# Patient Record
Sex: Female | Born: 1950 | Race: White | Hispanic: No | Marital: Married | Smoking: Former smoker
Health system: Southern US, Community
[De-identification: ages and names within clinical notes are randomized; demographics above are authoritative.]

## PROBLEM LIST (undated history)

## (undated) DIAGNOSIS — D649 Anemia, unspecified: Secondary | ICD-10-CM

## (undated) DIAGNOSIS — R011 Cardiac murmur, unspecified: Secondary | ICD-10-CM

## (undated) DIAGNOSIS — M1990 Unspecified osteoarthritis, unspecified site: Secondary | ICD-10-CM

## (undated) DIAGNOSIS — N189 Chronic kidney disease, unspecified: Secondary | ICD-10-CM

## (undated) DIAGNOSIS — M8580 Other specified disorders of bone density and structure, unspecified site: Secondary | ICD-10-CM

## (undated) DIAGNOSIS — M810 Age-related osteoporosis without current pathological fracture: Secondary | ICD-10-CM

## (undated) DIAGNOSIS — N390 Urinary tract infection, site not specified: Secondary | ICD-10-CM

## (undated) DIAGNOSIS — F32A Depression, unspecified: Secondary | ICD-10-CM

## (undated) DIAGNOSIS — R413 Other amnesia: Secondary | ICD-10-CM

## (undated) DIAGNOSIS — K219 Gastro-esophageal reflux disease without esophagitis: Secondary | ICD-10-CM

## (undated) DIAGNOSIS — F329 Major depressive disorder, single episode, unspecified: Secondary | ICD-10-CM

## (undated) DIAGNOSIS — I10 Essential (primary) hypertension: Secondary | ICD-10-CM

## (undated) DIAGNOSIS — E785 Hyperlipidemia, unspecified: Secondary | ICD-10-CM

## (undated) DIAGNOSIS — E119 Type 2 diabetes mellitus without complications: Secondary | ICD-10-CM

## (undated) DIAGNOSIS — B379 Candidiasis, unspecified: Secondary | ICD-10-CM

## (undated) HISTORY — PX: SPINE SURGERY: SHX786

## (undated) HISTORY — DX: Major depressive disorder, single episode, unspecified: F32.9

## (undated) HISTORY — DX: Unspecified osteoarthritis, unspecified site: M19.90

## (undated) HISTORY — DX: Anemia, unspecified: D64.9

## (undated) HISTORY — DX: Hypercalcemia: E83.52

## (undated) HISTORY — DX: Cardiac murmur, unspecified: R01.1

## (undated) HISTORY — PX: TONSILLECTOMY: SUR1361

## (undated) HISTORY — DX: Age-related osteoporosis without current pathological fracture: M81.0

## (undated) HISTORY — DX: Other specified disorders of bone density and structure, unspecified site: M85.80

## (undated) HISTORY — DX: Type 2 diabetes mellitus without complications: E11.9

## (undated) HISTORY — DX: Depression, unspecified: F32.A

## (undated) HISTORY — PX: BLADDER SUSPENSION: SHX72

## (undated) HISTORY — DX: Essential (primary) hypertension: I10

## (undated) HISTORY — PX: BACK SURGERY: SHX140

## (undated) HISTORY — DX: Gastro-esophageal reflux disease without esophagitis: K21.9

## (undated) HISTORY — DX: Other amnesia: R41.3

## (undated) HISTORY — DX: Hyperlipidemia, unspecified: E78.5

## (undated) HISTORY — PX: CARPAL TUNNEL RELEASE: SHX101

## (undated) HISTORY — PX: OTHER SURGICAL HISTORY: SHX169

---

## 1998-12-12 ENCOUNTER — Other Ambulatory Visit: Admission: RE | Admit: 1998-12-12 | Discharge: 1998-12-12 | Payer: Self-pay | Admitting: Obstetrics and Gynecology

## 1999-03-31 ENCOUNTER — Encounter (INDEPENDENT_AMBULATORY_CARE_PROVIDER_SITE_OTHER): Payer: Self-pay | Admitting: Specialist

## 1999-03-31 ENCOUNTER — Ambulatory Visit (HOSPITAL_COMMUNITY): Admission: RE | Admit: 1999-03-31 | Discharge: 1999-03-31 | Payer: Self-pay | Admitting: Gastroenterology

## 1999-09-23 ENCOUNTER — Emergency Department (HOSPITAL_COMMUNITY): Admission: EM | Admit: 1999-09-23 | Discharge: 1999-09-23 | Payer: Self-pay | Admitting: Emergency Medicine

## 1999-09-23 ENCOUNTER — Encounter: Payer: Self-pay | Admitting: Emergency Medicine

## 1999-10-13 ENCOUNTER — Ambulatory Visit (HOSPITAL_COMMUNITY): Admission: RE | Admit: 1999-10-13 | Discharge: 1999-10-13 | Payer: Self-pay | Admitting: Orthopedic Surgery

## 1999-10-13 ENCOUNTER — Encounter: Payer: Self-pay | Admitting: Orthopedic Surgery

## 1999-10-19 ENCOUNTER — Ambulatory Visit (HOSPITAL_BASED_OUTPATIENT_CLINIC_OR_DEPARTMENT_OTHER): Admission: RE | Admit: 1999-10-19 | Discharge: 1999-10-19 | Payer: Self-pay | Admitting: Orthopedic Surgery

## 2000-07-09 ENCOUNTER — Emergency Department (HOSPITAL_COMMUNITY): Admission: EM | Admit: 2000-07-09 | Discharge: 2000-07-09 | Payer: Self-pay | Admitting: Emergency Medicine

## 2000-08-16 ENCOUNTER — Encounter: Admission: RE | Admit: 2000-08-16 | Discharge: 2000-08-16 | Payer: Self-pay | Admitting: Obstetrics and Gynecology

## 2000-08-16 ENCOUNTER — Encounter: Payer: Self-pay | Admitting: Obstetrics and Gynecology

## 2000-11-26 ENCOUNTER — Other Ambulatory Visit: Admission: RE | Admit: 2000-11-26 | Discharge: 2000-11-26 | Payer: Self-pay | Admitting: Obstetrics and Gynecology

## 2001-01-29 ENCOUNTER — Encounter: Payer: Self-pay | Admitting: Endocrinology

## 2001-01-29 ENCOUNTER — Ambulatory Visit (HOSPITAL_COMMUNITY): Admission: RE | Admit: 2001-01-29 | Discharge: 2001-01-29 | Payer: Self-pay | Admitting: Endocrinology

## 2001-03-10 ENCOUNTER — Encounter: Admission: RE | Admit: 2001-03-10 | Discharge: 2001-06-08 | Payer: Self-pay | Admitting: Endocrinology

## 2001-11-21 ENCOUNTER — Encounter: Payer: Self-pay | Admitting: Obstetrics and Gynecology

## 2001-11-21 ENCOUNTER — Encounter: Admission: RE | Admit: 2001-11-21 | Discharge: 2001-11-21 | Payer: Self-pay | Admitting: Obstetrics and Gynecology

## 2001-12-22 ENCOUNTER — Other Ambulatory Visit: Admission: RE | Admit: 2001-12-22 | Discharge: 2001-12-22 | Payer: Self-pay | Admitting: Obstetrics and Gynecology

## 2003-02-26 ENCOUNTER — Encounter: Admission: RE | Admit: 2003-02-26 | Discharge: 2003-02-26 | Payer: Self-pay | Admitting: Obstetrics and Gynecology

## 2003-02-26 ENCOUNTER — Encounter: Payer: Self-pay | Admitting: Obstetrics and Gynecology

## 2003-03-03 ENCOUNTER — Other Ambulatory Visit: Admission: RE | Admit: 2003-03-03 | Discharge: 2003-03-03 | Payer: Self-pay | Admitting: Obstetrics and Gynecology

## 2004-05-26 ENCOUNTER — Encounter: Admission: RE | Admit: 2004-05-26 | Discharge: 2004-05-26 | Payer: Self-pay | Admitting: Endocrinology

## 2005-07-26 ENCOUNTER — Encounter: Admission: RE | Admit: 2005-07-26 | Discharge: 2005-07-26 | Payer: Self-pay | Admitting: Endocrinology

## 2006-01-17 ENCOUNTER — Emergency Department (HOSPITAL_COMMUNITY): Admission: EM | Admit: 2006-01-17 | Discharge: 2006-01-17 | Payer: Self-pay | Admitting: Emergency Medicine

## 2007-03-20 ENCOUNTER — Encounter: Admission: RE | Admit: 2007-03-20 | Discharge: 2007-03-20 | Payer: Self-pay | Admitting: Endocrinology

## 2007-10-30 ENCOUNTER — Emergency Department (HOSPITAL_COMMUNITY): Admission: EM | Admit: 2007-10-30 | Discharge: 2007-10-30 | Payer: Self-pay | Admitting: Family Medicine

## 2008-04-28 ENCOUNTER — Encounter: Admission: RE | Admit: 2008-04-28 | Discharge: 2008-04-28 | Payer: Self-pay | Admitting: Obstetrics & Gynecology

## 2009-05-12 ENCOUNTER — Encounter: Admission: RE | Admit: 2009-05-12 | Discharge: 2009-05-12 | Payer: Self-pay | Admitting: Endocrinology

## 2009-09-01 ENCOUNTER — Emergency Department (HOSPITAL_COMMUNITY): Admission: EM | Admit: 2009-09-01 | Discharge: 2009-09-01 | Payer: Self-pay | Admitting: Emergency Medicine

## 2010-05-22 ENCOUNTER — Observation Stay (HOSPITAL_COMMUNITY): Admission: EM | Admit: 2010-05-22 | Discharge: 2010-05-24 | Payer: Self-pay | Admitting: Emergency Medicine

## 2010-05-22 ENCOUNTER — Emergency Department (HOSPITAL_COMMUNITY): Admission: EM | Admit: 2010-05-22 | Discharge: 2010-05-22 | Payer: Self-pay | Admitting: Emergency Medicine

## 2010-06-21 ENCOUNTER — Inpatient Hospital Stay (HOSPITAL_COMMUNITY): Admission: RE | Admit: 2010-06-21 | Discharge: 2010-06-22 | Payer: Self-pay | Admitting: *Deleted

## 2010-07-03 ENCOUNTER — Inpatient Hospital Stay (HOSPITAL_COMMUNITY)
Admission: EM | Admit: 2010-07-03 | Discharge: 2010-07-07 | Payer: Self-pay | Source: Home / Self Care | Attending: Endocrinology | Admitting: Endocrinology

## 2010-08-29 ENCOUNTER — Other Ambulatory Visit: Payer: Self-pay | Admitting: Obstetrics & Gynecology

## 2010-08-29 DIAGNOSIS — Z1231 Encounter for screening mammogram for malignant neoplasm of breast: Secondary | ICD-10-CM

## 2010-09-04 ENCOUNTER — Ambulatory Visit
Admission: RE | Admit: 2010-09-04 | Discharge: 2010-09-04 | Disposition: A | Discharge status: Home / Self Care | Payer: BC Managed Care – PPO | Source: Ambulatory Visit | Attending: Obstetrics & Gynecology | Admitting: Obstetrics & Gynecology

## 2010-09-04 DIAGNOSIS — Z1231 Encounter for screening mammogram for malignant neoplasm of breast: Secondary | ICD-10-CM

## 2010-10-02 LAB — GLUCOSE, CAPILLARY
Glucose-Capillary: 161 mg/dL — ABNORMAL HIGH (ref 70–99)
Glucose-Capillary: 174 mg/dL — ABNORMAL HIGH (ref 70–99)
Glucose-Capillary: 194 mg/dL — ABNORMAL HIGH (ref 70–99)

## 2010-10-02 LAB — CBC
MCH: 28.5 pg (ref 26.0–34.0)
MCV: 86.1 fL (ref 78.0–100.0)
Platelets: 440 10*3/uL — ABNORMAL HIGH (ref 150–400)
Platelets: 501 10*3/uL — ABNORMAL HIGH (ref 150–400)
RBC: 3.61 MIL/uL — ABNORMAL LOW (ref 3.87–5.11)
RDW: 12.7 % (ref 11.5–15.5)
WBC: 6.7 10*3/uL (ref 4.0–10.5)

## 2010-10-02 LAB — COMPREHENSIVE METABOLIC PANEL
ALT: 20 U/L (ref 0–35)
AST: 24 U/L (ref 0–37)
Albumin: 3.5 g/dL (ref 3.5–5.2)
Alkaline Phosphatase: 73 U/L (ref 39–117)
Alkaline Phosphatase: 75 U/L (ref 39–117)
BUN: 11 mg/dL (ref 6–23)
CO2: 23 mEq/L (ref 19–32)
Chloride: 112 mEq/L (ref 96–112)
GFR calc Af Amer: >60 mL/min (ref >60)
GFR calc non Af Amer: >60 mL/min (ref >60)
Glucose, Bld: 144 mg/dL — ABNORMAL HIGH (ref 70–99)
Potassium: 3.5 mEq/L (ref 3.5–5.1)
Potassium: 3.5 mEq/L (ref 3.5–5.1)
Sodium: 141 mEq/L (ref 135–145)
Total Bilirubin: 0.5 mg/dL (ref 0.3–1.2)
Total Protein: 5.8 g/dL — ABNORMAL LOW (ref 6.0–8.3)
Total Protein: 6.2 g/dL (ref 6.0–8.3)

## 2010-10-03 LAB — CBC
HCT: 33.1 % — ABNORMAL LOW (ref 36.0–46.0)
Hemoglobin: 11.2 g/dL — ABNORMAL LOW (ref 12.0–15.0)
MCH: 29 pg (ref 26.0–34.0)
MCHC: 33.6 g/dL (ref 30.0–36.0)
Platelets: 475 10*3/uL — ABNORMAL HIGH (ref 150–400)
RBC: 3.85 MIL/uL — ABNORMAL LOW (ref 3.87–5.11)
RDW: 13 % (ref 11.5–15.5)
WBC: 6.1 10*3/uL (ref 4.0–10.5)

## 2010-10-03 LAB — COMPREHENSIVE METABOLIC PANEL
ALT: 21 U/L (ref 0–35)
AST: 16 U/L (ref 0–37)
AST: 21 U/L (ref 0–37)
Albumin: 3.4 g/dL — ABNORMAL LOW (ref 3.5–5.2)
Alkaline Phosphatase: 66 U/L (ref 39–117)
CO2: 26 mEq/L (ref 19–32)
CO2: 26 mEq/L (ref 19–32)
Calcium: 10.7 mg/dL — ABNORMAL HIGH (ref 8.4–10.5)
Creatinine, Ser: 1.02 mg/dL (ref 0.4–1.2)
GFR calc Af Amer: >60 mL/min (ref >60)
GFR calc Af Amer: >60 mL/min (ref >60)
GFR calc non Af Amer: 55 mL/min — ABNORMAL LOW (ref >60)
GFR calc non Af Amer: 57 mL/min — ABNORMAL LOW (ref >60)
Glucose, Bld: 168 mg/dL — ABNORMAL HIGH (ref 70–99)
Potassium: 3.4 mEq/L — ABNORMAL LOW (ref 3.5–5.1)
Sodium: 139 mEq/L (ref 135–145)
Sodium: 139 mEq/L (ref 135–145)
Total Protein: 5.9 g/dL — ABNORMAL LOW (ref 6.0–8.3)

## 2010-10-03 LAB — URINALYSIS, ROUTINE W REFLEX MICROSCOPIC
Bilirubin Urine: NEGATIVE
Glucose, UA: NEGATIVE mg/dL
Hgb urine dipstick: NEGATIVE
Ketones, ur: 15 mg/dL — AB
Ketones, ur: NEGATIVE mg/dL
Protein, ur: NEGATIVE mg/dL
Specific Gravity, Urine: 1.018 (ref 1.005–1.030)
Urobilinogen, UA: 1 mg/dL (ref 0.0–1.0)
pH: 6.5 (ref 5.0–8.0)

## 2010-10-03 LAB — PTH, INTACT AND CALCIUM: PTH: 64.2 pg/mL (ref 14.0–72.0)

## 2010-10-03 LAB — UIFE/LIGHT CHAINS/TP QN, 24-HR UR
Beta, Urine: DETECTED — AB
Free Lambda Excretion/Day: 0.57 mg/d
Free Lambda Lt Chains,Ur: 0.06 mg/dL — ABNORMAL LOW (ref 0.08–1.01)
Free Lt Chn Excr Rate: 11.12 mg/d
Gamma Globulin, Urine: DETECTED — AB
Time: 24 hours
Total Protein, Urine-Ur/day: 53 mg/d (ref 10–140)
Total Protein, Urine: 5.6 mg/dL

## 2010-10-03 LAB — CK TOTAL AND CKMB (NOT AT ARMC)
Relative Index: INVALID (ref 0.0–2.5)
Total CK: 23 U/L (ref 7–177)

## 2010-10-03 LAB — URINE MICROSCOPIC-ADD ON

## 2010-10-03 LAB — VITAMIN D 25 HYDROXY (VIT D DEFICIENCY, FRACTURES): Vit D, 25-Hydroxy: 44 ng/mL (ref 30–89)

## 2010-10-03 LAB — GLUCOSE, CAPILLARY
Glucose-Capillary: 130 mg/dL — ABNORMAL HIGH (ref 70–99)
Glucose-Capillary: 160 mg/dL — ABNORMAL HIGH (ref 70–99)
Glucose-Capillary: 167 mg/dL — ABNORMAL HIGH (ref 70–99)
Glucose-Capillary: 168 mg/dL — ABNORMAL HIGH (ref 70–99)
Glucose-Capillary: 188 mg/dL — ABNORMAL HIGH (ref 70–99)
Glucose-Capillary: 205 mg/dL — ABNORMAL HIGH (ref 70–99)
Glucose-Capillary: 235 mg/dL — ABNORMAL HIGH (ref 70–99)

## 2010-10-03 LAB — DIFFERENTIAL
Basophils Relative: 0 % (ref 0–1)
Eosinophils Absolute: 0.1 10*3/uL (ref 0.0–0.7)
Eosinophils Relative: 1 % (ref 0–5)
Lymphs Abs: 2 10*3/uL (ref 0.7–4.0)
Monocytes Absolute: 0.5 10*3/uL (ref 0.1–1.0)
Neutrophils Relative %: 58 % (ref 43–77)

## 2010-10-03 LAB — PROTEIN ELECTROPH W RFLX QUANT IMMUNOGLOBULINS
Alpha-1-Globulin: 6.1 % — ABNORMAL HIGH (ref 2.9–4.9)
Alpha-2-Globulin: 12.7 % — ABNORMAL HIGH (ref 7.1–11.8)
Beta 2: 5.4 % (ref 3.2–6.5)
Beta Globulin: 6.2 % (ref 4.7–7.2)
Gamma Globulin: 9 % — ABNORMAL LOW (ref 11.1–18.8)
M-Spike, %: NOT DETECTED g/dL

## 2010-10-03 LAB — VITAMIN D 1,25 DIHYDROXY
Vitamin D2 1, 25 (OH)2: 19 pg/mL
Vitamin D3 1, 25 (OH)2: 42 pg/mL

## 2010-10-03 LAB — RAPID URINE DRUG SCREEN, HOSP PERFORMED
Benzodiazepines: POSITIVE — AB
Cocaine: NOT DETECTED
Tetrahydrocannabinol: NOT DETECTED

## 2010-10-03 LAB — URINE CULTURE
Colony Count: 30000
Culture  Setup Time: 201112130315

## 2010-10-03 LAB — CARDIAC PANEL(CRET KIN+CKTOT+MB+TROPI)
CK, MB: 0.9 ng/mL (ref 0.3–4.0)
CK, MB: 1 ng/mL (ref 0.3–4.0)
Relative Index: INVALID (ref 0.0–2.5)
Total CK: 20 U/L (ref 7–177)
Total CK: 21 U/L (ref 7–177)

## 2010-10-04 LAB — COMPREHENSIVE METABOLIC PANEL
Albumin: 3.7 g/dL (ref 3.5–5.2)
Albumin: 4.3 g/dL (ref 3.5–5.2)
Alkaline Phosphatase: 47 U/L (ref 39–117)
Alkaline Phosphatase: 61 U/L (ref 39–117)
BUN: 12 mg/dL (ref 6–23)
BUN: 12 mg/dL (ref 6–23)
BUN: 14 mg/dL (ref 6–23)
CO2: 23 mEq/L (ref 19–32)
Creatinine, Ser: 1.02 mg/dL (ref 0.4–1.2)
Creatinine, Ser: 1.05 mg/dL (ref 0.4–1.2)
GFR calc Af Amer: >60 mL/min (ref >60)
GFR calc non Af Amer: 53 mL/min — ABNORMAL LOW (ref >60)
Glucose, Bld: 142 mg/dL — ABNORMAL HIGH (ref 70–99)
Potassium: 3.8 mEq/L (ref 3.5–5.1)
Potassium: 4.1 mEq/L (ref 3.5–5.1)
Total Bilirubin: 0.6 mg/dL (ref 0.3–1.2)
Total Bilirubin: 0.7 mg/dL (ref 0.3–1.2)
Total Protein: 6 g/dL (ref 6.0–8.3)
Total Protein: 6.9 g/dL (ref 6.0–8.3)
Total Protein: 7 g/dL (ref 6.0–8.3)

## 2010-10-04 LAB — CBC
HCT: 40.7 % (ref 36.0–46.0)
MCH: 29.4 pg (ref 26.0–34.0)
MCV: 85.1 fL (ref 78.0–100.0)
MCV: 85.5 fL (ref 78.0–100.0)
MCV: 86.8 fL (ref 78.0–100.0)
Platelets: 343 10*3/uL (ref 150–400)
Platelets: 364 10*3/uL (ref 150–400)
RDW: 12.4 % (ref 11.5–15.5)
RDW: 12.7 % (ref 11.5–15.5)
RDW: 12.7 % (ref 11.5–15.5)
WBC: 7.5 10*3/uL (ref 4.0–10.5)
WBC: 8.4 10*3/uL (ref 4.0–10.5)

## 2010-10-04 LAB — GLUCOSE, CAPILLARY
Glucose-Capillary: 129 mg/dL — ABNORMAL HIGH (ref 70–99)
Glucose-Capillary: 153 mg/dL — ABNORMAL HIGH (ref 70–99)
Glucose-Capillary: 212 mg/dL — ABNORMAL HIGH (ref 70–99)
Glucose-Capillary: 223 mg/dL — ABNORMAL HIGH (ref 70–99)
Glucose-Capillary: 85 mg/dL (ref 70–99)

## 2010-10-04 LAB — POCT I-STAT 4, (NA,K, GLUC, HGB,HCT)
Potassium: 3.9 mEq/L (ref 3.5–5.1)
Sodium: 142 mEq/L (ref 135–145)

## 2010-10-04 LAB — DIFFERENTIAL
Basophils Absolute: 0 10*3/uL (ref 0.0–0.1)
Basophils Absolute: 0 10*3/uL (ref 0.0–0.1)
Basophils Relative: 0 % (ref 0–1)
Lymphocytes Relative: 34 % (ref 12–46)
Monocytes Absolute: 0.3 10*3/uL (ref 0.1–1.0)
Monocytes Relative: 5 % (ref 3–12)
Monocytes Relative: 8 % (ref 3–12)
Neutro Abs: 3.5 10*3/uL (ref 1.7–7.7)
Neutro Abs: 5.2 10*3/uL (ref 1.7–7.7)
Neutrophils Relative %: 69 % (ref 43–77)

## 2010-10-04 LAB — URINALYSIS, ROUTINE W REFLEX MICROSCOPIC
Hgb urine dipstick: NEGATIVE
Ketones, ur: NEGATIVE mg/dL
Nitrite: NEGATIVE
Protein, ur: NEGATIVE mg/dL
Specific Gravity, Urine: 1.007 (ref 1.005–1.030)
Urobilinogen, UA: 0.2 mg/dL (ref 0.0–1.0)
Urobilinogen, UA: 0.2 mg/dL (ref 0.0–1.0)
pH: 6 (ref 5.0–8.0)

## 2010-10-04 LAB — CULTURE, BLOOD (ROUTINE X 2)
Culture  Setup Time: 201110312300
Culture  Setup Time: 201110312300

## 2010-10-04 LAB — URINE MICROSCOPIC-ADD ON

## 2010-10-04 LAB — APTT: aPTT: 28 seconds (ref 24–37)

## 2010-10-04 LAB — TYPE AND SCREEN: ABO/RH(D): O NEG

## 2010-10-04 LAB — RAPID URINE DRUG SCREEN, HOSP PERFORMED
Amphetamines: NOT DETECTED
Barbiturates: NOT DETECTED
Benzodiazepines: POSITIVE — AB
Opiates: NOT DETECTED

## 2010-10-04 LAB — URINE CULTURE: Colony Count: NO GROWTH

## 2010-10-04 LAB — SURGICAL PCR SCREEN: Staphylococcus aureus: NEGATIVE

## 2010-10-04 LAB — AMMONIA: Ammonia: 42 umol/L — ABNORMAL HIGH (ref 11–35)

## 2010-10-04 LAB — POCT CARDIAC MARKERS: Troponin i, poc: <0.05 ng/mL (ref 0.00–0.09)

## 2010-12-08 NOTE — Op Note (Signed)
[place]  Patient:    Casey Jennings, Casey Jennings                     MRN: [id number] Proc. Date: 10/19/99 Attending:  Josephine Cables., M.D.                           Operative Report  INDICATIONS:  This patient is a 59 year old who had a slightly displaced fibula distal tibia fracture that on followup examination some two-and-a-half to three weeks after initial contact with the patient showed enough displacement to merit open reduction, possibly requiring one night in a hospital setting.  PREOPERATIVE DIAGNOSIS:  Displaced distal tibial fracture and lateral malleus fracture.  POSTOPERATIVE DIAGNOSIS:  Displaced distal tibial fracture and lateral malleus fracture.  PROCEDURE: 1. Open reduction internal fixation lateral malleus. 2. Open reduction internal fixation of distal tibia.  SURGEON:  Josephine Cables., M.D.  ANESTHESIA:  General.  TOURNIQUET TIME:  Approximately one hour ten minutes.  DESCRIPTION OF PROCEDURE:  The patient had sterile prep and drape, ______ . Placed the tourniquet at 375 mmHg.  A lateral incision was made slightly posterior to the normal fibular incision to allow approach or at least inspection of the posterior aspect of the tibia.  The fracture had some early callous formation on the fibular side.  This was gently debrided followed by provisional reduction.  On provisional reduction, the slight superior inferior displacement of the posterior tibial fracture corrected nicely; therefore, through a separate small stab wound along the anterior lateral aspect of the tibia, a cancellous 4.0 lag screw was placed from front to back to compress the separation between the distal tibia fragments.  It was obvious on the fluoroscopy that the compression had occurred, and the end of the screw was not prominent posteriorly, but it did engage the posterior cortex and compress the posterior aspect of the tibia nicely.   Thereafter, a six-hole plate was fashioned using the Ace system, and again, a 4.0 Ace titanium screw was used, three screws proximally, but well, only one screw distally could be placed not entering the fracture site.  A cancellous screw was placed which bit well distally in view of the fact that there was a long posterior fragment.  A lag screw was placed from the proximal to the distal fracture site of the fibula to create a lag screw affect through the fracture site.  This compressed this area nicely.  Mortise was well reduced on the AP lateral and mortise views. Basically, an anatomic reduction.  The wound was irrigated and closed, the stab wound with nylon, the lateral incision with #0, 2-0 Vicryl and skin clips.  Lightly compressive sterile dressing applied with a ______ neutral. Back to recovery with stable conditions. DD:  10/19/99 TD:  10/20/99 Job: [id number]

## 2011-05-31 ENCOUNTER — Ambulatory Visit (HOSPITAL_COMMUNITY)
Admission: RE | Admit: 2011-05-31 | Discharge: 2011-05-31 | Disposition: A | Discharge status: Home / Self Care | Payer: BC Managed Care – PPO | Source: Ambulatory Visit | Attending: Endocrinology | Admitting: Endocrinology

## 2011-05-31 ENCOUNTER — Other Ambulatory Visit: Payer: Self-pay | Admitting: Endocrinology

## 2011-05-31 ENCOUNTER — Other Ambulatory Visit (HOSPITAL_COMMUNITY): Payer: Self-pay | Admitting: Endocrinology

## 2011-05-31 DIAGNOSIS — R42 Dizziness and giddiness: Secondary | ICD-10-CM

## 2011-05-31 MED ORDER — GADOBENATE DIMEGLUMINE 529 MG/ML IV SOLN
16.0000 mL | Freq: Once | INTRAVENOUS | Status: AC | PRN
  Administered 2011-05-31: 16 mL via INTRAVENOUS

## 2011-06-19 ENCOUNTER — Ambulatory Visit: Payer: BC Managed Care – PPO | Attending: Neurology | Admitting: Physical Therapy

## 2011-06-19 DIAGNOSIS — IMO0001 Reserved for inherently not codable concepts without codable children: Secondary | ICD-10-CM | POA: Insufficient documentation

## 2011-06-19 DIAGNOSIS — H8110 Benign paroxysmal vertigo, unspecified ear: Secondary | ICD-10-CM | POA: Insufficient documentation

## 2011-06-25 ENCOUNTER — Encounter: Payer: BC Managed Care – PPO | Admitting: Physical Therapy

## 2011-06-27 ENCOUNTER — Ambulatory Visit: Payer: BC Managed Care – PPO | Attending: Neurology | Admitting: Physical Therapy

## 2011-06-27 DIAGNOSIS — IMO0001 Reserved for inherently not codable concepts without codable children: Secondary | ICD-10-CM | POA: Insufficient documentation

## 2011-06-27 DIAGNOSIS — H8110 Benign paroxysmal vertigo, unspecified ear: Secondary | ICD-10-CM | POA: Insufficient documentation

## 2011-06-29 ENCOUNTER — Ambulatory Visit: Payer: BC Managed Care – PPO | Admitting: Physical Therapy

## 2011-07-04 ENCOUNTER — Encounter: Payer: BC Managed Care – PPO | Admitting: Physical Therapy

## 2011-07-06 ENCOUNTER — Ambulatory Visit: Payer: BC Managed Care – PPO | Admitting: Physical Therapy

## 2011-07-09 ENCOUNTER — Ambulatory Visit: Payer: BC Managed Care – PPO | Admitting: Physical Therapy

## 2011-07-10 ENCOUNTER — Encounter: Payer: BC Managed Care – PPO | Admitting: Physical Therapy

## 2011-07-13 ENCOUNTER — Encounter: Payer: BC Managed Care – PPO | Admitting: Physical Therapy

## 2011-07-16 ENCOUNTER — Encounter: Payer: BC Managed Care – PPO | Admitting: Physical Therapy

## 2011-07-18 ENCOUNTER — Encounter: Payer: BC Managed Care – PPO | Admitting: Physical Therapy

## 2011-07-27 ENCOUNTER — Encounter: Payer: BC Managed Care – PPO | Admitting: Physical Therapy

## 2011-07-30 ENCOUNTER — Encounter: Payer: BC Managed Care – PPO | Admitting: Physical Therapy

## 2011-08-02 ENCOUNTER — Encounter: Payer: BC Managed Care – PPO | Admitting: Physical Therapy

## 2011-08-07 ENCOUNTER — Encounter: Payer: BC Managed Care – PPO | Admitting: Physical Therapy

## 2011-08-09 ENCOUNTER — Encounter: Payer: BC Managed Care – PPO | Admitting: Physical Therapy

## 2011-08-14 ENCOUNTER — Other Ambulatory Visit: Payer: Self-pay | Admitting: Endocrinology

## 2011-08-14 DIAGNOSIS — C73 Malignant neoplasm of thyroid gland: Secondary | ICD-10-CM

## 2011-08-23 ENCOUNTER — Other Ambulatory Visit: Payer: BC Managed Care – PPO

## 2011-10-25 ENCOUNTER — Other Ambulatory Visit: Payer: Self-pay | Admitting: Obstetrics & Gynecology

## 2011-10-25 DIAGNOSIS — Z1231 Encounter for screening mammogram for malignant neoplasm of breast: Secondary | ICD-10-CM

## 2011-10-31 ENCOUNTER — Ambulatory Visit: Payer: BC Managed Care – PPO

## 2011-10-31 ENCOUNTER — Other Ambulatory Visit: Payer: BC Managed Care – PPO

## 2011-11-09 ENCOUNTER — Ambulatory Visit
Admission: RE | Admit: 2011-11-09 | Discharge: 2011-11-09 | Disposition: A | Discharge status: Home / Self Care | Payer: BC Managed Care – PPO | Source: Ambulatory Visit | Attending: Obstetrics & Gynecology | Admitting: Obstetrics & Gynecology

## 2011-11-09 DIAGNOSIS — Z1231 Encounter for screening mammogram for malignant neoplasm of breast: Secondary | ICD-10-CM

## 2011-11-12 ENCOUNTER — Other Ambulatory Visit: Payer: Self-pay | Admitting: Obstetrics & Gynecology

## 2011-11-12 DIAGNOSIS — R928 Other abnormal and inconclusive findings on diagnostic imaging of breast: Secondary | ICD-10-CM

## 2011-11-14 ENCOUNTER — Ambulatory Visit
Admission: RE | Admit: 2011-11-14 | Discharge: 2011-11-14 | Disposition: A | Discharge status: Home / Self Care | Payer: BC Managed Care – PPO | Source: Ambulatory Visit | Attending: Obstetrics & Gynecology | Admitting: Obstetrics & Gynecology

## 2011-11-14 DIAGNOSIS — R928 Other abnormal and inconclusive findings on diagnostic imaging of breast: Secondary | ICD-10-CM

## 2012-04-04 ENCOUNTER — Other Ambulatory Visit (HOSPITAL_COMMUNITY): Payer: Self-pay | Admitting: Endocrinology

## 2012-04-04 DIAGNOSIS — E349 Endocrine disorder, unspecified: Secondary | ICD-10-CM

## 2012-04-14 ENCOUNTER — Encounter (HOSPITAL_COMMUNITY)
Admission: RE | Admit: 2012-04-14 | Discharge: 2012-04-14 | Disposition: A | Discharge status: Home / Self Care | Payer: BC Managed Care – PPO | Source: Ambulatory Visit | Attending: Endocrinology | Admitting: Endocrinology

## 2012-04-14 DIAGNOSIS — E213 Hyperparathyroidism, unspecified: Secondary | ICD-10-CM | POA: Insufficient documentation

## 2012-04-14 DIAGNOSIS — E349 Endocrine disorder, unspecified: Secondary | ICD-10-CM

## 2012-04-14 MED ORDER — TECHNETIUM TC 99M SESTAMIBI GENERIC - CARDIOLITE
25.3000 | Freq: Once | INTRAVENOUS | Status: AC | PRN
  Administered 2012-04-14: 25 via INTRAVENOUS

## 2012-05-07 ENCOUNTER — Encounter (INDEPENDENT_AMBULATORY_CARE_PROVIDER_SITE_OTHER): Payer: Self-pay | Admitting: Surgery

## 2012-05-12 ENCOUNTER — Encounter (INDEPENDENT_AMBULATORY_CARE_PROVIDER_SITE_OTHER): Payer: Self-pay | Admitting: Surgery

## 2012-05-12 ENCOUNTER — Ambulatory Visit (INDEPENDENT_AMBULATORY_CARE_PROVIDER_SITE_OTHER): Payer: BC Managed Care – PPO | Admitting: Surgery

## 2012-05-12 ENCOUNTER — Telehealth (INDEPENDENT_AMBULATORY_CARE_PROVIDER_SITE_OTHER): Payer: Self-pay | Admitting: General Surgery

## 2012-05-12 VITALS — BP 122/66 | HR 108 | Temp 97.6°F | Resp 16 | Ht 64.5 in | Wt 149.6 lb

## 2012-05-12 DIAGNOSIS — E210 Primary hyperparathyroidism: Secondary | ICD-10-CM

## 2012-05-12 NOTE — Progress Notes (Signed)
General Surgery [facility]  Chief Complaint  Patient presents with  . New Evaluation    eval parathyroid adenoma - referral from Dr. Carrolyn Meiers    HISTORY: Patient is a 61 year old white female referred by her endocrinologist with hypercalcemia and primary hyperparathyroidism. Patient has had problems dating back to 2011 following low back surgery. She developed memory loss and significant hypertension. She has had episodes of hypercalcemia with significant confusion requiring hospitalization. Workup has shown calcium levels to exceed 12.0. Recent levels were elevated at 10.7. Intact PTH level was elevated at 102.  Patient underwent nuclear medicine parathyroid scan in September 2013. This localizes a parathyroid adenoma to the left inferior position. Patient is referred at this time for consideration for parathyroidectomy.  Patient has had no prior head or neck surgery with the exception of tonsillectomy. She denies any other significant endocrinopathy except for diabetes. There is no family history of parathyroid disease or other endocrinopathy.  Past Medical History  Diagnosis Date  . Hyperlipidemia   . Hypertension   . GERD (gastroesophageal reflux disease)   . Anemia   . Hypercalcemia   . Diabetes mellitus without complication   . Depression   . Osteopenia   . Arthritis   . Heart murmur   . Osteoporosis      Current Outpatient Prescriptions  Medication Sig Dispense Refill  . aspirin 81 MG tablet Take 81 mg by mouth daily.      Marland Kitchen atorvastatin (LIPITOR) 80 MG tablet Take 80 mg by mouth daily.      . Choline Fenofibrate (TRILIPIX) 135 MG capsule Take 135 mg by mouth daily.      Marland Kitchen dexlansoprazole (DEXILANT) 60 MG capsule Take 60 mg by mouth daily.      . DULoxetine (CYMBALTA) 60 MG capsule Take 60 mg by mouth daily.      Marland Kitchen estradiol (VAGIFEM) 25 MCG vaginal tablet Place 25 mcg vaginally daily.      . hydrALAZINE (APRESOLINE) 50 MG tablet Take 50 mg by  mouth 3 (three) times daily.      Marland Kitchen linagliptin (TRADJENTA) 5 MG TABS tablet Take 5 mg by mouth daily.      . metoCLOPramide (REGLAN) 10 MG tablet Take 10 mg by mouth at bedtime.      . Multiple Vitamins-Minerals (MULTIVITAMIN WITH MINERALS) tablet Take 1 tablet by mouth daily.      . risedronate (ACTONEL) 150 MG tablet Take 150 mg by mouth every 30 (thirty) days. with water on empty stomach, nothing by mouth or lie down for next 30 minutes.         Allergies  Allergen Reactions  . Codeine     hallucinations     Family History  Problem Relation Age of Onset  . Diabetes Mother      History   Social History  . Marital Status: Married    Spouse Name: N/A    Number of Children: N/A  . Years of Education: N/A   Social History Main Topics  . Smoking status: Former Smoker    Quit date: 05/07/1978  . Smokeless tobacco: None  . Alcohol Use: Yes  . Drug Use: No  . Sexually Active: None   Other Topics Concern  . None   Social History Narrative  . None     REVIEW OF SYSTEMS - PERTINENT POSITIVES ONLY: Patient notes chronic fatigue. She has problems with memory. She frequently has significant confusion. She has speech difficulties. She has been documented  to have osteopenia by bone density scanning. She denies nephrolithiasis.  EXAM: Filed Vitals:   05/12/12 1403  BP: 122/66  Pulse: 108  Temp: 97.6 F (36.4 C)  Resp: 16    HEENT: normocephalic; pupils equal and reactive; sclerae clear; dentition good; mucous membranes moist NECK:  symmetric on extension; no palpable anterior or posterior cervical lymphadenopathy; no supraclavicular masses; no tenderness CHEST: clear to auscultation bilaterally without rales, rhonchi, or wheezes CARDIAC: regular rate and rhythm without significant murmur; peripheral pulses are full EXT:  non-tender without edema; no deformity NEURO: no gross focal deficits; no sign of tremor   LABORATORY RESULTS: See [facility] (CHL-Epic) for  most recent results   RADIOLOGY RESULTS: See [facility] (CHL-Epic) for most recent results   IMPRESSION: #1 primary hyperparathyroidism, 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 I have provided her with written literature on parathyroid disease to review. The nuclear medicine parathyroid scan localizes in adenoma to the left inferior position. I have recommended minimally invasive parathyroidectomy as an outpatient surgical procedure. We discussed the location of the surgical incision, the hospitalization, and the postoperative recovery to be anticipated. We discussed potential complications including the potential for a second gland adenoma and the possible need for additional surgery. The patient understands and wishes to proceed. We will make arrangements for surgery at a time convenient for her in the near future.  The risks and benefits of the procedure have been discussed at length with the patient.  The patient understands the proposed procedure, potential alternative treatments, and the course of recovery to be expected.  All of the patient's questions have been answered at this time.  The patient wishes to proceed with surgery.  Earnstine Regal, MD, [facility]   Visit Diagnoses: 1. Hyperparathyroidism, primary     Primary Care Physician: Sheela Stack, MD

## 2012-05-12 NOTE — Telephone Encounter (Signed)
Called and left message on patient's home and cell phone to try to advise of post op appointment set up to see Dr. Harlow Asa. Patient set up for 06/12/12 at 2:45. Sx scheduled for 05/27/12.

## 2012-05-12 NOTE — Patient Instructions (Addendum)
[facility], [place] [phone]  THYROID & PARATHYROID SURGERY -- POST OP INSTRUCTIONS  Always review your discharge instruction sheet from the facility where your surgery was performed.  1. A prescription for pain medication may be given to you upon discharge.  Take your pain medication as prescribed.  If narcotic pain medicine is not needed, then you may take acetaminophen (Tylenol) or ibuprofen (Advil) as needed. 2. Take your usually prescribed medications unless otherwise directed. 3. If you need a refill on your pain medication, please contact your pharmacy. They will contact our office to request authorization.  Prescriptions will not be processed after 5 pm or on weekends. 4. Start with a light diet upon arrival home, such as soup and crackers or toast.  Be sure to drink plenty of fluids daily.  Resume your normal diet the day after surgery. 5. Most patients will experience some swelling and bruising on the chest and neck area.  Ice packs will help.  Swelling and bruising can take several days to resolve.  6. It is common to experience some constipation if taking pain medication after surgery.  Increasing fluid intake and taking a stool softener will usually help or prevent this problem.  A mild laxative (Milk of Magnesia or Miralax) should be taken according to package directions if there are no bowel movements after 48 hours. 7. You may remove your bandages 24-48 hours after surgery, and you may shower at that time.  You have steri-strips (small skin tapes) in place directly over the incision.  These strips should be left on the skin for 7-10 days and then removed. 8. You may resume regular (light) daily activities beginning the next day-such as daily self-care, walking, climbing stairs-gradually increasing activities as tolerated.  You may have sexual intercourse when it is comfortable.  Refrain from any heavy lifting or straining until approved by your doctor.  You may drive when  you no longer are taking prescription pain medication, you can comfortably wear a seatbelt, and you can safely maneuver your car and apply brakes. 9. You should see your doctor in the office for a follow-up appointment approximately two to three weeks after your surgery.  Make sure that you call for this appointment within a day or two after you arrive home to insure a convenient appointment time.  WHEN TO CALL YOUR DOCTOR: 1. Fever greater than 101.5 2. Inability to urinate 3. Nausea and/or vomiting - persistent 4. Extreme swelling or bruising 5. Continued bleeding from incision 6. Increased pain, redness, or drainage from the incision 7. Difficulty swallowing or breathing 8. Muscle cramping or spasms 9. Numbness or tingling in hands or around lips  The clinic staff is available to answer your questions during regular business hours.  Please don't hesitate to call and ask to speak to one of the nurses if you have concerns. [facility], [place]  [email or web address]

## 2012-05-14 NOTE — Telephone Encounter (Signed)
Patient called back. Made her aware of appt. She will call with any questions.

## 2012-05-20 NOTE — Patient Instructions (Signed)
20 Casey Jennings  05/20/2012   Your procedure is scheduled on: 05/27/12 0930am-1100am  Report to [facility] at 0700 AM.  Call this number if you have problems the morning of surgery: [phone]   Remember:   Do not eat food:After Midnight.  May have clear liquids:until Midnight .    Take these medicines the morning of surgery with A SIP OF WATER:    Do not wear jewelry, make-up or nail polish.  Do not wear lotions, powders, or perfumes. .  Do not shave 48 hours prior to surgery.   Do not bring valuables to the hospital.  Contacts, dentures or bridgework may not be worn into surgery.  .   Patients discharged the day of surgery will not be allowed to drive home.  Name and phone number of your driver             SEE CHG INSTRUCTION SHEET    Please read over the following fact sheets that you were given: MRSA Information, coughing and deep breathing exercises, leg exercises

## 2012-05-21 ENCOUNTER — Encounter (HOSPITAL_COMMUNITY): Payer: Self-pay | Admitting: Pharmacy Technician

## 2012-05-23 ENCOUNTER — Encounter (HOSPITAL_COMMUNITY): Payer: Self-pay

## 2012-05-23 ENCOUNTER — Encounter (HOSPITAL_COMMUNITY)
Admission: RE | Admit: 2012-05-23 | Discharge: 2012-05-23 | Disposition: A | Discharge status: Home / Self Care | Payer: BC Managed Care – PPO | Source: Ambulatory Visit | Attending: Surgery | Admitting: Surgery

## 2012-05-23 ENCOUNTER — Ambulatory Visit (HOSPITAL_COMMUNITY)
Admission: RE | Admit: 2012-05-23 | Discharge: 2012-05-23 | Disposition: A | Discharge status: Home / Self Care | Payer: BC Managed Care – PPO | Source: Ambulatory Visit | Attending: Surgery | Admitting: Surgery

## 2012-05-23 DIAGNOSIS — Z01818 Encounter for other preprocedural examination: Secondary | ICD-10-CM | POA: Insufficient documentation

## 2012-05-23 HISTORY — DX: Chronic kidney disease, unspecified: N18.9

## 2012-05-23 HISTORY — DX: Candidiasis, unspecified: B37.9

## 2012-05-23 LAB — BASIC METABOLIC PANEL
Chloride: 107 mEq/L (ref 96–112)
GFR calc Af Amer: 68 mL/min — ABNORMAL LOW (ref >90)
GFR calc non Af Amer: 59 mL/min — ABNORMAL LOW (ref >90)
Potassium: 3.5 mEq/L (ref 3.5–5.1)
Sodium: 140 mEq/L (ref 135–145)

## 2012-05-23 LAB — CBC
HCT: 33.6 % — ABNORMAL LOW (ref 36.0–46.0)
Hemoglobin: 11.2 g/dL — ABNORMAL LOW (ref 12.0–15.0)
RBC: 3.87 MIL/uL (ref 3.87–5.11)

## 2012-05-23 LAB — SURGICAL PCR SCREEN: MRSA, PCR: NEGATIVE

## 2012-05-26 IMAGING — CT CT HEAD W/O CM
1 series · 16 of 30 positions shown, 20 images · non-contrast
Comparison: None

CLINICAL DATA: Altered mental status.  Headache.

CT HEAD WITHOUT CONTRAST
TECHNIQUE: Contiguous axial images were obtained from the base of
the skull through the vertex without contrast.

[Series 2: (id) head 4.8 h37s st · axial · 0.49mm/px · z∈[-33,+105]mm · 16 of 30 slices shown, 20 images]
[im 2/30  brain]
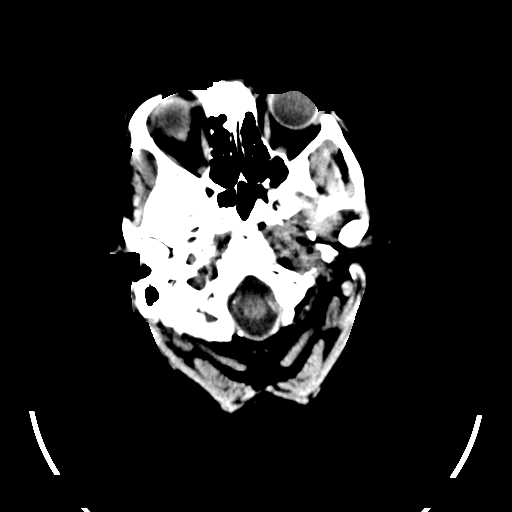
[im 2/30  bone]
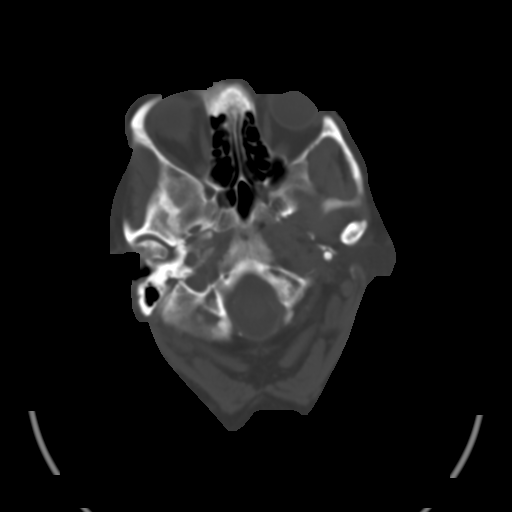
[im 4/30  brain]
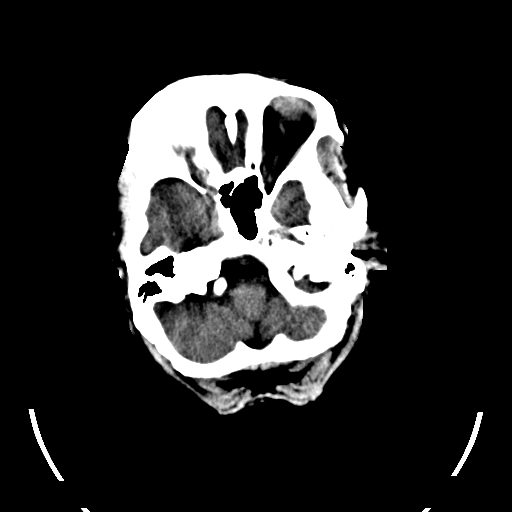
[im 6/30  brain]
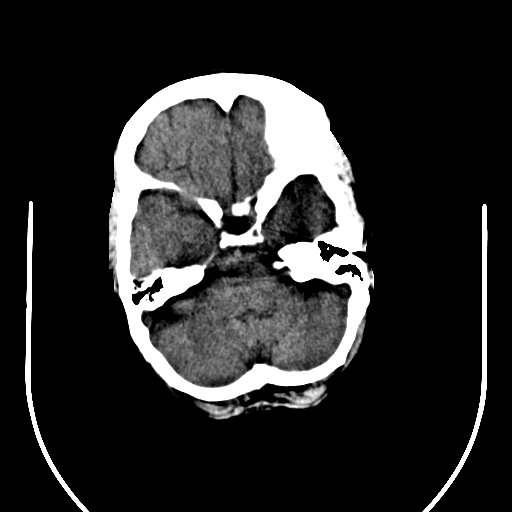
[im 8/30  brain]
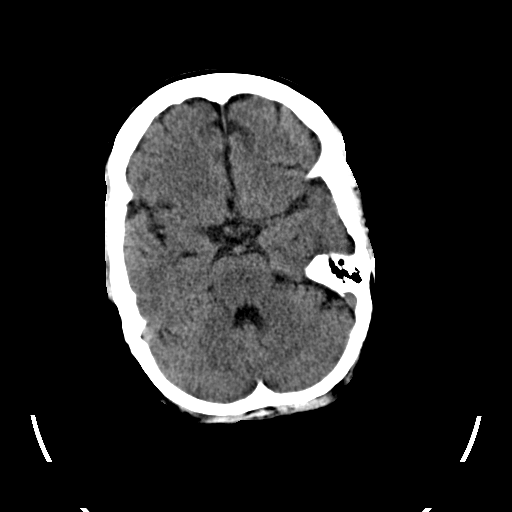
[im 9/30  brain]
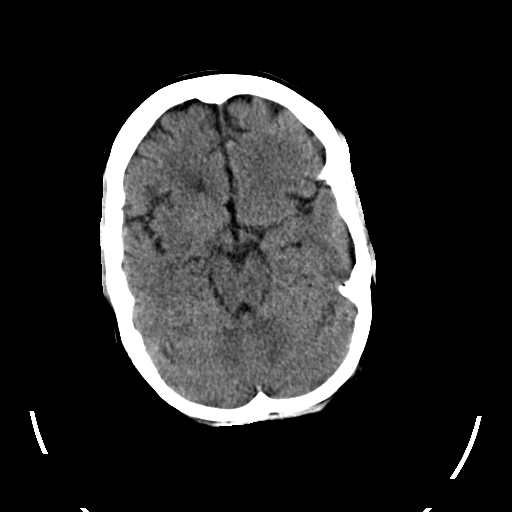
[im 9/30  bone]
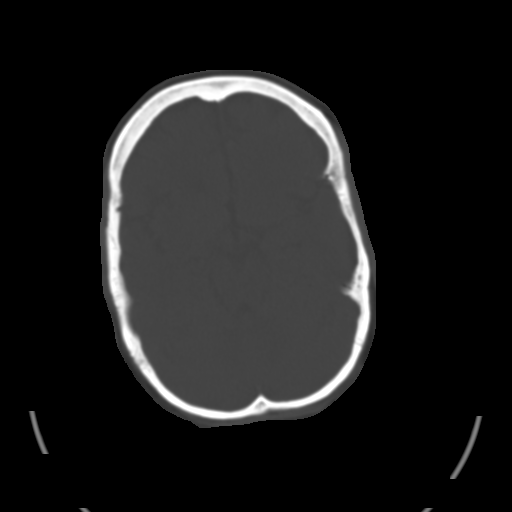
[im 11/30  brain]
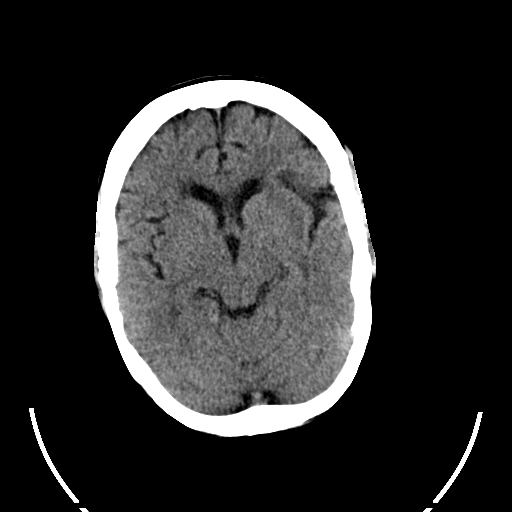
[im 13/30  brain]
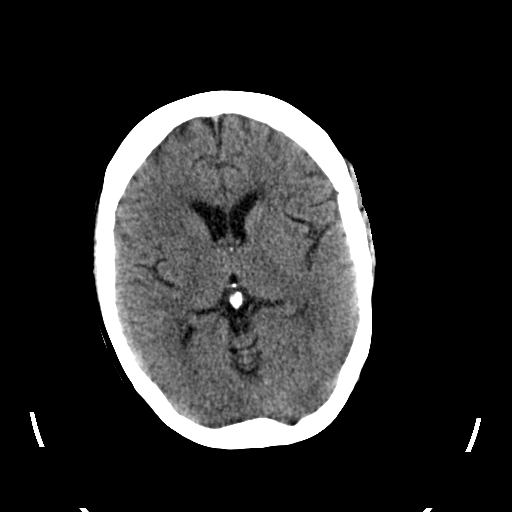
[im 15/30  brain]
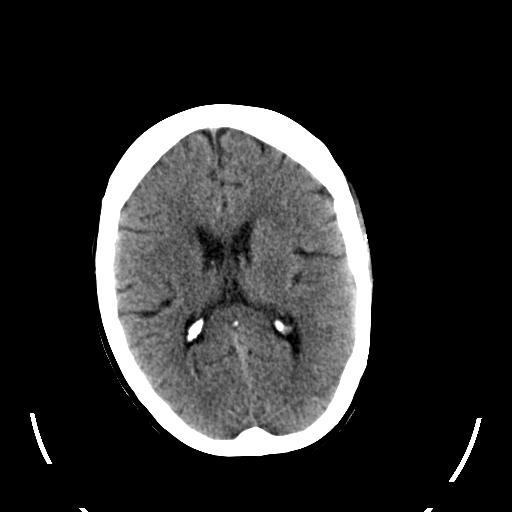
[im 16/30  brain]
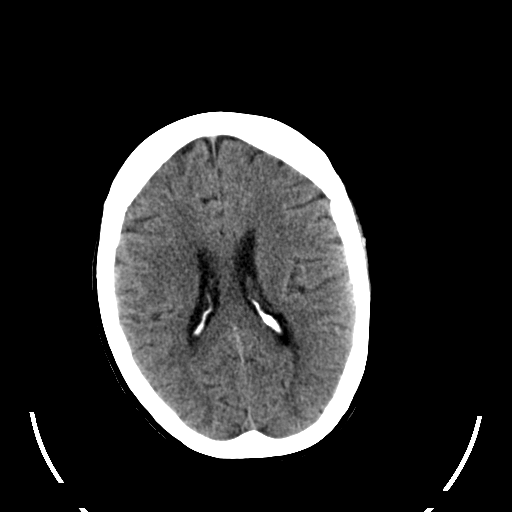
[im 16/30  bone]
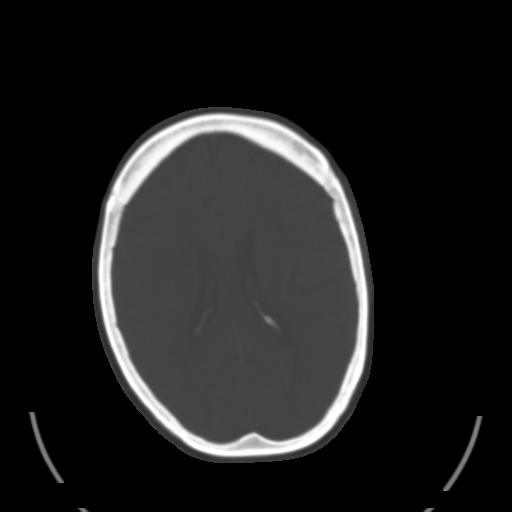
[im 18/30  brain]
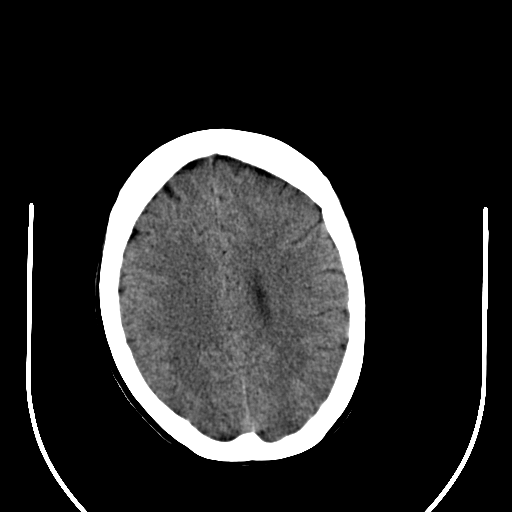
[im 20/30  brain]
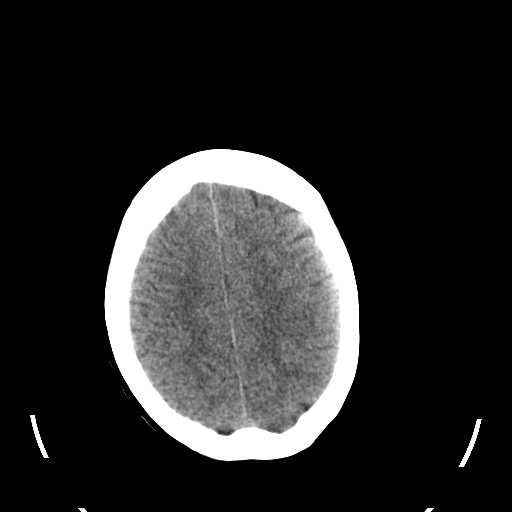
[im 22/30  brain]
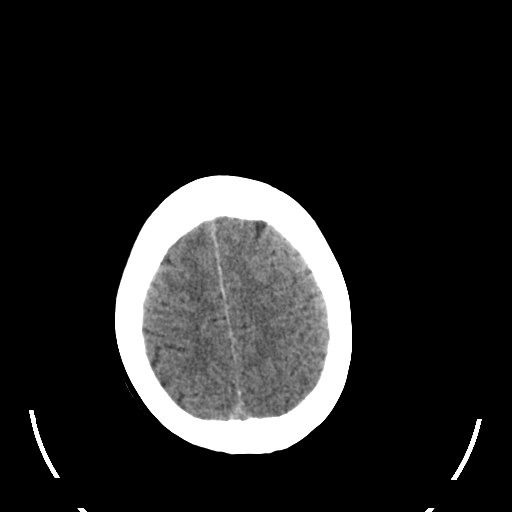
[im 23/30  brain]
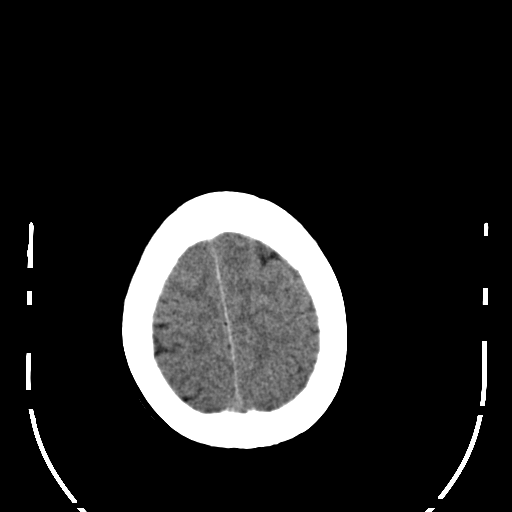
[im 23/30  bone]
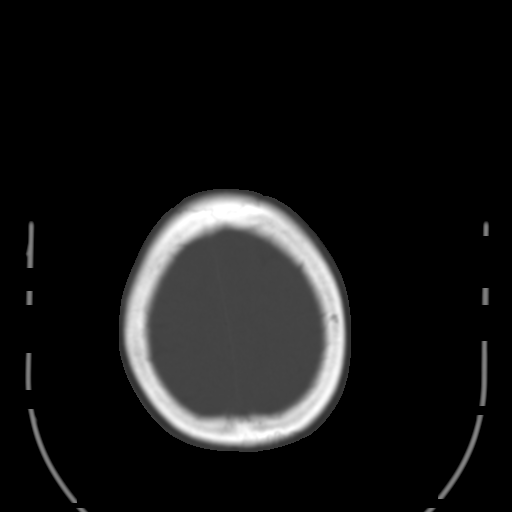
[im 25/30  brain]
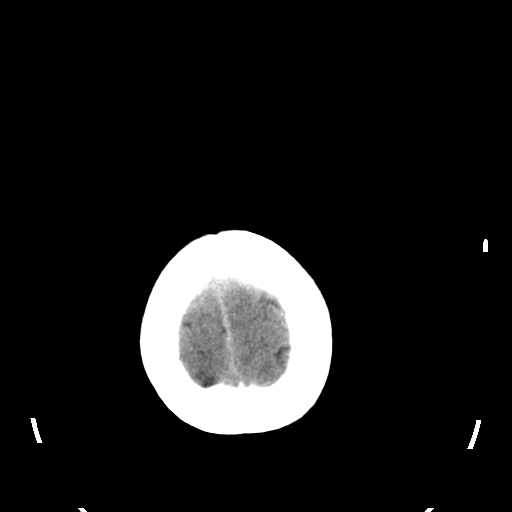
[im 27/30  brain]
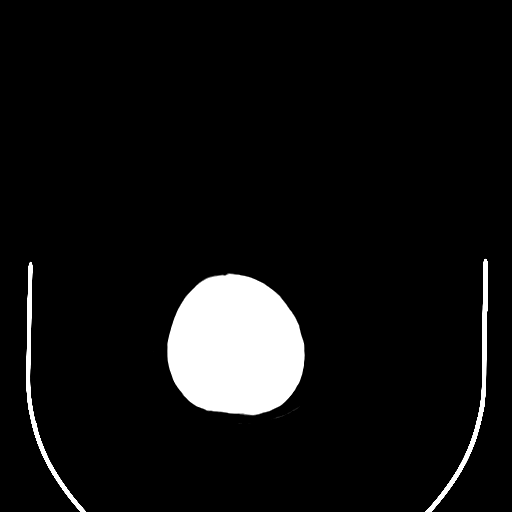
[im 29/30  brain]
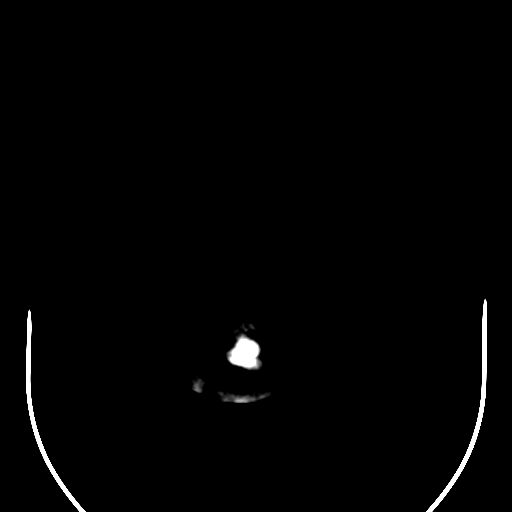

[16 of 30 positions shown; findings below may reference images not displayed]

FINDINGS: Ventricle size is normal.  Age appropriate atrophy.  Mild
chronic microvascular ischemia in the white matter.  Negative for
hemorrhage or mass.  No acute infarct.  Calvarium is intact.
IMPRESSION: Chronic microvascular ischemia in the white matter.  No acute
abnormality.

## 2012-05-26 NOTE — Progress Notes (Signed)
Quick Note:  These results are acceptable for scheduled surgery.  Yvonne Petite M. Shaqueta Casady, MD, FACS [facility] Office: [phone]   ______ 

## 2012-05-27 ENCOUNTER — Ambulatory Visit (HOSPITAL_COMMUNITY): Payer: BC Managed Care – PPO | Admitting: Certified Registered Nurse Anesthetist

## 2012-05-27 ENCOUNTER — Encounter (HOSPITAL_COMMUNITY)
Admission: RE | Disposition: A | Discharge status: Home / Self Care | Payer: Self-pay | Source: Ambulatory Visit | Attending: Surgery

## 2012-05-27 ENCOUNTER — Other Ambulatory Visit (INDEPENDENT_AMBULATORY_CARE_PROVIDER_SITE_OTHER): Payer: Self-pay

## 2012-05-27 ENCOUNTER — Encounter (HOSPITAL_COMMUNITY): Payer: Self-pay

## 2012-05-27 ENCOUNTER — Telehealth (INDEPENDENT_AMBULATORY_CARE_PROVIDER_SITE_OTHER): Payer: Self-pay

## 2012-05-27 ENCOUNTER — Encounter (HOSPITAL_COMMUNITY): Payer: Self-pay | Admitting: Certified Registered Nurse Anesthetist

## 2012-05-27 ENCOUNTER — Ambulatory Visit (HOSPITAL_COMMUNITY)
Admission: RE | Admit: 2012-05-27 | Discharge: 2012-05-27 | Disposition: A | Discharge status: Home / Self Care | Payer: BC Managed Care – PPO | Source: Ambulatory Visit | Attending: Surgery | Admitting: Surgery

## 2012-05-27 DIAGNOSIS — E785 Hyperlipidemia, unspecified: Secondary | ICD-10-CM | POA: Insufficient documentation

## 2012-05-27 DIAGNOSIS — E210 Primary hyperparathyroidism: Secondary | ICD-10-CM

## 2012-05-27 DIAGNOSIS — M810 Age-related osteoporosis without current pathological fracture: Secondary | ICD-10-CM | POA: Insufficient documentation

## 2012-05-27 DIAGNOSIS — Z7982 Long term (current) use of aspirin: Secondary | ICD-10-CM | POA: Insufficient documentation

## 2012-05-27 DIAGNOSIS — E119 Type 2 diabetes mellitus without complications: Secondary | ICD-10-CM | POA: Insufficient documentation

## 2012-05-27 DIAGNOSIS — K219 Gastro-esophageal reflux disease without esophagitis: Secondary | ICD-10-CM | POA: Insufficient documentation

## 2012-05-27 DIAGNOSIS — Z79899 Other long term (current) drug therapy: Secondary | ICD-10-CM | POA: Insufficient documentation

## 2012-05-27 DIAGNOSIS — D351 Benign neoplasm of parathyroid gland: Secondary | ICD-10-CM

## 2012-05-27 DIAGNOSIS — I10 Essential (primary) hypertension: Secondary | ICD-10-CM | POA: Insufficient documentation

## 2012-05-27 HISTORY — PX: PARATHYROIDECTOMY: SHX19

## 2012-05-27 SURGERY — PARATHYROIDECTOMY
Anesthesia: General | Site: Neck | Wound class: Clean

## 2012-05-27 MED ORDER — ONDANSETRON HCL 4 MG/2ML IJ SOLN
INTRAMUSCULAR | Status: DC | PRN
  Administered 2012-05-27: 4 mg via INTRAVENOUS

## 2012-05-27 MED ORDER — DEXAMETHASONE SODIUM PHOSPHATE 10 MG/ML IJ SOLN
INTRAMUSCULAR | Status: DC | PRN
  Administered 2012-05-27: 10 mg via INTRAVENOUS

## 2012-05-27 MED ORDER — HYDROMORPHONE HCL PF 1 MG/ML IJ SOLN
0.2500 mg | INTRAMUSCULAR | Status: DC | PRN
Start: 2012-05-27 — End: 2012-05-27

## 2012-05-27 MED ORDER — GLYCOPYRROLATE 0.2 MG/ML IJ SOLN
INTRAMUSCULAR | Status: DC | PRN
  Administered 2012-05-27: .8 mg via INTRAVENOUS

## 2012-05-27 MED ORDER — BUPIVACAINE HCL (PF) 0.25 % IJ SOLN
INTRAMUSCULAR | Status: AC
  Filled 2012-05-27: qty 30

## 2012-05-27 MED ORDER — HYDROCODONE-ACETAMINOPHEN 5-325 MG PO TABS: 1.0000 | ORAL_TABLET | ORAL | Status: DC | PRN

## 2012-05-27 MED ORDER — MIDAZOLAM HCL 5 MG/5ML IJ SOLN
INTRAMUSCULAR | Status: DC | PRN
  Administered 2012-05-27: 2 mg via INTRAVENOUS

## 2012-05-27 MED ORDER — CEFAZOLIN SODIUM-DEXTROSE 2-3 GM-% IV SOLR
INTRAVENOUS | Status: AC
  Filled 2012-05-27: qty 50

## 2012-05-27 MED ORDER — IBUPROFEN 400 MG PO TABS
400.0000 mg | ORAL_TABLET | Freq: Four times a day (QID) | ORAL | Status: DC | PRN
  Administered 2012-05-27: 400 mg via ORAL
  Filled 2012-05-27: qty 1

## 2012-05-27 MED ORDER — SUCCINYLCHOLINE CHLORIDE 20 MG/ML IJ SOLN
INTRAMUSCULAR | Status: DC | PRN
  Administered 2012-05-27: 100 mg via INTRAVENOUS

## 2012-05-27 MED ORDER — PROPOFOL 10 MG/ML IV BOLUS
INTRAVENOUS | Status: DC | PRN
  Administered 2012-05-27: 150 mg via INTRAVENOUS

## 2012-05-27 MED ORDER — LACTATED RINGERS IV SOLN
INTRAVENOUS | Status: DC
  Administered 2012-05-27 (×2): 1000 mL via INTRAVENOUS

## 2012-05-27 MED ORDER — LIDOCAINE HCL 4 % MT SOLN
OROMUCOSAL | Status: DC | PRN
  Administered 2012-05-27: 4 mL via TOPICAL

## 2012-05-27 MED ORDER — CEFAZOLIN SODIUM-DEXTROSE 2-3 GM-% IV SOLR
2.0000 g | INTRAVENOUS | Status: AC
  Administered 2012-05-27: 2 g via INTRAVENOUS

## 2012-05-27 MED ORDER — FENTANYL CITRATE 0.05 MG/ML IJ SOLN
INTRAMUSCULAR | Status: DC | PRN
  Administered 2012-05-27 (×2): 50 ug via INTRAVENOUS

## 2012-05-27 MED ORDER — PROMETHAZINE HCL 25 MG/ML IJ SOLN: 6.2500 mg | INTRAMUSCULAR | Status: DC | PRN

## 2012-05-27 MED ORDER — ROCURONIUM BROMIDE 100 MG/10ML IV SOLN
INTRAVENOUS | Status: DC | PRN
  Administered 2012-05-27: 30 mg via INTRAVENOUS

## 2012-05-27 MED ORDER — NEOSTIGMINE METHYLSULFATE 1 MG/ML IJ SOLN
INTRAMUSCULAR | Status: DC | PRN
  Administered 2012-05-27: 5 mg via INTRAVENOUS

## 2012-05-27 SURGICAL SUPPLY — 40 items
APL SKNCLS STERI-STRIP NONHPOA (GAUZE/BANDAGES/DRESSINGS) ×1
ATTRACTOMAT 16X20 MAGNETIC DRP (DRAPES) ×2 IMPLANT
BENZOIN TINCTURE PRP APPL 2/3 (GAUZE/BANDAGES/DRESSINGS) ×2 IMPLANT
BLADE HEX COATED 2.75 (ELECTRODE) ×2 IMPLANT
BLADE SURG 15 STRL LF DISP TIS (BLADE) ×1 IMPLANT
BLADE SURG 15 STRL SS (BLADE) ×2
CANISTER SUCTION 2500CC (MISCELLANEOUS) ×2 IMPLANT
CHLORAPREP W/TINT 10.5 ML (MISCELLANEOUS) ×2 IMPLANT
CLIP TI MEDIUM 6 (CLIP) ×4 IMPLANT
CLIP TI WIDE RED SMALL 6 (CLIP) ×4 IMPLANT
CLOTH BEACON ORANGE TIMEOUT ST (SAFETY) ×2 IMPLANT
DISSECTOR ROUND CHERRY 3/8 STR (MISCELLANEOUS) IMPLANT
DRAPE PED LAPAROTOMY (DRAPES) ×2 IMPLANT
DRESSING SURGICEL FIBRLLR 1X2 (HEMOSTASIS) ×1 IMPLANT
DRSG SURGICEL FIBRILLAR 1X2 (HEMOSTASIS) ×2
ELECT REM PT RETURN 9FT ADLT (ELECTROSURGICAL) ×2
ELECTRODE REM PT RTRN 9FT ADLT (ELECTROSURGICAL) ×1 IMPLANT
GAUZE SPONGE 4X4 16PLY XRAY LF (GAUZE/BANDAGES/DRESSINGS) ×2 IMPLANT
GLOVE SURG ORTHO 8.0 STRL STRW (GLOVE) ×10 IMPLANT
GOWN STRL NON-REIN LRG LVL3 (GOWN DISPOSABLE) ×2 IMPLANT
GOWN STRL REIN XL XLG (GOWN DISPOSABLE) ×4 IMPLANT
KIT BASIN OR (CUSTOM PROCEDURE TRAY) ×2 IMPLANT
NDL HYPO 25X1 1.5 SAFETY (NEEDLE) ×1 IMPLANT
NEEDLE HYPO 25X1 1.5 SAFETY (NEEDLE) ×2 IMPLANT
NS IRRIG 1000ML POUR BTL (IV SOLUTION) ×2 IMPLANT
PACK BASIC VI WITH GOWN DISP (CUSTOM PROCEDURE TRAY) ×2 IMPLANT
PENCIL BUTTON HOLSTER BLD 10FT (ELECTRODE) ×2 IMPLANT
SPONGE GAUZE 4X4 12PLY (GAUZE/BANDAGES/DRESSINGS) IMPLANT
STAPLER VISISTAT 35W (STAPLE) ×1 IMPLANT
STRIP CLOSURE SKIN 1/2X4 (GAUZE/BANDAGES/DRESSINGS) ×2 IMPLANT
SUT MNCRL AB 4-0 PS2 18 (SUTURE) ×2 IMPLANT
SUT SILK 2 0 (SUTURE)
SUT SILK 2-0 18XBRD TIE 12 (SUTURE) ×1 IMPLANT
SUT SILK 3 0 (SUTURE)
SUT SILK 3-0 18XBRD TIE 12 (SUTURE) IMPLANT
SUT VIC AB 3-0 SH 18 (SUTURE) ×2 IMPLANT
SYR BULB IRRIGATION 50ML (SYRINGE) ×2 IMPLANT
SYR CONTROL 10ML LL (SYRINGE) ×2 IMPLANT
TOWEL OR 17X26 10 PK STRL BLUE (TOWEL DISPOSABLE) ×2 IMPLANT
YANKAUER SUCT BULB TIP 10FT TU (MISCELLANEOUS) ×2 IMPLANT

## 2012-05-27 NOTE — H&P (View-Only) (Signed)
General Surgery [facility]  Chief Complaint  Patient presents with  . New Evaluation    eval parathyroid adenoma - referral from Dr. Carrolyn Meiers    HISTORY: Patient is a 61 year old white female referred by her endocrinologist with hypercalcemia and primary hyperparathyroidism. Patient has had problems dating back to 2011 following low back surgery. She developed memory loss and significant hypertension. She has had episodes of hypercalcemia with significant confusion requiring hospitalization. Workup has shown calcium levels to exceed 12.0. Recent levels were elevated at 10.7. Intact PTH level was elevated at 102.  Patient underwent nuclear medicine parathyroid scan in September 2013. This localizes a parathyroid adenoma to the left inferior position. Patient is referred at this time for consideration for parathyroidectomy.  Patient has had no prior head or neck surgery with the exception of tonsillectomy. She denies any other significant endocrinopathy except for diabetes. There is no family history of parathyroid disease or other endocrinopathy.  Past Medical History  Diagnosis Date  . Hyperlipidemia   . Hypertension   . GERD (gastroesophageal reflux disease)   . Anemia   . Hypercalcemia   . Diabetes mellitus without complication   . Depression   . Osteopenia   . Arthritis   . Heart murmur   . Osteoporosis      Current Outpatient Prescriptions  Medication Sig Dispense Refill  . aspirin 81 MG tablet Take 81 mg by mouth daily.      Marland Kitchen atorvastatin (LIPITOR) 80 MG tablet Take 80 mg by mouth daily.      . Choline Fenofibrate (TRILIPIX) 135 MG capsule Take 135 mg by mouth daily.      Marland Kitchen dexlansoprazole (DEXILANT) 60 MG capsule Take 60 mg by mouth daily.      . DULoxetine (CYMBALTA) 60 MG capsule Take 60 mg by mouth daily.      Marland Kitchen estradiol (VAGIFEM) 25 MCG vaginal tablet Place 25 mcg vaginally daily.      . hydrALAZINE (APRESOLINE) 50 MG tablet Take 50 mg by  mouth 3 (three) times daily.      Marland Kitchen linagliptin (TRADJENTA) 5 MG TABS tablet Take 5 mg by mouth daily.      . metoCLOPramide (REGLAN) 10 MG tablet Take 10 mg by mouth at bedtime.      . Multiple Vitamins-Minerals (MULTIVITAMIN WITH MINERALS) tablet Take 1 tablet by mouth daily.      . risedronate (ACTONEL) 150 MG tablet Take 150 mg by mouth every 30 (thirty) days. with water on empty stomach, nothing by mouth or lie down for next 30 minutes.         Allergies  Allergen Reactions  . Codeine     hallucinations     Family History  Problem Relation Age of Onset  . Diabetes Mother      History   Social History  . Marital Status: Married    Spouse Name: N/A    Number of Children: N/A  . Years of Education: N/A   Social History Main Topics  . Smoking status: Former Smoker    Quit date: 05/07/1978  . Smokeless tobacco: None  . Alcohol Use: Yes  . Drug Use: No  . Sexually Active: None   Other Topics Concern  . None   Social History Narrative  . None     REVIEW OF SYSTEMS - PERTINENT POSITIVES ONLY: Patient notes chronic fatigue. She has problems with memory. She frequently has significant confusion. She has speech difficulties. She has been documented  to have osteopenia by bone density scanning. She denies nephrolithiasis.  EXAM: Filed Vitals:   05/12/12 1403  BP: 122/66  Pulse: 108  Temp: 97.6 F (36.4 C)  Resp: 16    HEENT: normocephalic; pupils equal and reactive; sclerae clear; dentition good; mucous membranes moist NECK:  symmetric on extension; no palpable anterior or posterior cervical lymphadenopathy; no supraclavicular masses; no tenderness CHEST: clear to auscultation bilaterally without rales, rhonchi, or wheezes CARDIAC: regular rate and rhythm without significant murmur; peripheral pulses are full EXT:  non-tender without edema; no deformity NEURO: no gross focal deficits; no sign of tremor   LABORATORY RESULTS: See [facility] (CHL-Epic) for  most recent results   RADIOLOGY RESULTS: See [facility] (CHL-Epic) for most recent results   IMPRESSION: #1 primary hyperparathyroidism, 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 I have provided her with written literature on parathyroid disease to review. The nuclear medicine parathyroid scan localizes in adenoma to the left inferior position. I have recommended minimally invasive parathyroidectomy as an outpatient surgical procedure. We discussed the location of the surgical incision, the hospitalization, and the postoperative recovery to be anticipated. We discussed potential complications including the potential for a second gland adenoma and the possible need for additional surgery. The patient understands and wishes to proceed. We will make arrangements for surgery at a time convenient for her in the near future.  The risks and benefits of the procedure have been discussed at length with the patient.  The patient understands the proposed procedure, potential alternative treatments, and the course of recovery to be expected.  All of the patient's questions have been answered at this time.  The patient wishes to proceed with surgery.  Earnstine Regal, MD, [facility]   Visit Diagnoses: 1. Hyperparathyroidism, primary     Primary Care Physician: Sheela Stack, MD

## 2012-05-27 NOTE — Preoperative (Signed)
Beta Blockers   Reason not to administer Beta Blockers:Not Applicable 

## 2012-05-27 NOTE — Op Note (Signed)
OPERATIVE REPORT - PARATHYROIDECTOMY  Preoperative diagnosis: Primary hyperparathyroidism  Postop diagnosis: Same  Procedure: Left minimally invasive parathyroidectomy  Surgeon:  Earnstine Regal, MD, FACS  Anesthesia: General endotracheal  Estimated blood loss: Minimal  Preparation: ChloraPrep  Indications: Patient is a 61 year old white female referred by her endocrinologist with hypercalcemia and primary hyperparathyroidism. Patient has had problems dating back to 2011 following low back surgery. She developed memory loss and significant hypertension. She has had episodes of hypercalcemia with significant confusion requiring hospitalization. Workup has shown calcium levels to exceed 12.0. Recent levels were elevated at 10.7. Intact PTH level was elevated at 102.  Patient underwent nuclear medicine parathyroid scan in September 2013. This localizes a parathyroid adenoma to the left inferior position. Patient is referred at this time for consideration for parathyroidectomy.  Procedure: Patient was prepared in the holding area. He was brought to operating room and placed in a supine position on the operating room table. Following administration of general anesthesia, the patient was positioned and then prepped and draped in the usual strict aseptic fashion. After ascertaining that an adequate level of anesthesia been achieved, a neck incision is made with a #15 blade. Dissection was carried through subcutaneous tissues and platysma. Hemostasis was obtained with the electrocautery. Skin flaps were developed circumferentially and a Weitlander retractor was placed for exposure.  Strap muscles were incised in the midline. Strap muscles were reflected exposing the thyroid lobe. With gentle blunt dissection the thyroid lobe was mobilized.  Dissection was carried through adipose tissue and an enlarged parathyroid gland is identified. It is gently mobilized. Vascular structures are divided between small  and medium ligaclips. Care is taken to avoid the recurrent laryngeal nerve and the esophagus. Gland is completely excised. It is submitted to pathology. Frozen section confirms parathyroid tissue consistent with adenoma.  Neck is irrigated with warm saline. Good hemostasis is noted. Surgicel is placed in the operative field. Strap muscles are reapproximated in the midline with interrupted 3-0 Vicryl sutures. Platysma was closed with interrupted 3-0 Vicryl sutures. Skin is closed with a running 4-0 Monocryl subcuticular suture. Local anesthetic is infiltrated circumferentially. Wound was washed and dried and benzoin and Steri-Strips are applied. Sterile gauze dressing is applied. Patient is awakened from anesthesia and brought to the recovery room. The patient tolerated the procedure well.   Earnstine Regal, MD, [facility]

## 2012-05-27 NOTE — Anesthesia Postprocedure Evaluation (Signed)
  Anesthesia Post-op Note  Patient: Casey Jennings  Procedure(s) Performed: Procedure(s) (LRB): PARATHYROIDECTOMY (N/A)  Patient Location: PACU  Anesthesia Type: General  Level of Consciousness: awake and alert   Airway and Oxygen Therapy: Patient Spontanous Breathing  Post-op Pain: mild  Post-op Assessment: Post-op Vital signs reviewed, Patient's Cardiovascular Status Stable, Respiratory Function Stable, Patent Airway and No signs of Nausea or vomiting  Post-op Vital Signs: stable  Complications: No apparent anesthesia complications

## 2012-05-27 NOTE — Transfer of Care (Signed)
Immediate Anesthesia Transfer of Care Note  Patient: Casey Jennings  Procedure(s) Performed: Procedure(s) (LRB): PARATHYROIDECTOMY (N/A)  Patient Location: PACU  Anesthesia Type: General  Level of Consciousness: sedated, patient cooperative and responds to stimulaton  Airway & Oxygen Therapy: Patient Spontanous Breathing and Patient connected to face mask oxgen  Post-op Assessment: Report given to PACU RN and Post -op Vital signs reviewed and stable  Post vital signs: Reviewed and stable  Complications: No apparent anesthesia complications

## 2012-05-27 NOTE — Telephone Encounter (Signed)
LMOM for pt that lab slip has been mailed to pt for calcium on 06-09-12.

## 2012-05-27 NOTE — Interval H&P Note (Signed)
History and Physical Interval Note:  05/27/2012 9:41 AM  Casey Jennings  has presented today for surgery, with the diagnosis of primary hyperparathyroidism.  The various methods of treatment have been discussed with the patient and family. After consideration of risks, benefits and other options for treatment, the patient has consented to    Procedure(s) (LRB) with comments: PARATHYROIDECTOMY (N/A) as a surgical intervention .    The patient's history has been reviewed, patient examined, no change in status, stable for surgery.  I have reviewed the patient's chart and labs.  Questions were answered to the patient's satisfaction.    Earnstine Regal, MD, [facility] Office: [place]

## 2012-05-27 NOTE — Anesthesia Preprocedure Evaluation (Addendum)
Anesthesia Evaluation  Patient identified by MRN, date of birth, ID band Patient awake    Reviewed: Allergy & Precautions, H&P , NPO status , Patient's Chart, lab work & pertinent test results  Airway Mallampati: II TM Distance: >3 FB Neck ROM: Full    Dental No notable dental hx.    Pulmonary neg pulmonary ROS,  breath sounds clear to auscultation  Pulmonary exam normal       Cardiovascular hypertension, Pt. on medications + Valvular Problems/Murmurs Rhythm:Regular Rate:Normal + Systolic murmurs    Neuro/Psych PSYCHIATRIC DISORDERS Depression H/O vertigo for a month back in 2012 which is now believed to be secondary to hyperparathyroidism.    GI/Hepatic Neg liver ROS, GERD-  Medicated,  Endo/Other  negative endocrine ROSdiabetes, Type 2, Oral Hypoglycemic Agents  Renal/GU Renal diseaseCr 1.0 K 3.5  negative genitourinary   Musculoskeletal negative musculoskeletal ROS (+)   Abdominal   Peds negative pediatric ROS (+)  Hematology negative hematology ROS (+)   Anesthesia Other Findings   Reproductive/Obstetrics negative OB ROS                         Anesthesia Physical Anesthesia Plan  ASA: III  Anesthesia Plan: General   Post-op Pain Management:    Induction: Intravenous  Airway Management Planned: Oral ETT  Additional Equipment:   Intra-op Plan:   Post-operative Plan: Extubation in OR  Informed Consent: I have reviewed the patients History and Physical, chart, labs and discussed the procedure including the risks, benefits and alternatives for the proposed anesthesia with the patient or authorized representative who has indicated his/her understanding and acceptance.   Dental advisory given  Plan Discussed with: CRNA  Anesthesia Plan Comments:        Anesthesia Quick Evaluation

## 2012-05-28 ENCOUNTER — Encounter (HOSPITAL_COMMUNITY): Payer: Self-pay | Admitting: Surgery

## 2012-05-29 NOTE — Progress Notes (Signed)
Quick Note:  Please contact patient and notify of benign pathology results.  Ieshia Hatcher M. Armetta Henri, MD, FACS [facility] Office: [phone]   ______ 

## 2012-06-01 ENCOUNTER — Telehealth (INDEPENDENT_AMBULATORY_CARE_PROVIDER_SITE_OTHER): Payer: Self-pay | Admitting: General Surgery

## 2012-06-01 NOTE — Telephone Encounter (Signed)
She had a parathyroidectomy on November 5. Yesterday afternoon she began having some numbness and tingling in her arms fingertips face and lips. It has persisted. I advised her to take 4 TUMS now and then repeat in 2 hours and drink a large glass of milk. If the symptoms do not resolve I advised her to go to the emergency room and have a calcium level checked and have them call me with that.

## 2012-06-02 ENCOUNTER — Telehealth (INDEPENDENT_AMBULATORY_CARE_PROVIDER_SITE_OTHER): Payer: Self-pay

## 2012-06-02 ENCOUNTER — Other Ambulatory Visit (INDEPENDENT_AMBULATORY_CARE_PROVIDER_SITE_OTHER): Payer: Self-pay

## 2012-06-02 NOTE — Telephone Encounter (Signed)
LMOM at home and cell. Pt needs to have repeat calcium as a stat today. If pt does not respond in time will need to try to do labs tomorrow am. I will have order in system.

## 2012-06-02 NOTE — Telephone Encounter (Signed)
Pt notified stat calcium today is 9.2 and of path result.

## 2012-06-02 NOTE — Telephone Encounter (Signed)
Patient returning call-- Casey Jennings made aware that she need's to go to [facility] for her Calcium Levels to be checked (STAT) patient will go today.

## 2012-06-02 NOTE — Telephone Encounter (Signed)
Called patient to make her aware to go to [facility] instead of [facility] for Calcium Blood Serum levels.

## 2012-06-09 ENCOUNTER — Telehealth (INDEPENDENT_AMBULATORY_CARE_PROVIDER_SITE_OTHER): Payer: Self-pay | Admitting: General Surgery

## 2012-06-09 NOTE — Telephone Encounter (Signed)
Pt called to ask advice regarding having no taste sensation after surgery/ She is scheduled to see Dr. Harlow Asa his week/ I reviewed this with Dr. Zella Richer and he said it could be due to anesthesia and should be temporary/ Pt will review this with Dr. Marcella Dubs

## 2012-06-12 ENCOUNTER — Ambulatory Visit (INDEPENDENT_AMBULATORY_CARE_PROVIDER_SITE_OTHER): Payer: BC Managed Care – PPO | Admitting: Surgery

## 2012-06-12 ENCOUNTER — Encounter (INDEPENDENT_AMBULATORY_CARE_PROVIDER_SITE_OTHER): Payer: Self-pay | Admitting: Surgery

## 2012-06-12 VITALS — BP 118/74 | HR 92 | Temp 98.6°F | Resp 16 | Ht 64.5 in | Wt 155.8 lb

## 2012-06-12 DIAGNOSIS — E210 Primary hyperparathyroidism: Secondary | ICD-10-CM

## 2012-06-12 NOTE — Progress Notes (Signed)
General Surgery [facility]  Visit Diagnoses: 1. Hyperparathyroidism, primary     HISTORY: Patient is a 61 year old white female who underwent minimally invasive parathyroidectomy on 05/27/2012. 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 Followup serum calcium level is normal at 9.2.  EXAM: Surgical wound is healing nicely. Mild soft tissue swelling. No sign of seroma. No sign of infection. Voice quality is normal.  IMPRESSION: Status post minimally invasive parathyroidectomy with normalization of serum calcium level  PLAN: Patient will begin to apply topical creams to her incision. She will begin taking her regular vitamin D and calcium daily. She will return to see me in 6 weeks for final wound check. We will check a repeat calcium and an intact PTH level prior to that office visit.  Earnstine Regal, MD, [facility]

## 2012-06-12 NOTE — Patient Instructions (Signed)
  COCOA BUTTER & VITAMIN E CREAM  ([employer] or other brand)  Apply cocoa butter/vitamin E cream to your incision 2 - 3 times daily.  Massage cream into incision for one minute with each application.  Use sunscreen (50 SPF or higher) for first 6 months after surgery if area is exposed to sun.  You may substitute Mederma or other scar reducing creams as desired.   

## 2012-07-23 LAB — PTH, INTACT AND CALCIUM
Calcium: 8.6 mg/dL (ref 8.6–10.2)
PTH: 28 pg/mL (ref 15–65)

## 2012-07-28 ENCOUNTER — Encounter (INDEPENDENT_AMBULATORY_CARE_PROVIDER_SITE_OTHER): Payer: Self-pay | Admitting: Surgery

## 2012-07-28 ENCOUNTER — Ambulatory Visit (INDEPENDENT_AMBULATORY_CARE_PROVIDER_SITE_OTHER): Payer: BC Managed Care – PPO | Admitting: Surgery

## 2012-07-28 VITALS — BP 130/68 | HR 84 | Temp 96.5°F | Resp 16 | Ht 64.5 in | Wt 151.6 lb

## 2012-07-28 DIAGNOSIS — E210 Primary hyperparathyroidism: Secondary | ICD-10-CM

## 2012-07-28 NOTE — Progress Notes (Signed)
General Surgery [facility]  Visit Diagnoses: 1. Hyperparathyroidism, primary     HISTORY: Patient returns for postoperative visit having undergone minimally invasive parathyroidectomy in early November 2013. Follow-up laboratory studies from 07/22/2012 showed a calcium level to be normal at 8.6 and the intact PTH level to be normal at 28.  Overall, the patient feels markedly improved including more energy, less sleepy, and resolved chronic constipation.  EXAM: Surgical wound is nicely healed. Good cosmetic result. No sign of seroma. No sign of infection. Voice quality is normal.  IMPRESSION: Status post minimally invasive parathyroidectomy  PLAN: Patient will continue to apply topical creams to her incision. She is scheduled to follow-up with her endocrinologist in the near future. She will return to see me as needed.  Earnstine Regal, MD, [facility]

## 2012-07-28 NOTE — Patient Instructions (Signed)
  COCOA BUTTER & VITAMIN E CREAM  ([employer] or other brand)  Apply cocoa butter/vitamin E cream to your incision 2 - 3 times daily.  Massage cream into incision for one minute with each application.  Use sunscreen (50 SPF or higher) for first 6 months after surgery if area is exposed to sun.  You may substitute Mederma or other scar reducing creams as desired.   

## 2012-10-30 ENCOUNTER — Encounter (INDEPENDENT_AMBULATORY_CARE_PROVIDER_SITE_OTHER): Payer: Self-pay

## 2012-11-05 ENCOUNTER — Other Ambulatory Visit: Payer: Self-pay

## 2012-11-05 DIAGNOSIS — Z1231 Encounter for screening mammogram for malignant neoplasm of breast: Secondary | ICD-10-CM

## 2012-11-19 ENCOUNTER — Ambulatory Visit
Admission: RE | Admit: 2012-11-19 | Discharge: 2012-11-19 | Disposition: A | Discharge status: Home / Self Care | Payer: BC Managed Care – PPO | Source: Ambulatory Visit

## 2012-11-19 DIAGNOSIS — Z1231 Encounter for screening mammogram for malignant neoplasm of breast: Secondary | ICD-10-CM

## 2012-11-25 ENCOUNTER — Encounter (INDEPENDENT_AMBULATORY_CARE_PROVIDER_SITE_OTHER): Payer: Self-pay

## 2013-05-15 ENCOUNTER — Ambulatory Visit (INDEPENDENT_AMBULATORY_CARE_PROVIDER_SITE_OTHER): Payer: BC Managed Care – PPO | Admitting: Podiatrist

## 2013-05-15 ENCOUNTER — Encounter: Payer: Self-pay | Admitting: Podiatrist

## 2013-05-15 VITALS — BP 113/68 | HR 86 | Resp 16 | Ht 64.0 in | Wt 171.0 lb

## 2013-05-15 DIAGNOSIS — L600 Ingrowing nail: Secondary | ICD-10-CM

## 2013-05-15 NOTE — Patient Instructions (Signed)

## 2013-05-15 NOTE — Progress Notes (Signed)
Subjective: Patient presented today for painful ingrown toenail medial aspect of the right first toenail patient relates pain with pressure and in tight shoes she denies any redness swelling pus or purulence. Objective: GENERAL APPEARANCE: Alert, conversant. Appropriately groomed. No acute distress.  VASCULAR: Pedal pulses palpable and strong bilateral.  Capillary refill time is immediate to all digits,  Proximal to distal cooling it warm to warm.  Digital hair growth is present bilateral  NEUROLOGIC: sensation is intact epicritically and protectively to 5.07 monofilament at 5/5 sites bilateral.  Light touch is intact bilateral, vibratory sensation intact bilateral, achilles tendon reflex is intact bilateral.  MUSCULOSKELETAL: acceptable muscle strength, tone and stability bilateral.  Intrinsic muscluature intact bilateral.  Rectus appearance of foot and digits noted bilateral.   DERMATOLOGIC: skin color, texture, and turger are within normal limits.  No preulcerative lesions are seen, no interdigital maceration noted.  No open lesions present.  Digital nail on the right first and symptomatic and painful on the medial border and medial corner. No redness or swelling is noted however pain with pressure is seen  Assessment: Ingrown toenail right first medial corner  Plan:  Treatment options and alternatives discussed.  Recommended permanent phenol matrixectomy and patient agreed.  right was prepped with alcohol and a 1 to 1 mix of 0.5% marcaine plain and 2% lidocaine plain was administered in a digital block fashion.  The toe was then prepped with betadine solution and exsanguinated.  The offending nail border was then excised and matrix tissue exposed.  Phenol was then applied to the matrix tissue followed by an alcohol wash.  Antibiotic ointment and a dry sterile dressing was applied.  The patient was dispensed instructions for aftercare.

## 2013-07-02 ENCOUNTER — Other Ambulatory Visit: Payer: Self-pay | Admitting: Endocrinology

## 2013-07-02 DIAGNOSIS — R2681 Unsteadiness on feet: Secondary | ICD-10-CM

## 2013-07-07 ENCOUNTER — Ambulatory Visit
Admission: RE | Admit: 2013-07-07 | Discharge: 2013-07-07 | Disposition: A | Discharge status: Home / Self Care | Payer: BC Managed Care – PPO | Source: Ambulatory Visit | Attending: Endocrinology | Admitting: Endocrinology

## 2013-07-07 DIAGNOSIS — R2681 Unsteadiness on feet: Secondary | ICD-10-CM

## 2013-07-07 MED ORDER — GADOBENATE DIMEGLUMINE 529 MG/ML IV SOLN
8.0000 mL | Freq: Once | INTRAVENOUS | Status: AC | PRN
  Administered 2013-07-07: 8 mL via INTRAVENOUS

## 2013-11-23 ENCOUNTER — Other Ambulatory Visit: Payer: Self-pay

## 2013-11-23 DIAGNOSIS — Z1231 Encounter for screening mammogram for malignant neoplasm of breast: Secondary | ICD-10-CM

## 2013-12-07 ENCOUNTER — Ambulatory Visit: Payer: BC Managed Care – PPO

## 2013-12-16 ENCOUNTER — Encounter (INDEPENDENT_AMBULATORY_CARE_PROVIDER_SITE_OTHER): Payer: Self-pay

## 2013-12-16 ENCOUNTER — Ambulatory Visit
Admission: RE | Admit: 2013-12-16 | Discharge: 2013-12-16 | Disposition: A | Discharge status: Home / Self Care | Payer: BC Managed Care – PPO | Source: Ambulatory Visit

## 2013-12-16 DIAGNOSIS — Z1231 Encounter for screening mammogram for malignant neoplasm of breast: Secondary | ICD-10-CM

## 2014-04-07 ENCOUNTER — Emergency Department (HOSPITAL_COMMUNITY)
Admission: EM | Admit: 2014-04-07 | Discharge: 2014-04-07 | Discharge status: Against Medical Advice | Payer: BC Managed Care – PPO | Attending: Emergency Medicine | Admitting: Emergency Medicine

## 2014-04-07 ENCOUNTER — Encounter (HOSPITAL_COMMUNITY): Payer: Self-pay | Admitting: Emergency Medicine

## 2014-04-07 DIAGNOSIS — E119 Type 2 diabetes mellitus without complications: Secondary | ICD-10-CM | POA: Diagnosis not present

## 2014-04-07 DIAGNOSIS — R011 Cardiac murmur, unspecified: Secondary | ICD-10-CM | POA: Diagnosis not present

## 2014-04-07 DIAGNOSIS — N189 Chronic kidney disease, unspecified: Secondary | ICD-10-CM | POA: Diagnosis not present

## 2014-04-07 DIAGNOSIS — R109 Unspecified abdominal pain: Secondary | ICD-10-CM | POA: Insufficient documentation

## 2014-04-07 DIAGNOSIS — I129 Hypertensive chronic kidney disease with stage 1 through stage 4 chronic kidney disease, or unspecified chronic kidney disease: Secondary | ICD-10-CM | POA: Insufficient documentation

## 2014-04-07 LAB — CBC WITH DIFFERENTIAL/PLATELET
BASOS ABS: 0 10*3/uL (ref 0.0–0.1)
BASOS PCT: 0 % (ref 0–1)
EOS ABS: 0.1 10*3/uL (ref 0.0–0.7)
EOS PCT: 2 % (ref 0–5)
HEMATOCRIT: 32.5 % — AB (ref 36.0–46.0)
HEMOGLOBIN: 11 g/dL — AB (ref 12.0–15.0)
Lymphocytes Relative: 21 % (ref 12–46)
Lymphs Abs: 1.6 10*3/uL (ref 0.7–4.0)
MCH: 29.7 pg (ref 26.0–34.0)
MCHC: 33.8 g/dL (ref 30.0–36.0)
MCV: 87.8 fL (ref 78.0–100.0)
MONO ABS: 0.7 10*3/uL (ref 0.1–1.0)
MONOS PCT: 9 % (ref 3–12)
Neutro Abs: 5.4 10*3/uL (ref 1.7–7.7)
Neutrophils Relative %: 68 % (ref 43–77)
Platelets: 370 10*3/uL (ref 150–400)
RBC: 3.7 MIL/uL — ABNORMAL LOW (ref 3.87–5.11)
RDW: 14.1 % (ref 11.5–15.5)
WBC: 7.9 10*3/uL (ref 4.0–10.5)

## 2014-04-07 LAB — COMPREHENSIVE METABOLIC PANEL
ALBUMIN: 3.6 g/dL (ref 3.5–5.2)
ALT: 20 U/L (ref 0–35)
ANION GAP: 14 (ref 5–15)
AST: 27 U/L (ref 0–37)
Alkaline Phosphatase: 40 U/L (ref 39–117)
BILIRUBIN TOTAL: 0.4 mg/dL (ref 0.3–1.2)
BUN: 19 mg/dL (ref 6–23)
CALCIUM: 9.8 mg/dL (ref 8.4–10.5)
CHLORIDE: 100 meq/L (ref 96–112)
CO2: 22 mEq/L (ref 19–32)
CREATININE: 1.47 mg/dL — AB (ref 0.50–1.10)
GFR calc Af Amer: 42 mL/min — ABNORMAL LOW (ref >90)
GFR, EST NON AFRICAN AMERICAN: 37 mL/min — AB (ref >90)
Glucose, Bld: 179 mg/dL — ABNORMAL HIGH (ref 70–99)
Potassium: 4.2 mEq/L (ref 3.7–5.3)
Sodium: 136 mEq/L — ABNORMAL LOW (ref 137–147)
Total Protein: 6.8 g/dL (ref 6.0–8.3)

## 2014-04-07 LAB — LIPASE, BLOOD: Lipase: 31 U/L (ref 11–59)

## 2014-04-07 NOTE — ED Notes (Signed)
Pt. reports low/mid abdominal pain onset Monday this week , denies nausea or vomitting / no fever or diarrhea.

## 2014-04-07 NOTE — ED Notes (Signed)
Pt states she is leaving due to wait.  Explained triage process to pt and encouraged her to stay.  Pt refused.

## 2014-04-10 ENCOUNTER — Encounter (HOSPITAL_COMMUNITY): Payer: Self-pay | Admitting: Emergency Medicine

## 2014-04-10 ENCOUNTER — Inpatient Hospital Stay (HOSPITAL_COMMUNITY)
Admission: EM | Admit: 2014-04-10 | Discharge: 2014-04-13 | DRG: 418 | Disposition: A | Discharge status: Home / Self Care | Payer: BC Managed Care – PPO | Attending: Surgery | Admitting: Surgery

## 2014-04-10 ENCOUNTER — Emergency Department (HOSPITAL_COMMUNITY): Payer: BC Managed Care – PPO

## 2014-04-10 DIAGNOSIS — K812 Acute cholecystitis with chronic cholecystitis: Secondary | ICD-10-CM | POA: Diagnosis present

## 2014-04-10 DIAGNOSIS — R1013 Epigastric pain: Secondary | ICD-10-CM | POA: Diagnosis present

## 2014-04-10 DIAGNOSIS — M069 Rheumatoid arthritis, unspecified: Secondary | ICD-10-CM | POA: Diagnosis present

## 2014-04-10 DIAGNOSIS — N179 Acute kidney failure, unspecified: Secondary | ICD-10-CM | POA: Diagnosis present

## 2014-04-10 DIAGNOSIS — K810 Acute cholecystitis: Secondary | ICD-10-CM

## 2014-04-10 DIAGNOSIS — K219 Gastro-esophageal reflux disease without esophagitis: Secondary | ICD-10-CM | POA: Diagnosis present

## 2014-04-10 DIAGNOSIS — E1149 Type 2 diabetes mellitus with other diabetic neurological complication: Secondary | ICD-10-CM | POA: Diagnosis present

## 2014-04-10 DIAGNOSIS — K560 Paralytic ileus: Secondary | ICD-10-CM | POA: Diagnosis not present

## 2014-04-10 DIAGNOSIS — Z794 Long term (current) use of insulin: Secondary | ICD-10-CM

## 2014-04-10 DIAGNOSIS — K819 Cholecystitis, unspecified: Secondary | ICD-10-CM

## 2014-04-10 DIAGNOSIS — Z7982 Long term (current) use of aspirin: Secondary | ICD-10-CM | POA: Diagnosis not present

## 2014-04-10 DIAGNOSIS — K3184 Gastroparesis: Secondary | ICD-10-CM | POA: Diagnosis present

## 2014-04-10 DIAGNOSIS — N189 Chronic kidney disease, unspecified: Secondary | ICD-10-CM | POA: Diagnosis present

## 2014-04-10 DIAGNOSIS — K8000 Calculus of gallbladder with acute cholecystitis without obstruction: Secondary | ICD-10-CM | POA: Diagnosis present

## 2014-04-10 DIAGNOSIS — I129 Hypertensive chronic kidney disease with stage 1 through stage 4 chronic kidney disease, or unspecified chronic kidney disease: Secondary | ICD-10-CM | POA: Diagnosis present

## 2014-04-10 DIAGNOSIS — E118 Type 2 diabetes mellitus with unspecified complications: Secondary | ICD-10-CM | POA: Diagnosis present

## 2014-04-10 DIAGNOSIS — Z79899 Other long term (current) drug therapy: Secondary | ICD-10-CM

## 2014-04-10 DIAGNOSIS — E1143 Type 2 diabetes mellitus with diabetic autonomic (poly)neuropathy: Secondary | ICD-10-CM | POA: Diagnosis present

## 2014-04-10 DIAGNOSIS — Z87891 Personal history of nicotine dependence: Secondary | ICD-10-CM | POA: Diagnosis not present

## 2014-04-10 LAB — CBC WITH DIFFERENTIAL/PLATELET
Basophils Absolute: 0 10*3/uL (ref 0.0–0.1)
Basophils Relative: 0 % (ref 0–1)
Eosinophils Absolute: 0.2 10*3/uL (ref 0.0–0.7)
Eosinophils Relative: 2 % (ref 0–5)
HCT: 29.7 % — ABNORMAL LOW (ref 36.0–46.0)
Hemoglobin: 10 g/dL — ABNORMAL LOW (ref 12.0–15.0)
LYMPHS ABS: 1.3 10*3/uL (ref 0.7–4.0)
LYMPHS PCT: 15 % (ref 12–46)
MCH: 29.2 pg (ref 26.0–34.0)
MCHC: 33.7 g/dL (ref 30.0–36.0)
MCV: 86.8 fL (ref 78.0–100.0)
Monocytes Absolute: 0.3 10*3/uL (ref 0.1–1.0)
Monocytes Relative: 4 % (ref 3–12)
NEUTROS PCT: 79 % — AB (ref 43–77)
Neutro Abs: 7 10*3/uL (ref 1.7–7.7)
PLATELETS: 375 10*3/uL (ref 150–400)
RBC: 3.42 MIL/uL — AB (ref 3.87–5.11)
RDW: 13.9 % (ref 11.5–15.5)
WBC: 8.8 10*3/uL (ref 4.0–10.5)

## 2014-04-10 LAB — BASIC METABOLIC PANEL
Anion gap: 14 (ref 5–15)
BUN: 26 mg/dL — ABNORMAL HIGH (ref 6–23)
CO2: 23 meq/L (ref 19–32)
Calcium: 9.4 mg/dL (ref 8.4–10.5)
Chloride: 101 mEq/L (ref 96–112)
Creatinine, Ser: 1.59 mg/dL — ABNORMAL HIGH (ref 0.50–1.10)
GFR calc Af Amer: 39 mL/min — ABNORMAL LOW (ref >90)
GFR calc non Af Amer: 33 mL/min — ABNORMAL LOW (ref >90)
GLUCOSE: 165 mg/dL — AB (ref 70–99)
Potassium: 3.7 mEq/L (ref 3.7–5.3)
SODIUM: 138 meq/L (ref 137–147)

## 2014-04-10 LAB — URINALYSIS, ROUTINE W REFLEX MICROSCOPIC
Bilirubin Urine: NEGATIVE
Glucose, UA: NEGATIVE mg/dL
Hgb urine dipstick: NEGATIVE
Ketones, ur: NEGATIVE mg/dL
Nitrite: NEGATIVE
PH: 5 (ref 5.0–8.0)
Protein, ur: NEGATIVE mg/dL
SPECIFIC GRAVITY, URINE: 1.023 (ref 1.005–1.030)
Urobilinogen, UA: 1 mg/dL (ref 0.0–1.0)

## 2014-04-10 LAB — URINE MICROSCOPIC-ADD ON

## 2014-04-10 LAB — HEPATIC FUNCTION PANEL
ALT: 21 U/L (ref 0–35)
AST: 35 U/L (ref 0–37)
Albumin: 3.7 g/dL (ref 3.5–5.2)
Alkaline Phosphatase: 50 U/L (ref 39–117)
Bilirubin, Direct: <0.2 mg/dL (ref 0.0–0.3)
TOTAL PROTEIN: 7.1 g/dL (ref 6.0–8.3)
Total Bilirubin: 0.5 mg/dL (ref 0.3–1.2)

## 2014-04-10 LAB — LIPASE, BLOOD: Lipase: 39 U/L (ref 11–59)

## 2014-04-10 LAB — GLUCOSE, CAPILLARY: Glucose-Capillary: 205 mg/dL — ABNORMAL HIGH (ref 70–99)

## 2014-04-10 MED ORDER — SODIUM CHLORIDE 0.9 % IV BOLUS (SEPSIS)
500.0000 mL | Freq: Once | INTRAVENOUS | Status: AC
  Administered 2014-04-10: 500 mL via INTRAVENOUS

## 2014-04-10 MED ORDER — HYDROMORPHONE HCL 1 MG/ML IJ SOLN
0.5000 mg | Freq: Once | INTRAMUSCULAR | Status: AC
  Administered 2014-04-10: 0.5 mg via INTRAVENOUS
  Filled 2014-04-10: qty 1

## 2014-04-10 MED ORDER — METRONIDAZOLE IN NACL 5-0.79 MG/ML-% IV SOLN
500.0000 mg | Freq: Three times a day (TID) | INTRAVENOUS | Status: DC
  Administered 2014-04-11 – 2014-04-12 (×4): 500 mg via INTRAVENOUS
  Filled 2014-04-10 (×4): qty 100

## 2014-04-10 MED ORDER — INSULIN ASPART 100 UNIT/ML ~~LOC~~ SOLN
0.0000 [IU] | Freq: Three times a day (TID) | SUBCUTANEOUS | Status: DC
  Administered 2014-04-11 (×2): 7 [IU] via SUBCUTANEOUS
  Administered 2014-04-12 (×2): 3 [IU] via SUBCUTANEOUS
  Administered 2014-04-12: 4 [IU] via SUBCUTANEOUS

## 2014-04-10 MED ORDER — MORPHINE SULFATE 2 MG/ML IJ SOLN
1.0000 mg | INTRAMUSCULAR | Status: DC | PRN
  Administered 2014-04-10: 1 mg via INTRAVENOUS
  Administered 2014-04-11 (×2): 4 mg via INTRAVENOUS
  Administered 2014-04-11 – 2014-04-12 (×3): 2 mg via INTRAVENOUS
  Administered 2014-04-12: 4 mg via INTRAVENOUS
  Filled 2014-04-10: qty 2
  Filled 2014-04-10 (×2): qty 1
  Filled 2014-04-10: qty 2
  Filled 2014-04-10: qty 1
  Filled 2014-04-10 (×2): qty 2

## 2014-04-10 MED ORDER — IOHEXOL 300 MG/ML  SOLN
25.0000 mL | Freq: Once | INTRAMUSCULAR | Status: AC | PRN
  Administered 2014-04-10: 25 mL via ORAL

## 2014-04-10 MED ORDER — CIPROFLOXACIN IN D5W 400 MG/200ML IV SOLN
400.0000 mg | Freq: Once | INTRAVENOUS | Status: AC
  Administered 2014-04-10: 400 mg via INTRAVENOUS
  Filled 2014-04-10: qty 200

## 2014-04-10 MED ORDER — ONDANSETRON HCL 4 MG/2ML IJ SOLN
4.0000 mg | Freq: Once | INTRAMUSCULAR | Status: AC
  Administered 2014-04-10: 4 mg via INTRAVENOUS
  Filled 2014-04-10: qty 2

## 2014-04-10 MED ORDER — POTASSIUM CHLORIDE IN NACL 20-0.45 MEQ/L-% IV SOLN
INTRAVENOUS | Status: DC
  Administered 2014-04-10: via INTRAVENOUS
  Filled 2014-04-10 (×3): qty 1000

## 2014-04-10 MED ORDER — METRONIDAZOLE IN NACL 5-0.79 MG/ML-% IV SOLN
500.0000 mg | Freq: Once | INTRAVENOUS | Status: AC
  Administered 2014-04-10: 500 mg via INTRAVENOUS
  Filled 2014-04-10: qty 100

## 2014-04-10 MED ORDER — IOHEXOL 300 MG/ML  SOLN
80.0000 mL | Freq: Once | INTRAMUSCULAR | Status: AC | PRN
  Administered 2014-04-10: 80 mL via INTRAVENOUS

## 2014-04-10 MED ORDER — CIPROFLOXACIN IN D5W 400 MG/200ML IV SOLN
400.0000 mg | Freq: Two times a day (BID) | INTRAVENOUS | Status: DC
  Administered 2014-04-11 (×2): 400 mg via INTRAVENOUS
  Filled 2014-04-10 (×3): qty 200

## 2014-04-10 MED ORDER — ONDANSETRON HCL 4 MG/2ML IJ SOLN
4.0000 mg | Freq: Four times a day (QID) | INTRAMUSCULAR | Status: DC | PRN
  Administered 2014-04-10 – 2014-04-12 (×4): 4 mg via INTRAVENOUS
  Filled 2014-04-10 (×4): qty 2

## 2014-04-10 NOTE — ED Provider Notes (Signed)
CSN: [id number]     Arrival date & time 04/10/14  1743 History   First MD Initiated Contact with Patient 04/10/14 1820     Chief Complaint  Patient presents with  . Abdominal Pain     (Consider location/radiation/quality/duration/timing/severity/associated sxs/prior Treatment) Patient is a 63 y.o. female presenting with abdominal pain. The history is provided by the patient.  Abdominal Pain Pain location:  Epigastric Pain quality: burning and sharp   Pain radiation: upper back. Pain severity:  Moderate Onset quality:  Sudden Duration:  5 days Timing:  Constant Progression:  Worsening Chronicity:  New Context comment:  After eating Relieved by:  Nothing Worsened by:  Nothing tried Ineffective treatments:  None tried Associated symptoms: diarrhea, nausea and vomiting   Associated symptoms: no chest pain, no cough, no dysuria, no fatigue, no fever, no hematuria and no shortness of breath     Past Medical History  Diagnosis Date  . Hyperlipidemia   . Hypertension   . GERD (gastroesophageal reflux disease)   . Anemia   . Hypercalcemia   . Diabetes mellitus without complication   . Depression   . Osteopenia   . Arthritis   . Heart murmur   . Osteoporosis   . Chronic kidney disease     hx of uti   . Yeast infection     questinable per pt on 05/23/12- to let MD be aware of    Past Surgical History  Procedure Laterality Date  . Carpal tunnel release    . Bladder suspension    . Spine surgery    . Cesarean section    . Right ankle surgery       due to fracture   . Tonsillectomy    . Back surgery    . Parathyroidectomy  05/27/2012    Procedure: PARATHYROIDECTOMY;  Surgeon: Earnstine Regal, MD;  Location: [facility];  Service: General;  Laterality: N/A;   Family History  Problem Relation Age of Onset  . Diabetes Mother    History  Substance Use Topics  . Smoking status: Former Smoker    Quit date: 05/07/1978  . Smokeless tobacco: Not on file  . Alcohol Use: No   OB  History   Grav Para Term Preterm Abortions TAB SAB Ect Mult Living                 Review of Systems  Constitutional: Negative for fever and fatigue.  HENT: Negative for congestion and drooling.   Eyes: Negative for pain.  Respiratory: Negative for cough and shortness of breath.   Cardiovascular: Negative for chest pain.  Gastrointestinal: Positive for nausea, vomiting, abdominal pain and diarrhea.  Genitourinary: Negative for dysuria and hematuria.  Musculoskeletal: Negative for back pain, gait problem and neck pain.  Skin: Negative for color change.  Neurological: Negative for dizziness and headaches.  Hematological: Negative for adenopathy.  Psychiatric/Behavioral: Negative for behavioral problems.  All other systems reviewed and are negative.     Allergies  Codeine  Home Medications   Prior to Admission medications   Medication Sig Start Date End Date Taking? Authorizing Provider  aspirin 81 MG tablet Take 81 mg by mouth daily.    Historical Provider, MD  atorvastatin (LIPITOR) 80 MG tablet Take 80 mg by mouth at bedtime.     Historical Provider, MD  Calcium Carbonate-Vitamin D (CALCIUM 600 + D PO) Take 600 mg by mouth daily.    Historical Provider, MD  cetirizine (ZYRTEC) 10 MG tablet Take 10  mg by mouth daily as needed. For allergies    Historical Provider, MD  Choline Fenofibrate (TRILIPIX) 135 MG capsule Take 135 mg by mouth daily.    Historical Provider, MD  dexlansoprazole (DEXILANT) 60 MG capsule Take 60 mg by mouth at bedtime.     Historical Provider, MD  doxycycline (VIBRA-TABS) 100 MG tablet Take 100 mg by mouth daily.    Historical Provider, MD  DULoxetine (CYMBALTA) 60 MG capsule Take 60 mg by mouth at bedtime.     Historical Provider, MD  estradiol (VAGIFEM) 25 MCG vaginal tablet Place 25 mcg vaginally 2 (two) times a week. On Tuesday and Friday    Historical Provider, MD  hydrALAZINE (APRESOLINE) 50 MG tablet Take 50 mg by mouth 3 (three) times daily.     Historical Provider, MD  ibuprofen (ADVIL,MOTRIN) 200 MG tablet Take 200 mg by mouth every 6 (six) hours as needed. For pain    Historical Provider, MD  linagliptin (TRADJENTA) 5 MG TABS tablet Take 5 mg by mouth at bedtime.     Historical Provider, MD  LOSARTAN POTASSIUM PO Take 100 mg by mouth daily.    Historical Provider, MD  metoCLOPramide (REGLAN) 10 MG tablet Take 10 mg by mouth at bedtime.    Historical Provider, MD  Multiple Vitamin (MULTIVITAMIN WITH MINERALS) TABS Take 1 tablet by mouth daily.    Historical Provider, MD  nystatin (MYCOSTATIN) 100000 UNIT/ML suspension  07/21/12   Historical Provider, MD  risedronate (ACTONEL) 150 MG tablet Take 150 mg by mouth every 30 (thirty) days. with water on empty stomach, nothing by mouth or lie down for next 30 minutes.    Historical Provider, MD   BP 140/60  Pulse 69  Temp(Src) 98.8 F (37.1 C) (Oral)  Resp 20  SpO2 100% Physical Exam  Nursing note and vitals reviewed. Constitutional: She is oriented to person, place, and time. She appears well-developed and well-nourished.  HENT:  Head: Normocephalic and atraumatic.  Mouth/Throat: Oropharynx is clear and moist. No oropharyngeal exudate.  Eyes: Conjunctivae and EOM are normal. Pupils are equal, round, and reactive to light.  Neck: Normal range of motion. Neck supple.  Cardiovascular: Normal rate, regular rhythm, normal heart sounds and intact distal pulses.  Exam reveals no gallop and no friction rub.   No murmur heard. Pulmonary/Chest: Effort normal and breath sounds normal. No respiratory distress. She has no wheezes.  Abdominal: Soft. Bowel sounds are normal. There is tenderness (mod ttp of epig and RUQ area). There is no rebound and no guarding.  Musculoskeletal: Normal range of motion. She exhibits no edema and no tenderness.  Neurological: She is alert and oriented to person, place, and time.  Skin: Skin is warm and dry.  Psychiatric: She has a normal mood and affect. Her  behavior is normal.    ED Course  Procedures (including critical care time) Labs Review Labs Reviewed  CBC WITH DIFFERENTIAL - Abnormal; Notable for the following:    RBC 3.42 (*)    Hemoglobin 10.0 (*)    HCT 29.7 (*)    Neutrophils Relative % 79 (*)    All other components within normal limits  BASIC METABOLIC PANEL - Abnormal; Notable for the following:    Glucose, Bld 165 (*)    BUN 26 (*)    Creatinine, Ser 1.59 (*)    GFR calc non Af Amer 33 (*)    GFR calc Af Amer 39 (*)    All other components within normal limits  URINALYSIS, ROUTINE W REFLEX MICROSCOPIC - Abnormal; Notable for the following:    Leukocytes, UA TRACE (*)    All other components within normal limits  URINE MICROSCOPIC-ADD ON - Abnormal; Notable for the following:    Bacteria, UA FEW (*)    Casts HYALINE CASTS (*)    All other components within normal limits  GLUCOSE, CAPILLARY - Abnormal; Notable for the following:    Glucose-Capillary 205 (*)    All other components within normal limits  GLUCOSE, CAPILLARY - Abnormal; Notable for the following:    Glucose-Capillary 133 (*)    All other components within normal limits  CBC - Abnormal; Notable for the following:    RBC 3.17 (*)    Hemoglobin 9.5 (*)    HCT 28.2 (*)    All other components within normal limits  BASIC METABOLIC PANEL - Abnormal; Notable for the following:    Glucose, Bld 125 (*)    Creatinine, Ser 1.34 (*)    GFR calc non Af Amer 41 (*)    GFR calc Af Amer 47 (*)    All other components within normal limits  GLUCOSE, CAPILLARY - Abnormal; Notable for the following:    Glucose-Capillary 237 (*)    All other components within normal limits  SURGICAL PCR SCREEN  HEPATIC FUNCTION PANEL  LIPASE, BLOOD  HEMOGLOBIN A1C  SAMPLE TO BLOOD BANK    Imaging Review Dg Cholangiogram Operative  04/11/2014   CLINICAL DATA:  Operative cholangiogram ; 9 seconds of fluoro time reported  EXAM: INTRAOPERATIVE CHOLANGIOGRAM  TECHNIQUE:  Cholangiographic images from the C-arm fluoroscopic device were submitted for interpretation post-operatively. Please see the procedural report for the amount of contrast and the fluoroscopy time utilized.  COMPARISON:  Abdominal and pelvic CT scan of September 19th 2015  FINDINGS: A cine loop from an operative cholangiogram is reviewed. Contrast is present within the normal-appearing common bile duct in the visualized portions of the intrahepatic ducts. There are no filling defects demonstrated. Contrast enters the normal appearing duodenum without incident.  IMPRESSION: There are no findings suspicious for a retained common bile duct stone. Normal operative cholangiogram.   Electronically Signed   By: David  Martinique   On: 04/11/2014 10:57   Ct Abdomen Pelvis W Contrast  04/10/2014   CLINICAL DATA:  Abdominal pain.  EXAM: CT ABDOMEN AND PELVIS WITH CONTRAST  TECHNIQUE: Multidetector CT imaging of the abdomen and pelvis was performed using the standard protocol following bolus administration of intravenous contrast.  CONTRAST:  41mL OMNIPAQUE IOHEXOL 300 MG/ML  SOLN  COMPARISON:  None.  FINDINGS: There is gallbladder wall thickening and dependent stones. Stones are also noted in the gallbladder neck. There is mild hazy inflammatory change in the pericholecystic fat. Findings are consistent with acute cholecystitis. Common bile duct is normal in caliber for age. There is minor central intrahepatic bile duct dilation.  Clear lung bases.  Heart is normal in size.  Liver, spleen and pancreas are unremarkable. No bile duct dilation. No adrenal masses. Small low-density lesion from the anterior margin of the midpole left kidney, likely a cyst. Kidneys otherwise unremarkable. No hydronephrosis. Normal ureters. Normal bladder.  Uterus surgically absent.  No pelvic masses.  No adenopathy.  No abnormal fluid collections.  Colon and small bowel are unremarkable.  Normal appendix.  Status post L4-L5 posterior lumbar spine  fusion. Orthopedic hardware is well-seated. No osteoblastic or osteolytic lesions.  IMPRESSION: 1. Acute cholecystitis. 2. No other acute findings.   Electronically Signed  By: Lajean Manes M.D.   On: 04/10/2014 20:13     EKG Interpretation None      MDM   Final diagnoses:  Acute cholecystitis    6:52 PM 63 y.o. female pw abd pain which began 5 days ago after eating pizza. Ongoing pain w/ eating, some emesis today. Denies fevers.   CT c/w cholecystitis. Will start cipro/flagyl. [facility] to admit.     Pamella Pert, MD 04/11/14 1151

## 2014-04-10 NOTE — ED Notes (Signed)
Pt presents with c/o abdominal pain that has been going on since Monday night. Pt went to  [facility] ER earlier this week and left due to the wait times. Pt went to her doctor yesterday and was told she has "fecal backup" and air in her abdomen. Pt reports that she took some medications and that helped to "clean her out". Pt reports that once she resumed eating, the abdominal pain was back and she has been vomiting.

## 2014-04-10 NOTE — ED Notes (Signed)
Pt ambulatory to restroom

## 2014-04-10 NOTE — H&P (Addendum)
Re:   Casey Jennings DOB:   12/02/1950 MRN:   [id number]  [facility] Admission  ASSESSMENT AND PLAN: 1.  Cholecystitis  I discussed with the patient the indications and risks of gall bladder surgery.  The primary risks of gall bladder surgery include, but are not limited to, bleeding, infection, common bile duct injury, and open surgery.  There is also the risk that the patient may have continued symptoms after surgery.  However, the likelihood of improvement in symptoms and return to the patient's normal status is good. We discussed the typical post-operative recovery course. I tried to answer the patient's questions.  Will plan surgery in AM.  Discussed admission with Dr. Hunt Oris.  2.  HTN since 2011 3.  Hyperlipidemia 4.  GERD since 2000 5.  DM since 1990  On Insulin since Feb 2015 6.  Depression 7.  Anemia - chronic  Hgb - 10.0 8.  Elevated creatinine - 1.59 10.  Rheumatoid arthritis - sees Dr. Ouida Sills 11.  Rosacea  Chief Complaint  Patient presents with  . Abdominal Pain   REFERRING PHYSICIAN: Sheela Stack, MD  HISTORY OF PRESENT ILLNESS: Casey Jennings is a 63 y.o. (DOB: 1951-06-13)  white  female whose primary care physician is Sheela Stack, MD and comes to [facility] today for abdominal pain. Her husband is with her in the ER.  She developed pain after eating pizza on Monday, 9/14.  The pain was epigastric - she tried TUMS, Pepto Bismol without improvement.  She went to the [facility] ER on Wednesday, 9/16, but could not wait how long they were wanting her to wait. Thursday AM, she saw Collie Siad in Dr. Baldwin Crown office.  She thought that Ms. Rockmore was constipated, she tried some laxative which helped her Friday.  But then last PM when she stared eating more, her pain returned and was worse.  She came to the [facility]. She has GERD - long standing.  She had a negative colonoscopy by Dr. Lana Fish in May 2014.  She had a C section.  She had a vag hysterectomy in 1992.  CT abdomen -  04/10/2014 - acute cholecystitis WBC - 8,800 - 04/10/2014 Hgb - 10.0 - 04/10/2014 Creat - 1.49 - 04/10/2014  Past Medical History  Diagnosis Date  . Hyperlipidemia   . Hypertension   . GERD (gastroesophageal reflux disease)   . Anemia   . Hypercalcemia   . Diabetes mellitus without complication   . Depression   . Osteopenia   . Arthritis   . Heart murmur   . Osteoporosis   . Chronic kidney disease     hx of uti   . Yeast infection     questinable per pt on 05/23/12- to let MD be aware of       Past Surgical History  Procedure Laterality Date  . Carpal tunnel release    . Bladder suspension    . Spine surgery    . Cesarean section    . Right ankle surgery       due to fracture   . Tonsillectomy    . Back surgery    . Parathyroidectomy  05/27/2012    Procedure: PARATHYROIDECTOMY;  Surgeon: Earnstine Regal, MD;  Location: [facility];  Service: General;  Laterality: N/A;      Current Facility-Administered Medications  Medication Dose Route Frequency Provider Last Rate Last Dose  . ciprofloxacin (CIPRO) IVPB 400 mg  400 mg Intravenous Once Pamella Pert, MD      .  metroNIDAZOLE (FLAGYL) IVPB 500 mg  500 mg Intravenous Once Pamella Pert, MD 100 mL/hr at 04/10/14 2054 500 mg at 04/10/14 2054   Current Outpatient Prescriptions  Medication Sig Dispense Refill  . aspirin 81 MG tablet Take 81 mg by mouth daily.      Marland Kitchen atorvastatin (LIPITOR) 80 MG tablet Take 80 mg by mouth at bedtime.       . Calcium Carbonate-Vitamin D (CALCIUM 600 + D PO) Take 600 mg by mouth daily.      . cetirizine (ZYRTEC) 10 MG tablet Take 10 mg by mouth daily as needed. For allergies      . Choline Fenofibrate (TRILIPIX) 135 MG capsule Take 135 mg by mouth daily.      Marland Kitchen dexlansoprazole (DEXILANT) 60 MG capsule Take 60 mg by mouth at bedtime.       Marland Kitchen doxycycline (VIBRA-TABS) 100 MG tablet Take 100 mg by mouth daily.      . folic acid (FOLVITE) 1 MG tablet Take 1 mg by mouth daily.      . insulin detemir  (LEVEMIR) 100 UNIT/ML injection Inject 40 Units into the skin at bedtime.      Marland Kitchen levothyroxine (SYNTHROID, LEVOTHROID) 100 MCG tablet Take 100 mcg by mouth daily before breakfast.      . linagliptin (TRADJENTA) 5 MG TABS tablet Take 5 mg by mouth at bedtime.       Marland Kitchen LOSARTAN POTASSIUM PO Take 100 mg by mouth daily.      . methotrexate (RHEUMATREX) 2.5 MG tablet Take 20 mg by mouth once a week. On Wednesdays.  Caution:Chemotherapy. Protect from light.      . metoCLOPramide (REGLAN) 10 MG tablet Take 10 mg by mouth at bedtime.      . metoprolol succinate (TOPROL-XL) 50 MG 24 hr tablet Take 50 mg by mouth daily. Take with or immediately following a meal.      . Multiple Vitamin (MULTIVITAMIN WITH MINERALS) TABS Take 1 tablet by mouth daily.      . naproxen sodium (ANAPROX) 220 MG tablet Take 220 mg by mouth 2 (two) times daily with a meal.      . nitroGLYCERIN (NITROSTAT) 0.4 MG SL tablet Place 0.4 mg under the tongue every 5 (five) minutes as needed for chest pain.      . Vitamin D, Ergocalciferol, (DRISDOL) 50000 UNITS CAPS capsule Take 50,000 Units by mouth. Tuesdays and Fridays.          Allergies  Allergen Reactions  . Codeine     hallucinations    REVIEW OF SYSTEMS: Skin:  Rosacea - takes Doxycycline Neurologic:  No history of stroke.  No history of seizure.  No history of headaches. Cardiac:  Had chest pain in June 2014 while in [place].  But workup was negative.  She is not sure of the cardiologists name. Pulmonary:  Does not smoke cigarettes.  No asthma or bronchitis.  No OSA/CPAP.  Endocrine: DM since 110.  On Insulin since Feb 2015.   No thyroid disease.  Parathyroidectomy by Dr. Harlow Asa in 2013. Gastrointestinal: See HPI. Urologic:  Elevated creatinine - 1.59.  She is aware that her creatinine is being followed. Musculoskeletal: Rheumatoid arthritis - sees Dr. Ouida Sills Hematologic:  No bleeding disorder.  No history of anemia.  Not anticoagulated. Psycho-social:  The  patient is oriented.   History of depression.  SOCIAL and FAMILY HISTORY: Married. 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 Has two sons:  Ages 32 and 28.  PHYSICAL EXAM: BP 117/59  Pulse 73  Temp(Src) 98 F (36.7 C) (Oral)  Resp 16  SpO2 98%  General: Moderately obese WF who is alert.  HEENT: Normal. Pupils equal. Neck: Supple. No mass.  No thyroid mass.  She has a scar at the base of her left neck. Lymph Nodes:  No supraclavicular or cervical nodes. Lungs: Clear to auscultation and symmetric breath sounds. Heart:  RRR. No murmur or rub. Abdomen: Soft. No mass. Tender and sore in her epigastrium, but this does not localize. No hernia. Normal bowel sounds.   Rectal: Not done. Extremities:  Good strength and ROM  in upper and lower extremities. Neurologic:  Grossly intact to motor and sensory function. Psychiatric: Has normal mood and affect.   DATA REVIEWED: Epic notes.  [name], MD,  [facility] Surgery, [address]  [place], [place]    [place] Phone:  [phone] FAX:  [phone]

## 2014-04-11 ENCOUNTER — Encounter (HOSPITAL_COMMUNITY): Payer: BC Managed Care – PPO | Admitting: Anesthesiology

## 2014-04-11 ENCOUNTER — Inpatient Hospital Stay (HOSPITAL_COMMUNITY): Payer: BC Managed Care – PPO

## 2014-04-11 ENCOUNTER — Inpatient Hospital Stay (HOSPITAL_COMMUNITY): Payer: BC Managed Care – PPO | Admitting: Anesthesiology

## 2014-04-11 ENCOUNTER — Encounter (HOSPITAL_COMMUNITY): Payer: Self-pay | Admitting: Anesthesiology

## 2014-04-11 ENCOUNTER — Encounter (HOSPITAL_COMMUNITY): Admission: EM | Disposition: A | Discharge status: Home / Self Care | Payer: Self-pay | Source: Home / Self Care

## 2014-04-11 DIAGNOSIS — E1143 Type 2 diabetes mellitus with diabetic autonomic (poly)neuropathy: Secondary | ICD-10-CM | POA: Diagnosis present

## 2014-04-11 DIAGNOSIS — K3184 Gastroparesis: Secondary | ICD-10-CM

## 2014-04-11 DIAGNOSIS — E118 Type 2 diabetes mellitus with unspecified complications: Secondary | ICD-10-CM | POA: Diagnosis present

## 2014-04-11 DIAGNOSIS — M069 Rheumatoid arthritis, unspecified: Secondary | ICD-10-CM | POA: Diagnosis present

## 2014-04-11 HISTORY — PX: CHOLECYSTECTOMY: SHX55

## 2014-04-11 LAB — CBC
HEMATOCRIT: 28.2 % — AB (ref 36.0–46.0)
HEMOGLOBIN: 9.5 g/dL — AB (ref 12.0–15.0)
MCH: 30 pg (ref 26.0–34.0)
MCHC: 33.7 g/dL (ref 30.0–36.0)
MCV: 89 fL (ref 78.0–100.0)
Platelets: 310 10*3/uL (ref 150–400)
RBC: 3.17 MIL/uL — ABNORMAL LOW (ref 3.87–5.11)
RDW: 13.6 % (ref 11.5–15.5)
WBC: 6.5 10*3/uL (ref 4.0–10.5)

## 2014-04-11 LAB — GLUCOSE, CAPILLARY
GLUCOSE-CAPILLARY: 133 mg/dL — AB (ref 70–99)
Glucose-Capillary: 237 mg/dL — ABNORMAL HIGH (ref 70–99)
Glucose-Capillary: 227 mg/dL — ABNORMAL HIGH (ref 70–99)
Glucose-Capillary: 255 mg/dL — ABNORMAL HIGH (ref 70–99)

## 2014-04-11 LAB — BASIC METABOLIC PANEL
Anion gap: 13 (ref 5–15)
BUN: 19 mg/dL (ref 6–23)
CHLORIDE: 105 meq/L (ref 96–112)
CO2: 21 meq/L (ref 19–32)
CREATININE: 1.34 mg/dL — AB (ref 0.50–1.10)
Calcium: 9.5 mg/dL (ref 8.4–10.5)
GFR calc non Af Amer: 41 mL/min — ABNORMAL LOW (ref >90)
GFR, EST AFRICAN AMERICAN: 47 mL/min — AB (ref >90)
GLUCOSE: 125 mg/dL — AB (ref 70–99)
POTASSIUM: 4.6 meq/L (ref 3.7–5.3)
Sodium: 139 mEq/L (ref 137–147)

## 2014-04-11 LAB — SAMPLE TO BLOOD BANK

## 2014-04-11 LAB — SURGICAL PCR SCREEN
MRSA, PCR: NEGATIVE
Staphylococcus aureus: NEGATIVE

## 2014-04-11 SURGERY — LAPAROSCOPIC CHOLECYSTECTOMY WITH INTRAOPERATIVE CHOLANGIOGRAM
Anesthesia: General | Site: Abdomen

## 2014-04-11 MED ORDER — ONDANSETRON HCL 4 MG/2ML IJ SOLN
INTRAMUSCULAR | Status: DC | PRN
  Administered 2014-04-11: 4 mg via INTRAVENOUS

## 2014-04-11 MED ORDER — LEVOTHYROXINE SODIUM 100 MCG PO TABS
100.0000 ug | ORAL_TABLET | Freq: Every day | ORAL | Status: DC
  Administered 2014-04-12 – 2014-04-13 (×2): 100 ug via ORAL
  Filled 2014-04-11 (×4): qty 1

## 2014-04-11 MED ORDER — MIDAZOLAM HCL 5 MG/5ML IJ SOLN
INTRAMUSCULAR | Status: DC | PRN
  Administered 2014-04-11 (×2): 1 mg via INTRAVENOUS

## 2014-04-11 MED ORDER — MIDAZOLAM HCL 2 MG/2ML IJ SOLN
INTRAMUSCULAR | Status: AC
  Filled 2014-04-11: qty 2

## 2014-04-11 MED ORDER — BUPIVACAINE HCL (PF) 0.5 % IJ SOLN
INTRAMUSCULAR | Status: AC
  Filled 2014-04-11: qty 30

## 2014-04-11 MED ORDER — GLYCOPYRROLATE 0.2 MG/ML IJ SOLN
INTRAMUSCULAR | Status: DC | PRN
  Administered 2014-04-11: 0.6 mg via INTRAVENOUS

## 2014-04-11 MED ORDER — FENTANYL CITRATE 0.05 MG/ML IJ SOLN
INTRAMUSCULAR | Status: AC
  Filled 2014-04-11: qty 5

## 2014-04-11 MED ORDER — CISATRACURIUM BESYLATE (PF) 10 MG/5ML IV SOLN
INTRAVENOUS | Status: DC | PRN
  Administered 2014-04-11: 4 mg via INTRAVENOUS
  Administered 2014-04-11: 2 mg via INTRAVENOUS
  Administered 2014-04-11: 1 mg via INTRAVENOUS
  Administered 2014-04-11: 2 mg via INTRAVENOUS

## 2014-04-11 MED ORDER — GLYCOPYRROLATE 0.2 MG/ML IJ SOLN
INTRAMUSCULAR | Status: AC
  Filled 2014-04-11: qty 3

## 2014-04-11 MED ORDER — METOCLOPRAMIDE HCL 10 MG PO TABS
10.0000 mg | ORAL_TABLET | Freq: Every day | ORAL | Status: DC
  Administered 2014-04-11 – 2014-04-12 (×2): 10 mg via ORAL
  Filled 2014-04-11 (×3): qty 1

## 2014-04-11 MED ORDER — ONDANSETRON HCL 4 MG/2ML IJ SOLN
INTRAMUSCULAR | Status: AC
  Filled 2014-04-11: qty 2

## 2014-04-11 MED ORDER — INSULIN DETEMIR 100 UNIT/ML ~~LOC~~ SOLN
20.0000 [IU] | Freq: Every day | SUBCUTANEOUS | Status: DC
  Administered 2014-04-11 – 2014-04-12 (×2): 20 [IU] via SUBCUTANEOUS
  Filled 2014-04-11 (×2): qty 0.2

## 2014-04-11 MED ORDER — HYDROMORPHONE HCL 1 MG/ML IJ SOLN: 0.2500 mg | INTRAMUSCULAR | Status: DC | PRN

## 2014-04-11 MED ORDER — PROPOFOL 10 MG/ML IV BOLUS: INTRAVENOUS | Status: DC | PRN

## 2014-04-11 MED ORDER — METOPROLOL SUCCINATE ER 50 MG PO TB24: 50.0000 mg | ORAL_TABLET | Freq: Every day | ORAL | Status: DC

## 2014-04-11 MED ORDER — BUPIVACAINE HCL (PF) 0.25 % IJ SOLN
INTRAMUSCULAR | Status: AC
  Filled 2014-04-11: qty 30

## 2014-04-11 MED ORDER — LINAGLIPTIN 5 MG PO TABS
5.0000 mg | ORAL_TABLET | Freq: Every day | ORAL | Status: DC
  Administered 2014-04-11: 5 mg via ORAL
  Filled 2014-04-11 (×2): qty 1

## 2014-04-11 MED ORDER — EPHEDRINE SULFATE 50 MG/ML IJ SOLN
INTRAMUSCULAR | Status: DC | PRN
  Administered 2014-04-11 (×2): 10 mg via INTRAVENOUS

## 2014-04-11 MED ORDER — LACTATED RINGERS IR SOLN
Status: DC | PRN
  Administered 2014-04-11: 3000 mL

## 2014-04-11 MED ORDER — NEOSTIGMINE METHYLSULFATE 10 MG/10ML IV SOLN
INTRAVENOUS | Status: DC | PRN
  Administered 2014-04-11: 4 mg via INTRAVENOUS

## 2014-04-11 MED ORDER — PROMETHAZINE HCL 25 MG/ML IJ SOLN
INTRAMUSCULAR | Status: AC
  Administered 2014-04-11: 12:00:00
  Filled 2014-04-11: qty 1

## 2014-04-11 MED ORDER — PROMETHAZINE HCL 25 MG/ML IJ SOLN
6.2500 mg | INTRAMUSCULAR | Status: DC | PRN
  Administered 2014-04-11: 12.5 mg via INTRAVENOUS

## 2014-04-11 MED ORDER — SUCCINYLCHOLINE CHLORIDE 20 MG/ML IJ SOLN
INTRAMUSCULAR | Status: DC | PRN
  Administered 2014-04-11: 100 mg via INTRAVENOUS

## 2014-04-11 MED ORDER — METOPROLOL SUCCINATE ER 50 MG PO TB24
50.0000 mg | ORAL_TABLET | Freq: Every day | ORAL | Status: DC
  Administered 2014-04-12: 50 mg via ORAL
  Filled 2014-04-11 (×3): qty 1

## 2014-04-11 MED ORDER — FENTANYL CITRATE 0.05 MG/ML IJ SOLN
INTRAMUSCULAR | Status: DC | PRN
  Administered 2014-04-11 (×5): 50 ug via INTRAVENOUS

## 2014-04-11 MED ORDER — PROPOFOL 10 MG/ML IV BOLUS
INTRAVENOUS | Status: AC
  Filled 2014-04-11: qty 20

## 2014-04-11 MED ORDER — BUPIVACAINE HCL (PF) 0.25 % IJ SOLN
INTRAMUSCULAR | Status: DC | PRN
  Administered 2014-04-11: 30 mL

## 2014-04-11 MED ORDER — PROPOFOL 10 MG/ML IV BOLUS
INTRAVENOUS | Status: DC | PRN
  Administered 2014-04-11: 100 mg via INTRAVENOUS

## 2014-04-11 MED ORDER — CISATRACURIUM BESYLATE 20 MG/10ML IV SOLN
INTRAVENOUS | Status: AC
  Filled 2014-04-11: qty 10

## 2014-04-11 MED ORDER — DEXAMETHASONE SODIUM PHOSPHATE 10 MG/ML IJ SOLN
INTRAMUSCULAR | Status: DC | PRN
  Administered 2014-04-11: 10 mg via INTRAVENOUS

## 2014-04-11 MED ORDER — DEXAMETHASONE SODIUM PHOSPHATE 10 MG/ML IJ SOLN
INTRAMUSCULAR | Status: AC
  Filled 2014-04-11: qty 1

## 2014-04-11 MED ORDER — LACTATED RINGERS IV SOLN
INTRAVENOUS | Status: DC | PRN
  Administered 2014-04-11 (×2): via INTRAVENOUS

## 2014-04-11 MED ORDER — 0.9 % SODIUM CHLORIDE (POUR BTL) OPTIME
TOPICAL | Status: DC | PRN
  Administered 2014-04-11: 1000 mL

## 2014-04-11 MED ORDER — POTASSIUM CHLORIDE IN NACL 20-0.45 MEQ/L-% IV SOLN
INTRAVENOUS | Status: DC
  Administered 2014-04-11 – 2014-04-12 (×2): via INTRAVENOUS
  Filled 2014-04-11 (×6): qty 1000

## 2014-04-11 SURGICAL SUPPLY — 53 items
ADH SKN CLS APL DERMABOND .7 (GAUZE/BANDAGES/DRESSINGS) ×1
APL SKNCLS STERI-STRIP NONHPOA (GAUZE/BANDAGES/DRESSINGS) ×1
APPLIER CLIP ROT 10 11.4 M/L (STAPLE) ×3
APR CLP MED LRG 11.4X10 (STAPLE) ×1
BAG SPEC RTRVL LRG 6X4 10 (ENDOMECHANICALS) ×1
BENZOIN TINCTURE PRP APPL 2/3 (GAUZE/BANDAGES/DRESSINGS) ×3 IMPLANT
CANISTER SUCTION 2500CC (MISCELLANEOUS) ×1 IMPLANT
CHLORAPREP W/TINT 26ML (MISCELLANEOUS) ×3 IMPLANT
CHOLANGIOGRAM CATH TAUT (CATHETERS) ×3 IMPLANT
CLIP APPLIE ROT 10 11.4 M/L (STAPLE) ×1 IMPLANT
CLOSURE WOUND 1/4X4 (GAUZE/BANDAGES/DRESSINGS)
COVER MAYO STAND STRL (DRAPES) IMPLANT
DECANTER SPIKE VIAL GLASS SM (MISCELLANEOUS) ×1 IMPLANT
DERMABOND ADVANCED (GAUZE/BANDAGES/DRESSINGS) ×2
DERMABOND ADVANCED .7 DNX12 (GAUZE/BANDAGES/DRESSINGS) IMPLANT
DRAPE C-ARM 42X120 X-RAY (DRAPES) ×2 IMPLANT
DRAPE LAPAROSCOPIC ABDOMINAL (DRAPES) ×3 IMPLANT
DRAPE UTILITY XL STRL (DRAPES) ×3 IMPLANT
ELECT REM PT RETURN 9FT ADLT (ELECTROSURGICAL) ×3
ELECTRODE REM PT RTRN 9FT ADLT (ELECTROSURGICAL) ×1 IMPLANT
ENDOLOOP SUT PDS II  0 18 (SUTURE) ×4
ENDOLOOP SUT PDS II 0 18 (SUTURE) IMPLANT
GAUZE SPONGE 4X4 16PLY XRAY LF (GAUZE/BANDAGES/DRESSINGS) ×3 IMPLANT
GLOVE BIOGEL PI IND STRL 6.5 (GLOVE) IMPLANT
GLOVE BIOGEL PI IND STRL 7.5 (GLOVE) IMPLANT
GLOVE BIOGEL PI INDICATOR 6.5 (GLOVE) ×4
GLOVE BIOGEL PI INDICATOR 7.5 (GLOVE) ×2
GLOVE SS BIOGEL STRL SZ 7.5 (GLOVE) IMPLANT
GLOVE SUPERSENSE BIOGEL SZ 7.5 (GLOVE) ×2
GLOVE SURG SIGNA 7.5 PF LTX (GLOVE) ×3 IMPLANT
GLOVE SURG SS PI 6.5 STRL IVOR (GLOVE) ×8 IMPLANT
GOWN SPEC L4 XLG W/TWL (GOWN DISPOSABLE) ×3 IMPLANT
GOWN STRL REUS W/ TWL XL LVL3 (GOWN DISPOSABLE) ×3 IMPLANT
GOWN STRL REUS W/TWL XL LVL3 (GOWN DISPOSABLE) ×9
HEMOSTAT SURGICEL 4X8 (HEMOSTASIS) ×3 IMPLANT
IV CATH 14GX2 1/4 (CATHETERS) ×3 IMPLANT
IV SET MACRO CATH EXT 6 LUER (IV SETS) ×3 IMPLANT
KIT BASIN OR (CUSTOM PROCEDURE TRAY) ×3 IMPLANT
NS IRRIG 1000ML POUR BTL (IV SOLUTION) ×2 IMPLANT
POUCH SPECIMEN RETRIEVAL 10MM (ENDOMECHANICALS) ×2 IMPLANT
SET IRRIG TUBING LAPAROSCOPIC (IRRIGATION / IRRIGATOR) ×3 IMPLANT
SLEEVE XCEL OPT CAN 5 100 (ENDOMECHANICALS) ×3 IMPLANT
SOLUTION ANTI FOG 6CC (MISCELLANEOUS) ×3 IMPLANT
STOPCOCK 4 WAY LG BORE MALE ST (IV SETS) ×3 IMPLANT
STRIP CLOSURE SKIN 1/4X4 (GAUZE/BANDAGES/DRESSINGS) ×1 IMPLANT
SUT VIC AB 5-0 PS2 18 (SUTURE) ×3 IMPLANT
TOWEL OR 17X26 10 PK STRL BLUE (TOWEL DISPOSABLE) ×3 IMPLANT
TOWEL OR NON WOVEN STRL DISP B (DISPOSABLE) ×3 IMPLANT
TRAY LAP CHOLE (CUSTOM PROCEDURE TRAY) ×3 IMPLANT
TROCAR BLADELESS OPT 5 100 (ENDOMECHANICALS) ×3 IMPLANT
TROCAR XCEL BLUNT TIP 100MML (ENDOMECHANICALS) ×3 IMPLANT
TROCAR XCEL NON-BLD 11X100MML (ENDOMECHANICALS) ×3 IMPLANT
TUBING INSUFFLATION 10FT LAP (TUBING) ×3 IMPLANT

## 2014-04-11 NOTE — Progress Notes (Signed)
Patient return to floor from PACU, A&Ox4, abd soft, incision sites x 3 to abd intact with no drainage. Will continue to monitor.

## 2014-04-11 NOTE — Progress Notes (Signed)
Patient off floor to surgery per nursing staff.

## 2014-04-11 NOTE — Consult Note (Signed)
Requesting Physician: [name], MD  PCP:   Sheela Stack, MD   Reason for consult: Acute Cholecystitis-perioperative management of medical issues  Chief Complaint:  Abdominal pain  HPI: Casey Jennings is a 63 year old white female with a history of insulin-dependent diabetes mellitus, type II with gastroparesis, nephropathy and retinopathy, and rheumatoid arthritis on methotrexate who presented to the emergency department with the complaint of abdominal pain. Patient states that her abdominal pain began acutely 5 days prior to admission after eating pizza. She developed severe epigastric pain which persisted over the next 2 days. She actually went to the emergency department on 9/16 but left prior to being seen due to a prolonged wait. She was seen the following day in our office by Janus Molder, nurse practitioner who did a KUB showing fecal retention. She was treated with Linzess and MiraLAX and called back the following day stating that her pain had improved.  However, she developed severe recurrent abdominal pain that awoke her at 2 AM yesterday prompting her to return to the emergency department where CT of the abdomen and pelvis showed findings consistent with acute cholecystitis. She was evaluated by general surgery and is scheduled for cholecystectomy this morning. We were asked to assist with medical issues.  Casey Jennings has had diabetes mellitus type 2 since 1991. She has been started on insulin within the past year after failing oral therapy with Tradjenta (intolerant to metformin).  She states her most recent A1c was 8.6. Fasting sugars have been around 150. She takes NovoLog 5 units prior to eat she will and reports compliance. She is on Reglan for gastroparesis. +Retinopathy. No neuropathy.  She has also been diagnosed with rheumatoid arthritis within the past 18 months. She is followed by Dr. Tobie Lords. She is on methotrexate. Initially treated with prednisone but has only  taken one week of prednisone within the past year when she had a flare in 6/15. Currently, her symptoms are well-controlled.  States that she underwent an echocardiogram and stress test in 6/15 at [facility] cardiovascular. This was negative. She's not had any exertional chest pain or shortness of breath recently despite intermittent exercise.    Currently, she reports that her abdominal pain is improved after receiving pain medication in the emergency department. She denies any fever chills or sweats. No nausea vomiting.  Review of Systems:  Review of Systems - All systems reviewed with patient and are negative as in history of present illness.  Past Medical History: Past Medical History  Diagnosis Date  . Hyperlipidemia   . Hypertension   . GERD (gastroesophageal reflux disease)   . Anemia   . Hypercalcemia   . Diabetes mellitus with gastroparesis, retinopathy, and nephropathy    . Depression   . Osteopenia   . Arthritis   . Heart murmur   . Osteoporosis   . Chronic kidney disease     Past Surgical History  Procedure Laterality Date  . Carpal tunnel release    . Bladder suspension    . Spine surgery    . Cesarean section    . Right ankle surgery       due to fracture   . Tonsillectomy    . Back surgery    . Parathyroidectomy  05/27/2012    Procedure: PARATHYROIDECTOMY;  Surgeon: Earnstine Regal, MD;  Location: [facility];  Service: General;  Laterality: N/A;    Medications: Prior to Admission medications   Medication Sig Start Date End Date Taking? Authorizing Provider  aspirin  81 MG tablet Take 81 mg by mouth daily.   Yes Historical Provider, MD  atorvastatin (LIPITOR) 80 MG tablet Take 80 mg by mouth at bedtime.    Yes Historical Provider, MD  Calcium Carbonate-Vitamin D (CALCIUM 600 + D PO) Take 600 mg by mouth daily.   Yes Historical Provider, MD  cetirizine (ZYRTEC) 10 MG tablet Take 10 mg by mouth daily as needed. For allergies   Yes Historical Provider, MD  Choline  Fenofibrate (TRILIPIX) 135 MG capsule Take 135 mg by mouth daily.   Yes Historical Provider, MD  dexlansoprazole (DEXILANT) 60 MG capsule Take 60 mg by mouth at bedtime.    Yes Historical Provider, MD  doxycycline (VIBRA-TABS) 100 MG tablet Take 100 mg by mouth daily.   Yes Historical Provider, MD  folic acid (FOLVITE) 1 MG tablet Take 1 mg by mouth daily.   Yes Historical Provider, MD  insulin detemir (LEVEMIR) 100 UNIT/ML injection Inject 40 Units into the skin at bedtime.   Yes Historical Provider, MD  levothyroxine (SYNTHROID, LEVOTHROID) 100 MCG tablet Take 100 mcg by mouth daily before breakfast.   Yes Historical Provider, MD  linagliptin (TRADJENTA) 5 MG TABS tablet Take 5 mg by mouth at bedtime.    Yes Historical Provider, MD  LOSARTAN POTASSIUM PO Take 100 mg by mouth daily.   Yes Historical Provider, MD  methotrexate (RHEUMATREX) 2.5 MG tablet Take 20 mg by mouth once a week. On Wednesdays.  Caution:Chemotherapy. Protect from light.   Yes Historical Provider, MD  metoCLOPramide (REGLAN) 10 MG tablet Take 10 mg by mouth at bedtime.   Yes Historical Provider, MD  metoprolol succinate (TOPROL-XL) 50 MG 24 hr tablet Take 50 mg by mouth daily. Take with or immediately following a meal.   Yes Historical Provider, MD  Multiple Vitamin (MULTIVITAMIN WITH MINERALS) TABS Take 1 tablet by mouth daily.   Yes Historical Provider, MD  naproxen sodium (ANAPROX) 220 MG tablet Take 220 mg by mouth 2 (two) times daily with a meal.   Yes Historical Provider, MD  nitroGLYCERIN (NITROSTAT) 0.4 MG SL tablet Place 0.4 mg under the tongue every 5 (five) minutes as needed for chest pain.   Yes Historical Provider, MD  Vitamin D, Ergocalciferol, (DRISDOL) 50000 UNITS CAPS capsule Take 50,000 Units by mouth. Tuesdays and Fridays.   Yes Historical Provider, MD    Allergies:   Allergies  Allergen Reactions  . Codeine     hallucinations    Social History:  reports that she quit smoking about 35 years ago. She  does not have any smokeless tobacco history on file. She reports that she does not drink alcohol or use illicit drugs. m 2 kids Youngest son back in [place] in [place] for theology Older son working at same place  Family History: Father died ? cause mother DM  Physical Exam: Filed Vitals:   04/10/14 1808 04/10/14 2055 04/10/14 2308 04/11/14 0407  BP: 140/60 117/59 136/52 116/56  Pulse: 69 73 77 72  Temp: 98.8 F (37.1 C) 98 F (36.7 C) 97.5 F (36.4 C) 98 F (36.7 C)  TempSrc: Oral Oral Oral Oral  Resp: 20 16 18 18   Height:   5\' 4"  (1.626 m)   Weight:   80.8 kg (178 lb 2.1 oz)   SpO2: 100% 98% 100% 96%   General appearance: Obese in no acute distress Head: Normocephalic, without obvious abnormality, atraumatic Eyes: conjunctivae/corneas clear. PERRL, EOM's intact.  No scleral icterus Nose: Nares normal. Septum midline. Mucosa  normal. No drainage or sinus tenderness. Throat: lips, mucosa, and tongue normal; teeth and gums normal Neck: no adenopathy, no carotid bruit, no JVD and thyroid not enlarged, symmetric, no tenderness/mass/nodules Resp: clear to auscultation bilaterally Cardio: regular rate and rhythm GI: soft, nondistended with normal active bowel sounds. She has significant tenderness in the epigastrium and right upper quadrant. Positive Murphy sign. No rebound or guard Extremities: extremities normal, atraumatic, no cyanosis or edema Pulses: 2+ and symmetric Lymph nodes: no cervical lymphadenopathy Neurologic: Alert and oriented X 3, normal strength and tone.   Labs on Admission:   Recent Labs  04/10/14 1839  NA 138  K 3.7  CL 101  CO2 23  GLUCOSE 165*  BUN 26*  CREATININE 1.59*  CALCIUM 9.4    Recent Labs  04/10/14 1853  AST 35  ALT 21  ALKPHOS 50  BILITOT 0.5  PROT 7.1  ALBUMIN 3.7    Recent Labs  04/10/14 1853  LIPASE 39    Recent Labs  04/10/14 1839  WBC 8.8  NEUTROABS 7.0  HGB 10.0*  HCT 29.7*  MCV 86.8  PLT 375    Lab Results   Component Value Date   INR 0.99 06/20/2010    Radiological Exams on Admission: Ct Abdomen Pelvis W Contrast  04/10/2014   CLINICAL DATA:  Abdominal pain.  EXAM: CT ABDOMEN AND PELVIS WITH CONTRAST  TECHNIQUE: Multidetector CT imaging of the abdomen and pelvis was performed using the standard protocol following bolus administration of intravenous contrast.  CONTRAST:  31mL OMNIPAQUE IOHEXOL 300 MG/ML  SOLN  COMPARISON:  None.  FINDINGS: There is gallbladder wall thickening and dependent stones. Stones are also noted in the gallbladder neck. There is mild hazy inflammatory change in the pericholecystic fat. Findings are consistent with acute cholecystitis. Common bile duct is normal in caliber for age. There is minor central intrahepatic bile duct dilation.  Clear lung bases.  Heart is normal in size.  Liver, spleen and pancreas are unremarkable. No bile duct dilation. No adrenal masses. Small low-density lesion from the anterior margin of the midpole left kidney, likely a cyst. Kidneys otherwise unremarkable. No hydronephrosis. Normal ureters. Normal bladder.  Uterus surgically absent.  No pelvic masses.  No adenopathy.  No abnormal fluid collections.  Colon and small bowel are unremarkable.  Normal appendix.  Status post L4-L5 posterior lumbar spine fusion. Orthopedic hardware is well-seated. No osteoblastic or osteolytic lesions.  IMPRESSION: 1. Acute cholecystitis. 2. No other acute findings.   Electronically Signed   By: Lajean Manes M.D.   On: 04/10/2014 20:13   Orders placed during the hospital encounter of 05/27/12  . EKG    Assessment/Plan Active Problems: 1. Acute Cholecystitis- appreciate general surgery management. Scheduled for cholecystectomy today.  Postoperative management including diet, DVT prophylaxis per general surgery. 2. Diabetes mellitus type 2 with complications-her Levemir has been held due to to n.p.o. status. Her sugar this morning is under reasonable control. We'll resume  Levemir tonight with 20 units (one half her home dose) and continue sliding scale insulin. 3. Hypertension-BP is excellent this morning.  We'll continue metoprolol and resume ARB once renal function is stable. 4. Gastroparesis due to DM-and resume Reglan postoperatively when tolerating diet. 5. Rheumatoid arthritis-hold methotrexate and resume after discharge. She has had limited steroid exposure (less than one week) this year so she does not require stress dose steroids. 6. Acute on chronic renal failure-monitor with hydration. Recheck BMET postop and in am. Continue to hold Losartan. 7. Disposition-  per general surgery- anticipate discharge in 1-2 days.  We will follow course with you to assist with medical issues.  Marton Redwood 04/11/2014, 7:13 AM

## 2014-04-11 NOTE — Anesthesia Postprocedure Evaluation (Signed)
  Anesthesia Post-op Note  Patient: Casey Jennings  Procedure(s) Performed: Procedure(s) (LRB): LAPAROSCOPIC CHOLECYSTECTOMY WITH INTRAOPERATIVE CHOLANGIOGRAM (N/A)  Patient Location: PACU  Anesthesia Type: General  Level of Consciousness: awake and alert   Airway and Oxygen Therapy: Patient Spontanous Breathing  Post-op Pain: mild  Post-op Assessment: Post-op Vital signs reviewed, Patient's Cardiovascular Status Stable, Respiratory Function Stable, Patent Airway and No signs of Nausea or vomiting  Last Vitals:  Filed Vitals:   04/11/14 1333  BP: 151/65  Pulse: 77  Temp: 36.1 C  Resp: 16    Post-op Vital Signs: stable   Complications: No apparent anesthesia complications

## 2014-04-11 NOTE — Op Note (Signed)
04/10/2014 - 04/11/2014  11:22 AM  PATIENT:  Casey Jennings, 63 y.o., female, MRN: [id number]  PREOP DIAGNOSIS:  cholecystitis  POSTOP DIAGNOSIS:   Chronic and acute cholecystitis, cholelithiasis  PROCEDURE:   Procedure(s): LAPAROSCOPIC CHOLECYSTECTOMY WITH INTRAOPERATIVE CHOLANGIOGRAM  SURGEON:   [name], M.D.  ASSISTANT:   B. Hoxworth, M.D.  ANESTHESIA:   general  Anesthesiologist: Salley Scarlet, MD CRNA: Dione Booze, CRNA  General  ASA:   3E  EBL:  minimal  ml  BLOOD ADMINISTERED: none  DRAINS: none   LOCAL MEDICATIONS USED:   30 cc 1/4% Marcaine  SPECIMEN:   Gall bladder  COUNTS CORRECT:  YES  INDICATIONS FOR PROCEDURE:  Laylonie Engblom is a 63 y.o. (DOB: 12/17/1950) white  female whose primary care physician is Sheela Stack, MD and comes for cholecystectomy.  She presented acutely to the [facility] with abdominal pain of 5 days.   The indications and risks of the gall bladder surgery were explained to the patient.  The risks include, but are not limited to, infection, bleeding, common bile duct injury and open surgery.  SURGERY:  The patient was taken to room #1 at [facility].  The abdomen was prepped with chloroprep.  The patient was Cipro and Flagyl.   A time out was held and the surgical checklist run.   An infraumbilical incision was made into the abdominal cavity.  A 12 mm Hasson trocar was inserted into the abdominal cavity through the infraumbilical incision and secured with a 0 Vicryl suture.  Three additional trocars were inserted: a 10 mm trocar in the sub-xiphoid location, a 5 mm trocar in the right mid subcostal area, and a 5 mm trocar in the right lateral subcostal area.   The abdomen was explored and the liver, stomach, and bowel that could be seen were unremarkable.  The patient is obese and has a lot of central obesity and a thick omentum.   The gall bladder was chronically inflamed and under a layer of omentum.  It had a thick rind  consistent with chronic disease.  I grasped the gall bladder and rotated it cephalad.  Disssection was carried down to the gall bladder/cystic duct junction and the cystic duct isolated.  A clip was placed on the gall bladder side of the cystic duct.   An intra-operative cholangiogram was shot.   The intra-operative cholangiogram was shot using a cut off Taut catheter placed through a 14 gauge angiocath in the RUQ.  The Taut catheter was inserted in the cut cystic duct and secured with an endoclip.  A cholangiogram was shot with 15 cc of 1/2 strength Omnipaque.  Using fluoroscopy, the cholangiogram showed the flow of contrast into the common bile duct, up the hepatic radicals, and into the duodenum.  There was no mass or obstruction.  This was a normal intra-operative cholangiogram.   The Taut catheter was removed.  The cystic duct was tied off with two endoloops. The cystic artery had been identified in the dissection and clipped.  The gall bladder was bluntly and sharpley dissected from the gall bladder bed.  Whatever debri was spilled was irrigated and sucked up.    After the gall bladder was removed from the liver, the gall bladder bed and Triangle of [place] were inspected.  There was no bleeding or bile leak.   I did place some Surgicel in the gall bladder bed. The gall bladder was placed in a endocatch bag and delivered through the umbilicus.  The  abdomen was irrigated with 3,000 cc saline.   The trocars were then removed.  I infiltrated 30cc of 1/4% Marcaine into the incisions.  The umbilical port closed with a 0 Vicryl suture and the skin closed with 5-0 vicryl.  The skin was painted with Dermabond.  The patient's sponge and needle count were correct.  The patient was transported to the RR in good condition.  [name], MD, [facility] Surgery Pager: [phone] Office phone:  [phone]

## 2014-04-11 NOTE — Anesthesia Preprocedure Evaluation (Addendum)
Anesthesia Evaluation  Patient identified by MRN, date of birth, ID band Patient awake    Reviewed: Allergy & Precautions, H&P , NPO status , Patient's Chart, lab work & pertinent test results  Airway Mallampati: II TM Distance: >3 FB Neck ROM: Full   Comment: No neck symptoms from Rheumatoid arthritis. Dental no notable dental hx.    Pulmonary neg pulmonary ROS, former smoker,  breath sounds clear to auscultation  Pulmonary exam normal       Cardiovascular hypertension, Pt. on medications and Pt. on home beta blockers + Valvular Problems/Murmurs Rhythm:Regular Rate:Normal  Most recent ECG, CXR and head CT reviewed.   Neuro/Psych PSYCHIATRIC DISORDERS Depression negative neurological ROS     GI/Hepatic Neg liver ROS, GERD-  Medicated,  Endo/Other  diabetes, Type 2, Oral Hypoglycemic Agents, Insulin Dependent  Renal/GU Renal InsufficiencyRenal diseaseCr 1.59 K 3.7  negative genitourinary   Musculoskeletal  (+) Arthritis -, Rheumatoid disorders,    Abdominal (+) + obese,   Peds negative pediatric ROS (+)  Hematology  (+) anemia ,   Anesthesia Other Findings   Reproductive/Obstetrics negative OB ROS                          Anesthesia Physical Anesthesia Plan  ASA: III  Anesthesia Plan: General   Post-op Pain Management:    Induction: Intravenous  Airway Management Planned: Oral ETT  Additional Equipment:   Intra-op Plan:   Post-operative Plan: Extubation in OR  Informed Consent: I have reviewed the patients History and Physical, chart, labs and discussed the procedure including the risks, benefits and alternatives for the proposed anesthesia with the patient or authorized representative who has indicated his/her understanding and acceptance.   Dental advisory given  Plan Discussed with: CRNA  Anesthesia Plan Comments: (Neck precautions.)       Anesthesia Quick  Evaluation

## 2014-04-11 NOTE — Progress Notes (Signed)
Pt arrived to floor room 1510 via stretcher , walked to bed, steady gait. General weakness., VS taken pt oriented to room and call bell. No complications.. pain 3/10. Initial assessment completed , will continue to monitor througout shift

## 2014-04-11 NOTE — Transfer of Care (Signed)
Immediate Anesthesia Transfer of Care Note  Patient: Casey Jennings  Procedure(s) Performed: Procedure(s): LAPAROSCOPIC CHOLECYSTECTOMY WITH INTRAOPERATIVE CHOLANGIOGRAM (N/A)  Patient Location: PACU  Anesthesia Type:General  Level of Consciousness: awake, alert , oriented and patient cooperative  Airway & Oxygen Therapy: Patient Spontanous Breathing and Patient connected to face mask oxygen  Post-op Assessment: Report given to PACU RN and Post -op Vital signs reviewed and stable  Post vital signs: Reviewed and stable  Complications: No apparent anesthesia complications

## 2014-04-12 ENCOUNTER — Encounter (HOSPITAL_COMMUNITY): Payer: Self-pay | Admitting: Surgery

## 2014-04-12 LAB — BASIC METABOLIC PANEL
Anion gap: 11 (ref 5–15)
BUN: 15 mg/dL (ref 6–23)
CALCIUM: 8.6 mg/dL (ref 8.4–10.5)
CO2: 21 mEq/L (ref 19–32)
Chloride: 96 mEq/L (ref 96–112)
Creatinine, Ser: 1.21 mg/dL — ABNORMAL HIGH (ref 0.50–1.10)
GFR calc Af Amer: 54 mL/min — ABNORMAL LOW (ref >90)
GFR calc non Af Amer: 46 mL/min — ABNORMAL LOW (ref >90)
GLUCOSE: 187 mg/dL — AB (ref 70–99)
Potassium: 4.6 mEq/L (ref 3.7–5.3)
Sodium: 128 mEq/L — ABNORMAL LOW (ref 137–147)

## 2014-04-12 LAB — HEMOGLOBIN A1C
Hgb A1c MFr Bld: 7.9 % — ABNORMAL HIGH (ref <5.7)
Mean Plasma Glucose: 180 mg/dL — ABNORMAL HIGH (ref <117)

## 2014-04-12 LAB — CBC
HEMATOCRIT: 24.8 % — AB (ref 36.0–46.0)
Hemoglobin: 8.5 g/dL — ABNORMAL LOW (ref 12.0–15.0)
MCH: 30.1 pg (ref 26.0–34.0)
MCHC: 34.3 g/dL (ref 30.0–36.0)
MCV: 87.9 fL (ref 78.0–100.0)
Platelets: 284 10*3/uL (ref 150–400)
RBC: 2.82 MIL/uL — ABNORMAL LOW (ref 3.87–5.11)
RDW: 13.3 % (ref 11.5–15.5)
WBC: 8.3 10*3/uL (ref 4.0–10.5)

## 2014-04-12 LAB — GLUCOSE, CAPILLARY
GLUCOSE-CAPILLARY: 177 mg/dL — AB (ref 70–99)
GLUCOSE-CAPILLARY: 145 mg/dL — AB (ref 70–99)
Glucose-Capillary: 123 mg/dL — ABNORMAL HIGH (ref 70–99)

## 2014-04-12 MED ORDER — PROMETHAZINE HCL 25 MG/ML IJ SOLN
12.5000 mg | Freq: Four times a day (QID) | INTRAMUSCULAR | Status: DC | PRN
  Administered 2014-04-12: 25 mg via INTRAVENOUS
  Filled 2014-04-12: qty 1

## 2014-04-12 MED ORDER — LINAGLIPTIN 5 MG PO TABS
5.0000 mg | ORAL_TABLET | Freq: Every day | ORAL | Status: DC
  Filled 2014-04-12: qty 1

## 2014-04-12 MED ORDER — HYDROCODONE-ACETAMINOPHEN 5-325 MG PO TABS
1.0000 | ORAL_TABLET | ORAL | Status: DC | PRN
  Administered 2014-04-12: 2 via ORAL
  Filled 2014-04-12: qty 2

## 2014-04-12 NOTE — Progress Notes (Signed)
Patient ID: Casey Jennings, female   DOB: 12-28-1950, 63 y.o.   MRN: [id number]     [address] [place], [place] [address]    Phone: [phone] FAX: [phone]     Subjective: No n/v.  Lots of pain, IV pain meds.  Passing flatus, voiding, ambulating.  Has not had anything PO yet.    Objective:  Vital signs:  Filed Vitals:   04/11/14 1600 04/11/14 1750 04/11/14 2057 04/12/14 0601  BP: 157/66  163/58 159/77  Pulse: 83 75 83 78  Temp: 97.3 F (36.3 C)  98.1 F (36.7 C) 97.7 F (36.5 C)  TempSrc: Oral  Oral Oral  Resp: '16  18 18  ' Height:      Weight:      SpO2: 100% 98% 99% 100%    Last BM Date: 04/10/14  Intake/Output   Yesterday:  09/20 0701 - 09/21 0700 In: 1400 [I.V.:1400] Out: 102 [Urine:1; Emesis/NG output:1; Blood:100] This shift:  Total I/O In: -  Out: 1 [Urine:1]   Physical Exam: General: Pt awake/alert/oriented x4 in no acute distress Chest: cta.  No chest wall pain w good excursion CV:  Pulses intact.  Regular rhythm Abdomen: Soft.  Nondistended.  Mildly tender at incisions only.  Incisions are c/d/i.  No evidence of peritonitis.  No incarcerated hernias. Ext:  SCDs BLE.  No mjr edema.  No cyanosis Skin: No petechiae / purpura   Problem List:   Active Problems:   Cholecystitis   Diabetes mellitus type 2 with complications   Gastroparesis due to DM   Rheumatoid arthritis    Results:   Labs: Results for orders placed during the hospital encounter of 04/10/14 (from the past 48 hour(s))  URINALYSIS, ROUTINE W REFLEX MICROSCOPIC     Status: Abnormal   Collection Time    04/10/14  6:31 PM      Result Value Ref Range   Color, Urine YELLOW  YELLOW   APPearance CLEAR  CLEAR   Specific Gravity, Urine 1.023  1.005 - 1.030   pH 5.0  5.0 - 8.0   Glucose, UA NEGATIVE  NEGATIVE mg/dL   Hgb urine dipstick NEGATIVE  NEGATIVE   Bilirubin Urine NEGATIVE  NEGATIVE   Ketones, ur  NEGATIVE  NEGATIVE mg/dL   Protein, ur NEGATIVE  NEGATIVE mg/dL   Urobilinogen, UA 1.0  0.0 - 1.0 mg/dL   Nitrite NEGATIVE  NEGATIVE   Leukocytes, UA TRACE (*) NEGATIVE  URINE MICROSCOPIC-ADD ON     Status: Abnormal   Collection Time    04/10/14  6:31 PM      Result Value Ref Range   Squamous Epithelial / LPF RARE  RARE   WBC, UA 3-6  <3 WBC/hpf   RBC / HPF 0-2  <3 RBC/hpf   Bacteria, UA FEW (*) RARE   Casts HYALINE CASTS (*) NEGATIVE  CBC WITH DIFFERENTIAL     Status: Abnormal   Collection Time    04/10/14  6:39 PM      Result Value Ref Range   WBC 8.8  4.0 - 10.5 K/uL   RBC 3.42 (*) 3.87 - 5.11 MIL/uL   Hemoglobin 10.0 (*) 12.0 - 15.0 g/dL   HCT 29.7 (*) 36.0 - 46.0 %   MCV 86.8  78.0 - 100.0 fL   MCH 29.2  26.0 - 34.0 pg   MCHC 33.7  30.0 - 36.0 g/dL   RDW 13.9  11.5 - 15.5 %   Platelets 375  150 - 400 K/uL   Neutrophils Relative % 79 (*) 43 - 77 %   Neutro Abs 7.0  1.7 - 7.7 K/uL   Lymphocytes Relative 15  12 - 46 %   Lymphs Abs 1.3  0.7 - 4.0 K/uL   Monocytes Relative 4  3 - 12 %   Monocytes Absolute 0.3  0.1 - 1.0 K/uL   Eosinophils Relative 2  0 - 5 %   Eosinophils Absolute 0.2  0.0 - 0.7 K/uL   Basophils Relative 0  0 - 1 %   Basophils Absolute 0.0  0.0 - 0.1 K/uL  BASIC METABOLIC PANEL     Status: Abnormal   Collection Time    04/10/14  6:39 PM      Result Value Ref Range   Sodium 138  137 - 147 mEq/L   Potassium 3.7  3.7 - 5.3 mEq/L   Chloride 101  96 - 112 mEq/L   CO2 23  19 - 32 mEq/L   Glucose, Bld 165 (*) 70 - 99 mg/dL   BUN 26 (*) 6 - 23 mg/dL   Creatinine, Ser 1.59 (*) 0.50 - 1.10 mg/dL   Calcium 9.4  8.4 - 10.5 mg/dL   GFR calc non Af Amer 33 (*) >90 mL/min   GFR calc Af Amer 39 (*) >90 mL/min   Comment: (NOTE)     The eGFR has been calculated using the CKD EPI equation.     This calculation has not been validated in all clinical situations.     eGFR's persistently <90 mL/min signify possible Chronic Kidney     Disease.   Anion gap 14  5 - 15   HEMOGLOBIN A1C     Status: Abnormal   Collection Time    04/10/14  6:39 PM      Result Value Ref Range   Hemoglobin A1C 7.9 (*) <5.7 %   Comment: (NOTE)                                                                               According to the ADA Clinical Practice Recommendations for 2011, when     HbA1c is used as a screening test:      >=6.5%   Diagnostic of Diabetes Mellitus               (if abnormal result is confirmed)     5.7-6.4%   Increased risk of developing Diabetes Mellitus     References:Diagnosis and Classification of Diabetes Mellitus,Diabetes     GBTD,1761,60(VPXTG 1):S62-S69 and Standards of Medical Care in             Diabetes - 2011,Diabetes Care,2011,34 (Suppl 1):S11-S61.   Mean Plasma Glucose 180 (*) <117 mg/dL   Comment: Performed at [facility]     Status: None   Collection Time    04/10/14  6:53 PM      Result Value Ref Range   Total Protein 7.1  6.0 - 8.3 g/dL   Albumin 3.7  3.5 - 5.2 g/dL   AST 35  0 - 37 U/L   ALT  21  0 - 35 U/L   Alkaline Phosphatase 50  39 - 117 U/L   Total Bilirubin 0.5  0.3 - 1.2 mg/dL   Bilirubin, Direct <0.2  0.0 - 0.3 mg/dL   Indirect Bilirubin NOT CALCULATED  0.3 - 0.9 mg/dL  LIPASE, BLOOD     Status: None   Collection Time    04/10/14  6:53 PM      Result Value Ref Range   Lipase 39  11 - 59 U/L  GLUCOSE, CAPILLARY     Status: Abnormal   Collection Time    04/10/14 11:56 PM      Result Value Ref Range   Glucose-Capillary 205 (*) 70 - 99 mg/dL  SURGICAL PCR SCREEN     Status: None   Collection Time    04/11/14 12:18 AM      Result Value Ref Range   MRSA, PCR NEGATIVE  NEGATIVE   Staphylococcus aureus NEGATIVE  NEGATIVE   Comment:            The Xpert SA Assay (FDA     approved for NASAL specimens     in patients over 45 years of age),     is one component of     a comprehensive surveillance     program.  Test performance has     been validated by [employer] for  patients greater     than or equal to 44 year old.     It is not intended     to diagnose infection nor to     guide or monitor treatment.  GLUCOSE, CAPILLARY     Status: Abnormal   Collection Time    04/11/14  7:27 AM      Result Value Ref Range   Glucose-Capillary 133 (*) 70 - 99 mg/dL  CBC     Status: Abnormal   Collection Time    04/11/14  8:58 AM      Result Value Ref Range   WBC 6.5  4.0 - 10.5 K/uL   RBC 3.17 (*) 3.87 - 5.11 MIL/uL   Hemoglobin 9.5 (*) 12.0 - 15.0 g/dL   HCT 28.2 (*) 36.0 - 46.0 %   MCV 89.0  78.0 - 100.0 fL   MCH 30.0  26.0 - 34.0 pg   MCHC 33.7  30.0 - 36.0 g/dL   RDW 13.6  11.5 - 15.5 %   Platelets 310  150 - 400 K/uL  BASIC METABOLIC PANEL     Status: Abnormal   Collection Time    04/11/14  8:58 AM      Result Value Ref Range   Sodium 139  137 - 147 mEq/L   Potassium 4.6  3.7 - 5.3 mEq/L   Comment: DELTA CHECK NOTED     NO VISIBLE HEMOLYSIS   Chloride 105  96 - 112 mEq/L   CO2 21  19 - 32 mEq/L   Glucose, Bld 125 (*) 70 - 99 mg/dL   BUN 19  6 - 23 mg/dL   Creatinine, Ser 1.34 (*) 0.50 - 1.10 mg/dL   Calcium 9.5  8.4 - 10.5 mg/dL   GFR calc non Af Amer 41 (*) >90 mL/min   GFR calc Af Amer 47 (*) >90 mL/min   Comment: (NOTE)     The eGFR has been calculated using the CKD EPI equation.     This calculation has not been validated in all clinical situations.  eGFR's persistently <90 mL/min signify possible Chronic Kidney     Disease.   Anion gap 13  5 - 15  SAMPLE TO BLOOD BANK     Status: None   Collection Time    04/11/14  8:58 AM      Result Value Ref Range   Blood Bank Specimen SAMPLE AVAILABLE FOR TESTING     Sample Expiration 04/14/2014    GLUCOSE, CAPILLARY     Status: Abnormal   Collection Time    04/11/14 11:38 AM      Result Value Ref Range   Glucose-Capillary 237 (*) 70 - 99 mg/dL   Comment 1 Documented in Chart    GLUCOSE, CAPILLARY     Status: Abnormal   Collection Time    04/11/14  4:33 PM      Result Value Ref Range    Glucose-Capillary 227 (*) 70 - 99 mg/dL   Comment 1 Notify RN    GLUCOSE, CAPILLARY     Status: Abnormal   Collection Time    04/11/14  9:00 PM      Result Value Ref Range   Glucose-Capillary 255 (*) 70 - 99 mg/dL  CBC     Status: Abnormal   Collection Time    04/12/14  5:22 AM      Result Value Ref Range   WBC 8.3  4.0 - 10.5 K/uL   RBC 2.82 (*) 3.87 - 5.11 MIL/uL   Hemoglobin 8.5 (*) 12.0 - 15.0 g/dL   HCT 24.8 (*) 36.0 - 46.0 %   MCV 87.9  78.0 - 100.0 fL   MCH 30.1  26.0 - 34.0 pg   MCHC 34.3  30.0 - 36.0 g/dL   RDW 13.3  11.5 - 15.5 %   Platelets 284  150 - 400 K/uL  BASIC METABOLIC PANEL     Status: Abnormal   Collection Time    04/12/14  5:22 AM      Result Value Ref Range   Sodium 128 (*) 137 - 147 mEq/L   Comment: DELTA CHECK NOTED     REPEATED TO VERIFY   Potassium 4.6  3.7 - 5.3 mEq/L   Chloride 96  96 - 112 mEq/L   Comment: DELTA CHECK NOTED     REPEATED TO VERIFY   CO2 21  19 - 32 mEq/L   Glucose, Bld 187 (*) 70 - 99 mg/dL   BUN 15  6 - 23 mg/dL   Creatinine, Ser 1.21 (*) 0.50 - 1.10 mg/dL   Calcium 8.6  8.4 - 10.5 mg/dL   GFR calc non Af Amer 46 (*) >90 mL/min   GFR calc Af Amer 54 (*) >90 mL/min   Comment: (NOTE)     The eGFR has been calculated using the CKD EPI equation.     This calculation has not been validated in all clinical situations.     eGFR's persistently <90 mL/min signify possible Chronic Kidney     Disease.   Anion gap 11  5 - 15  GLUCOSE, CAPILLARY     Status: Abnormal   Collection Time    04/12/14  7:28 AM      Result Value Ref Range   Glucose-Capillary 177 (*) 70 - 99 mg/dL   Comment 1 Notify RN      Imaging / Studies: Dg Cholangiogram Operative  04/11/2014   CLINICAL DATA:  Operative cholangiogram ; 9 seconds of fluoro time reported  EXAM: INTRAOPERATIVE CHOLANGIOGRAM  TECHNIQUE: Cholangiographic images from  the C-arm fluoroscopic device were submitted for interpretation post-operatively. Please see the procedural report for  the amount of contrast and the fluoroscopy time utilized.  COMPARISON:  Abdominal and pelvic CT scan of September 19th 2015  FINDINGS: A cine loop from an operative cholangiogram is reviewed. Contrast is present within the normal-appearing common bile duct in the visualized portions of the intrahepatic ducts. There are no filling defects demonstrated. Contrast enters the normal appearing duodenum without incident.  IMPRESSION: There are no findings suspicious for a retained common bile duct stone. Normal operative cholangiogram.   Electronically Signed   By: David  Martinique   On: 04/11/2014 10:57   Ct Abdomen Pelvis W Contrast  04/10/2014   CLINICAL DATA:  Abdominal pain.  EXAM: CT ABDOMEN AND PELVIS WITH CONTRAST  TECHNIQUE: Multidetector CT imaging of the abdomen and pelvis was performed using the standard protocol following bolus administration of intravenous contrast.  CONTRAST:  59m OMNIPAQUE IOHEXOL 300 MG/ML  SOLN  COMPARISON:  None.  FINDINGS: There is gallbladder wall thickening and dependent stones. Stones are also noted in the gallbladder neck. There is mild hazy inflammatory change in the pericholecystic fat. Findings are consistent with acute cholecystitis. Common bile duct is normal in caliber for age. There is minor central intrahepatic bile duct dilation.  Clear lung bases.  Heart is normal in size.  Liver, spleen and pancreas are unremarkable. No bile duct dilation. No adrenal masses. Small low-density lesion from the anterior margin of the midpole left kidney, likely a cyst. Kidneys otherwise unremarkable. No hydronephrosis. Normal ureters. Normal bladder.  Uterus surgically absent.  No pelvic masses.  No adenopathy.  No abnormal fluid collections.  Colon and small bowel are unremarkable.  Normal appendix.  Status post L4-L5 posterior lumbar spine fusion. Orthopedic hardware is well-seated. No osteoblastic or osteolytic lesions.  IMPRESSION: 1. Acute cholecystitis. 2. No other acute findings.    Electronically Signed   By: DLajean ManesM.D.   On: 04/10/2014 20:13    Medications / Allergies:  Scheduled Meds: . insulin aspart  0-20 Units Subcutaneous TID WC  . insulin detemir  20 Units Subcutaneous QHS  . levothyroxine  100 mcg Oral QAC breakfast  . linagliptin  5 mg Oral QHS  . metoCLOPramide  10 mg Oral QHS  . metoprolol succinate  50 mg Oral Daily   Continuous Infusions: . 0.45 % NaCl with KCl 20 mEq / L 100 mL/hr at 04/11/14 1301   PRN Meds:.HYDROcodone-acetaminophen, morphine injection, ondansetron  Antibiotics: Anti-infectives   Start     Dose/Rate Route Frequency Ordered Stop   04/11/14 1000  ciprofloxacin (CIPRO) IVPB 400 mg  Status:  Discontinued     400 mg 200 mL/hr over 60 Minutes Intravenous Every 12 hours 04/10/14 2321 04/12/14 0923   04/11/14 0600  metroNIDAZOLE (FLAGYL) IVPB 500 mg  Status:  Discontinued     500 mg 100 mL/hr over 60 Minutes Intravenous Every 8 hours 04/10/14 2321 04/12/14 0923   04/10/14 2100  ciprofloxacin (CIPRO) IVPB 400 mg     400 mg 200 mL/hr over 60 Minutes Intravenous  Once 04/10/14 2046 04/10/14 2307   04/10/14 2100  metroNIDAZOLE (FLAGYL) IVPB 500 mg     500 mg 100 mL/hr over 60 Minutes Intravenous  Once 04/10/14 2046 04/10/14 2204        Assessment/Plan Acute on chronic cholecystitis  POD#1 laparoscopic cholecystectomy with IOC---Dr. NLucia Gaskins-advance diet, start PO pain meds, no toradol due to sCr. -continue with IVF until oral intake  improves -mobilize -IS -SCD -will add lovenox if unable to DC today   Erby Pian, [facility] Pager [phone](7A-4:30P) For consults and floor pages call [phone](7A-4:30P)  04/12/2014 9:42 AM

## 2014-04-12 NOTE — Progress Notes (Signed)
Pt had an episode of n/v after grilled cheese and fries. Will back down to clears Suspect mild ileus Will remain inpatient over night.   Phoenix Dresser, ANP-BC

## 2014-04-12 NOTE — Progress Notes (Signed)
Subjective: Doing well overall. Small BM. Pain helped with vicodin more than morphine, to try solid foods Objective: Vital signs in last 24 hours: Temp:  [97 F (36.1 C)-98.1 F (36.7 C)] 97.7 F (36.5 C) (09/21 0601) Pulse Rate:  [73-83] 73 (09/21 0930) Resp:  [14-20] 20 (09/21 0930) BP: (131-163)/(58-77) 155/65 mmHg (09/21 0930) SpO2:  [97 %-100 %] 100 % (09/21 0930)  Intake/Output from previous day: 09/20 0701 - 09/21 0700 In: 1400 [I.V.:1400] Out: 102 [Urine:1; Emesis/NG output:1; Blood:100] Intake/Output this shift: Total I/O In: -  Out: 2 [Urine:2]  Lying flat in no distress, face is a bit puffy, lungs clear ht regular, abd distended, awake, alert, mentating well  Lab Results   Recent Labs  04/11/14 0858 04/12/14 0522  WBC 6.5 8.3  RBC 3.17* 2.82*  HGB 9.5* 8.5*  HCT 28.2* 24.8*  MCV 89.0 87.9  MCH 30.0 30.1  RDW 13.6 13.3  PLT 310 284    Recent Labs  04/11/14 0858 04/12/14 0522  NA 139 128*  K 4.6 4.6  CL 105 96  CO2 21 21  GLUCOSE 125* 187*  BUN 19 15  CREATININE 1.34* 1.21*  CALCIUM 9.5 8.6    Studies/Results: Dg Cholangiogram Operative  04/11/2014   CLINICAL DATA:  Operative cholangiogram ; 9 seconds of fluoro time reported  EXAM: INTRAOPERATIVE CHOLANGIOGRAM  TECHNIQUE: Cholangiographic images from the C-arm fluoroscopic device were submitted for interpretation post-operatively. Please see the procedural report for the amount of contrast and the fluoroscopy time utilized.  COMPARISON:  Abdominal and pelvic CT scan of September 19th 2015  FINDINGS: A cine loop from an operative cholangiogram is reviewed. Contrast is present within the normal-appearing common bile duct in the visualized portions of the intrahepatic ducts. There are no filling defects demonstrated. Contrast enters the normal appearing duodenum without incident.  IMPRESSION: There are no findings suspicious for a retained common bile duct stone. Normal operative cholangiogram.    Electronically Signed   By: David  Martinique   On: 04/11/2014 10:57   Ct Abdomen Pelvis W Contrast  04/10/2014   CLINICAL DATA:  Abdominal pain.  EXAM: CT ABDOMEN AND PELVIS WITH CONTRAST  TECHNIQUE: Multidetector CT imaging of the abdomen and pelvis was performed using the standard protocol following bolus administration of intravenous contrast.  CONTRAST:  80mL OMNIPAQUE IOHEXOL 300 MG/ML  SOLN  COMPARISON:  None.  FINDINGS: There is gallbladder wall thickening and dependent stones. Stones are also noted in the gallbladder neck. There is mild hazy inflammatory change in the pericholecystic fat. Findings are consistent with acute cholecystitis. Common bile duct is normal in caliber for age. There is minor central intrahepatic bile duct dilation.  Clear lung bases.  Heart is normal in size.  Liver, spleen and pancreas are unremarkable. No bile duct dilation. No adrenal masses. Small low-density lesion from the anterior margin of the midpole left kidney, likely a cyst. Kidneys otherwise unremarkable. No hydronephrosis. Normal ureters. Normal bladder.  Uterus surgically absent.  No pelvic masses.  No adenopathy.  No abnormal fluid collections.  Colon and small bowel are unremarkable.  Normal appendix.  Status post L4-L5 posterior lumbar spine fusion. Orthopedic hardware is well-seated. No osteoblastic or osteolytic lesions.  IMPRESSION: 1. Acute cholecystitis. 2. No other acute findings.   Electronically Signed   By: Lajean Manes M.D.   On: 04/10/2014 20:13    Scheduled Meds: . insulin aspart  0-20 Units Subcutaneous TID WC  . insulin detemir  20 Units Subcutaneous QHS  .  levothyroxine  100 mcg Oral QAC breakfast  . linagliptin  5 mg Oral QHS  . metoCLOPramide  10 mg Oral QHS  . metoprolol succinate  50 mg Oral Daily   Continuous Infusions: . 0.45 % NaCl with KCl 20 mEq / L 100 mL/hr at 04/11/14 1301   PRN Meds:HYDROcodone-acetaminophen, morphine injection, ondansetron  Assessment/Plan: Dm 2: Doing  well overall.     LOS: 2 days   Rian Koon ALAN 04/12/2014, 12:37 PM

## 2014-04-13 LAB — BASIC METABOLIC PANEL
ANION GAP: 11 (ref 5–15)
BUN: 11 mg/dL (ref 6–23)
CALCIUM: 9.2 mg/dL (ref 8.4–10.5)
CO2: 22 mEq/L (ref 19–32)
Chloride: 103 mEq/L (ref 96–112)
Creatinine, Ser: 1.21 mg/dL — ABNORMAL HIGH (ref 0.50–1.10)
GFR calc non Af Amer: 46 mL/min — ABNORMAL LOW (ref >90)
GFR, EST AFRICAN AMERICAN: 54 mL/min — AB (ref >90)
Glucose, Bld: 112 mg/dL — ABNORMAL HIGH (ref 70–99)
Potassium: 4.7 mEq/L (ref 3.7–5.3)
SODIUM: 136 meq/L — AB (ref 137–147)

## 2014-04-13 LAB — CBC
HCT: 29 % — ABNORMAL LOW (ref 36.0–46.0)
Hemoglobin: 9.6 g/dL — ABNORMAL LOW (ref 12.0–15.0)
MCH: 29.2 pg (ref 26.0–34.0)
MCHC: 33.1 g/dL (ref 30.0–36.0)
MCV: 88.1 fL (ref 78.0–100.0)
PLATELETS: 362 10*3/uL (ref 150–400)
RBC: 3.29 MIL/uL — ABNORMAL LOW (ref 3.87–5.11)
RDW: 14 % (ref 11.5–15.5)
WBC: 6.6 10*3/uL (ref 4.0–10.5)

## 2014-04-13 LAB — GLUCOSE, CAPILLARY
GLUCOSE-CAPILLARY: 180 mg/dL — AB (ref 70–99)
GLUCOSE-CAPILLARY: 116 mg/dL — AB (ref 70–99)

## 2014-04-13 MED ORDER — HYDROCODONE-ACETAMINOPHEN 5-325 MG PO TABS: 1.0000 | ORAL_TABLET | ORAL | Status: DC | PRN

## 2014-04-13 NOTE — Discharge Summary (Signed)
Physician Discharge Summary  Patient ID: Casey Jennings MRN: [id number] DOB/AGE: 1951-06-17 63 y.o.  Admit date: 04/10/2014 Discharge date: 04/13/2014  Admitting Diagnosis: Chronic and acute cholecystitis, cholelithiasis   Discharge Diagnosis Patient Active Problem List   Diagnosis Date Noted  . Diabetes mellitus type 2 with complications [id number]  . Gastroparesis due to DM 04/11/2014  . Rheumatoid arthritis 04/11/2014  . Cholecystitis 04/10/2014  . Hyperparathyroidism, primary 05/12/2012    Consultants none  Imaging: Dg Cholangiogram Operative  04/11/2014   CLINICAL DATA:  Operative cholangiogram ; 9 seconds of fluoro time reported  EXAM: INTRAOPERATIVE CHOLANGIOGRAM  TECHNIQUE: Cholangiographic images from the C-arm fluoroscopic device were submitted for interpretation post-operatively. Please see the procedural report for the amount of contrast and the fluoroscopy time utilized.  COMPARISON:  Abdominal and pelvic CT scan of September 19th 2015  FINDINGS: A cine loop from an operative cholangiogram is reviewed. Contrast is present within the normal-appearing common bile duct in the visualized portions of the intrahepatic ducts. There are no filling defects demonstrated. Contrast enters the normal appearing duodenum without incident.  IMPRESSION: There are no findings suspicious for a retained common bile duct stone. Normal operative cholangiogram.   Electronically Signed   By: David  Martinique   On: 04/11/2014 10:57    Procedures Laparoscopic cholecystectomy with IOC---Dr. Lucia Gaskins 04/11/14  Hospital Course:  Casey Jennings is a 63 year old female with a history of DM, HTN, CKD who presented to [facility] with epigastric abdominal pain.  Workup showed cholecystitis.  Patient was admitted and underwent procedure listed above.  Tolerated procedure well and was transferred to the floor.  Diet was advanced as tolerated, she had n/v which resolved with backing off high fat diet.  She was therefore  kept inpatient overnight.  On POD#2, the patient was voiding well, tolerating diet, ambulating well, pain well controlled, vital signs stable, incisions c/d/i and felt stable for discharge home.  Patient will follow up in our office in 3 weeks and knows to call with questions or concerns.  Physical Exam: General:  Alert, NAD, pleasant, comfortable Abd:  Soft, ND, mild tenderness, incisions C/D/I    Medication List         aspirin 81 MG tablet  Take 81 mg by mouth daily.     atorvastatin 80 MG tablet  Commonly known as:  LIPITOR  Take 80 mg by mouth at bedtime.     CALCIUM 600 + D PO  Take 600 mg by mouth daily.     cetirizine 10 MG tablet  Commonly known as:  ZYRTEC  Take 10 mg by mouth daily as needed. For allergies     DEXILANT 60 MG capsule  Generic drug:  dexlansoprazole  Take 60 mg by mouth at bedtime.     doxycycline 100 MG tablet  Commonly known as:  VIBRA-TABS  Take 100 mg by mouth daily.     folic acid 1 MG tablet  Commonly known as:  FOLVITE  Take 1 mg by mouth daily.     HYDROcodone-acetaminophen 5-325 MG per tablet  Commonly known as:  NORCO/VICODIN  Take 1-2 tablets by mouth every 4 (four) hours as needed for moderate pain or severe pain.     insulin detemir 100 UNIT/ML injection  Commonly known as:  LEVEMIR  Inject 40 Units into the skin at bedtime.     levothyroxine 100 MCG tablet  Commonly known as:  SYNTHROID, LEVOTHROID  Take 100 mcg by mouth daily before breakfast.  linagliptin 5 MG Tabs tablet  Commonly known as:  TRADJENTA  Take 5 mg by mouth at bedtime.     LOSARTAN POTASSIUM PO  Take 100 mg by mouth daily.     methotrexate 2.5 MG tablet  Commonly known as:  RHEUMATREX  Take 20 mg by mouth once a week. On Wednesdays.  Caution:Chemotherapy. Protect from light.     metoCLOPramide 10 MG tablet  Commonly known as:  REGLAN  Take 10 mg by mouth at bedtime.     metoprolol succinate 50 MG 24 hr tablet  Commonly known as:  TOPROL-XL   Take 50 mg by mouth daily. Take with or immediately following a meal.     multivitamin with minerals Tabs tablet  Take 1 tablet by mouth daily.     naproxen sodium 220 MG tablet  Commonly known as:  ANAPROX  Take 220 mg by mouth 2 (two) times daily with a meal.     nitroGLYCERIN 0.4 MG SL tablet  Commonly known as:  NITROSTAT  Place 0.4 mg under the tongue every 5 (five) minutes as needed for chest pain.     TRILIPIX 135 MG capsule  Generic drug:  Choline Fenofibrate  Take 135 mg by mouth daily.     Vitamin D (Ergocalciferol) 50000 UNITS Caps capsule  Commonly known as:  DRISDOL  Take 50,000 Units by mouth. Tuesdays and Fridays.             Follow-up Information   Follow up with [facility] On 04/27/2014. (arrive by 3:15PM for a 3:45PM post operative  check up)    Contact information:   [address]   [place] [address] [phone]       Signed: Erby Pian, [facility] [phone]  04/13/2014, 9:34 AM

## 2014-04-13 NOTE — Discharge Instructions (Signed)

## 2014-06-28 ENCOUNTER — Other Ambulatory Visit: Payer: Self-pay | Admitting: Nephrology

## 2014-06-28 DIAGNOSIS — N289 Disorder of kidney and ureter, unspecified: Secondary | ICD-10-CM

## 2014-06-30 ENCOUNTER — Ambulatory Visit
Admission: RE | Admit: 2014-06-30 | Discharge: 2014-06-30 | Disposition: A | Discharge status: Home / Self Care | Payer: BC Managed Care – PPO | Source: Ambulatory Visit | Attending: Nephrology | Admitting: Nephrology

## 2014-06-30 DIAGNOSIS — N289 Disorder of kidney and ureter, unspecified: Secondary | ICD-10-CM

## 2014-07-05 ENCOUNTER — Ambulatory Visit
Admission: RE | Admit: 2014-07-05 | Discharge: 2014-07-05 | Disposition: A | Discharge status: Home / Self Care | Payer: BC Managed Care – PPO | Source: Ambulatory Visit | Attending: Internal Medicine | Admitting: Internal Medicine

## 2014-07-05 ENCOUNTER — Other Ambulatory Visit: Payer: Self-pay | Admitting: Internal Medicine

## 2014-07-05 DIAGNOSIS — R1031 Right lower quadrant pain: Secondary | ICD-10-CM

## 2014-07-05 MED ORDER — IOHEXOL 300 MG/ML  SOLN
100.0000 mL | Freq: Once | INTRAMUSCULAR | Status: AC | PRN
  Administered 2014-07-05: 100 mL via INTRAVENOUS

## 2014-07-21 ENCOUNTER — Encounter: Payer: Self-pay | Admitting: Podiatry

## 2014-07-21 ENCOUNTER — Ambulatory Visit (INDEPENDENT_AMBULATORY_CARE_PROVIDER_SITE_OTHER): Payer: BC Managed Care – PPO | Admitting: Podiatry

## 2014-07-21 VITALS — BP 137/54 | HR 79 | Resp 16

## 2014-07-21 DIAGNOSIS — L600 Ingrowing nail: Secondary | ICD-10-CM

## 2014-07-21 NOTE — Patient Instructions (Signed)

## 2014-07-21 NOTE — Progress Notes (Signed)
Subjective:     Patient ID: Casey Jennings, female   DOB: [birth year], 63 y.o.   MRN: [id number]  HPI patient presents right big toe medial border stating it's been sore and patient's not been able to wear shoe gear comfortably   Review of Systems     Objective:   Physical Exam Neurovascular status intact with no other changes and noted to have good range of motion good muscle strength and well-perfused digits 1-5 both feet. Medial border right hallux is incurvated and sore when pressed    Assessment:     Ingrown toenail deformity right hallux medial border with pain    Plan:     Reviewed condition and recommended correction. Explained risk and today I infiltrated the right hallux 60 mg Xylocaine Marcaine mixture remove the medial border exposed matrix and applied phenol 3 applications 30 seconds followed by alcohol lavaged and sterile dressing. Gave instructions on soaks and reappoint

## 2014-07-28 ENCOUNTER — Other Ambulatory Visit: Payer: Self-pay | Admitting: Endocrinology

## 2014-07-28 DIAGNOSIS — N289 Disorder of kidney and ureter, unspecified: Secondary | ICD-10-CM

## 2014-08-03 ENCOUNTER — Ambulatory Visit
Admission: RE | Admit: 2014-08-03 | Discharge: 2014-08-03 | Disposition: A | Discharge status: Home / Self Care | Payer: PPO | Source: Ambulatory Visit | Attending: Endocrinology | Admitting: Endocrinology

## 2014-08-03 DIAGNOSIS — N289 Disorder of kidney and ureter, unspecified: Secondary | ICD-10-CM

## 2014-08-03 MED ORDER — GADOBENATE DIMEGLUMINE 529 MG/ML IV SOLN
15.0000 mL | Freq: Once | INTRAVENOUS | Status: AC | PRN
  Administered 2014-08-03: 15 mL via INTRAVENOUS

## 2014-11-24 ENCOUNTER — Other Ambulatory Visit: Payer: Self-pay

## 2014-11-24 DIAGNOSIS — Z1231 Encounter for screening mammogram for malignant neoplasm of breast: Secondary | ICD-10-CM

## 2014-12-21 ENCOUNTER — Ambulatory Visit: Payer: PPO

## 2015-01-18 ENCOUNTER — Ambulatory Visit
Admission: RE | Admit: 2015-01-18 | Discharge: 2015-01-18 | Disposition: A | Discharge status: Home / Self Care | Payer: PPO | Source: Ambulatory Visit

## 2015-01-18 DIAGNOSIS — Z1231 Encounter for screening mammogram for malignant neoplasm of breast: Secondary | ICD-10-CM

## 2015-08-08 DIAGNOSIS — E1129 Type 2 diabetes mellitus with other diabetic kidney complication: Secondary | ICD-10-CM | POA: Diagnosis not present

## 2015-08-08 DIAGNOSIS — Z6831 Body mass index (BMI) 31.0-31.9, adult: Secondary | ICD-10-CM | POA: Diagnosis not present

## 2015-08-08 DIAGNOSIS — K222 Esophageal obstruction: Secondary | ICD-10-CM | POA: Diagnosis not present

## 2015-08-08 DIAGNOSIS — N08 Glomerular disorders in diseases classified elsewhere: Secondary | ICD-10-CM | POA: Diagnosis not present

## 2015-08-08 DIAGNOSIS — E559 Vitamin D deficiency, unspecified: Secondary | ICD-10-CM | POA: Diagnosis not present

## 2015-08-08 DIAGNOSIS — F329 Major depressive disorder, single episode, unspecified: Secondary | ICD-10-CM | POA: Diagnosis not present

## 2015-08-08 DIAGNOSIS — F418 Other specified anxiety disorders: Secondary | ICD-10-CM | POA: Diagnosis not present

## 2015-08-08 DIAGNOSIS — E784 Other hyperlipidemia: Secondary | ICD-10-CM | POA: Diagnosis not present

## 2015-08-08 DIAGNOSIS — E11319 Type 2 diabetes mellitus with unspecified diabetic retinopathy without macular edema: Secondary | ICD-10-CM | POA: Diagnosis not present

## 2015-08-08 DIAGNOSIS — E038 Other specified hypothyroidism: Secondary | ICD-10-CM | POA: Diagnosis not present

## 2015-08-08 DIAGNOSIS — M0689 Other specified rheumatoid arthritis, multiple sites: Secondary | ICD-10-CM | POA: Diagnosis not present

## 2015-08-08 DIAGNOSIS — F419 Anxiety disorder, unspecified: Secondary | ICD-10-CM | POA: Diagnosis not present

## 2015-08-22 DIAGNOSIS — H8110 Benign paroxysmal vertigo, unspecified ear: Secondary | ICD-10-CM | POA: Diagnosis not present

## 2015-08-22 DIAGNOSIS — E871 Hypo-osmolality and hyponatremia: Secondary | ICD-10-CM | POA: Diagnosis not present

## 2015-08-22 DIAGNOSIS — E1129 Type 2 diabetes mellitus with other diabetic kidney complication: Secondary | ICD-10-CM | POA: Diagnosis not present

## 2015-08-22 DIAGNOSIS — F419 Anxiety disorder, unspecified: Secondary | ICD-10-CM | POA: Diagnosis not present

## 2015-08-22 DIAGNOSIS — Z6830 Body mass index (BMI) 30.0-30.9, adult: Secondary | ICD-10-CM | POA: Diagnosis not present

## 2015-08-22 DIAGNOSIS — E038 Other specified hypothyroidism: Secondary | ICD-10-CM | POA: Diagnosis not present

## 2015-08-26 DIAGNOSIS — H8110 Benign paroxysmal vertigo, unspecified ear: Secondary | ICD-10-CM | POA: Diagnosis not present

## 2015-08-26 DIAGNOSIS — F339 Major depressive disorder, recurrent, unspecified: Secondary | ICD-10-CM | POA: Diagnosis not present

## 2015-08-26 DIAGNOSIS — E1129 Type 2 diabetes mellitus with other diabetic kidney complication: Secondary | ICD-10-CM | POA: Diagnosis not present

## 2015-08-26 DIAGNOSIS — F419 Anxiety disorder, unspecified: Secondary | ICD-10-CM | POA: Diagnosis not present

## 2015-08-26 DIAGNOSIS — E039 Hypothyroidism, unspecified: Secondary | ICD-10-CM | POA: Diagnosis not present

## 2015-08-26 DIAGNOSIS — Z6830 Body mass index (BMI) 30.0-30.9, adult: Secondary | ICD-10-CM | POA: Diagnosis not present

## 2015-09-13 DIAGNOSIS — R42 Dizziness and giddiness: Secondary | ICD-10-CM | POA: Diagnosis not present

## 2015-09-14 DIAGNOSIS — E11319 Type 2 diabetes mellitus with unspecified diabetic retinopathy without macular edema: Secondary | ICD-10-CM | POA: Diagnosis not present

## 2015-09-14 DIAGNOSIS — I10 Essential (primary) hypertension: Secondary | ICD-10-CM | POA: Diagnosis not present

## 2015-09-14 DIAGNOSIS — E871 Hypo-osmolality and hyponatremia: Secondary | ICD-10-CM | POA: Diagnosis not present

## 2015-09-14 DIAGNOSIS — M069 Rheumatoid arthritis, unspecified: Secondary | ICD-10-CM | POA: Diagnosis not present

## 2015-09-14 DIAGNOSIS — K222 Esophageal obstruction: Secondary | ICD-10-CM | POA: Diagnosis not present

## 2015-09-14 DIAGNOSIS — N08 Glomerular disorders in diseases classified elsewhere: Secondary | ICD-10-CM | POA: Diagnosis not present

## 2015-09-14 DIAGNOSIS — Z1389 Encounter for screening for other disorder: Secondary | ICD-10-CM | POA: Diagnosis not present

## 2015-09-14 DIAGNOSIS — K3184 Gastroparesis: Secondary | ICD-10-CM | POA: Diagnosis not present

## 2015-09-14 DIAGNOSIS — E1129 Type 2 diabetes mellitus with other diabetic kidney complication: Secondary | ICD-10-CM | POA: Diagnosis not present

## 2015-09-14 DIAGNOSIS — E039 Hypothyroidism, unspecified: Secondary | ICD-10-CM | POA: Diagnosis not present

## 2015-09-14 DIAGNOSIS — F339 Major depressive disorder, recurrent, unspecified: Secondary | ICD-10-CM | POA: Diagnosis not present

## 2015-09-14 DIAGNOSIS — Z6829 Body mass index (BMI) 29.0-29.9, adult: Secondary | ICD-10-CM | POA: Diagnosis not present

## 2015-10-06 DIAGNOSIS — I10 Essential (primary) hypertension: Secondary | ICD-10-CM | POA: Diagnosis not present

## 2015-10-06 DIAGNOSIS — F411 Generalized anxiety disorder: Secondary | ICD-10-CM | POA: Diagnosis not present

## 2015-10-06 DIAGNOSIS — E1129 Type 2 diabetes mellitus with other diabetic kidney complication: Secondary | ICD-10-CM | POA: Diagnosis not present

## 2015-10-06 DIAGNOSIS — Z6829 Body mass index (BMI) 29.0-29.9, adult: Secondary | ICD-10-CM | POA: Diagnosis not present

## 2015-10-28 DIAGNOSIS — F419 Anxiety disorder, unspecified: Secondary | ICD-10-CM | POA: Diagnosis not present

## 2015-10-28 DIAGNOSIS — F339 Major depressive disorder, recurrent, unspecified: Secondary | ICD-10-CM | POA: Diagnosis not present

## 2015-10-28 DIAGNOSIS — Z6829 Body mass index (BMI) 29.0-29.9, adult: Secondary | ICD-10-CM | POA: Diagnosis not present

## 2015-11-21 DIAGNOSIS — M7061 Trochanteric bursitis, right hip: Secondary | ICD-10-CM | POA: Diagnosis not present

## 2015-11-21 DIAGNOSIS — Z79899 Other long term (current) drug therapy: Secondary | ICD-10-CM | POA: Diagnosis not present

## 2015-11-21 DIAGNOSIS — M0609 Rheumatoid arthritis without rheumatoid factor, multiple sites: Secondary | ICD-10-CM | POA: Diagnosis not present

## 2015-11-21 DIAGNOSIS — M150 Primary generalized (osteo)arthritis: Secondary | ICD-10-CM | POA: Diagnosis not present

## 2015-12-07 DIAGNOSIS — E113292 Type 2 diabetes mellitus with mild nonproliferative diabetic retinopathy without macular edema, left eye: Secondary | ICD-10-CM | POA: Diagnosis not present

## 2015-12-07 DIAGNOSIS — H04123 Dry eye syndrome of bilateral lacrimal glands: Secondary | ICD-10-CM | POA: Diagnosis not present

## 2015-12-07 DIAGNOSIS — H2513 Age-related nuclear cataract, bilateral: Secondary | ICD-10-CM | POA: Diagnosis not present

## 2015-12-07 DIAGNOSIS — E113291 Type 2 diabetes mellitus with mild nonproliferative diabetic retinopathy without macular edema, right eye: Secondary | ICD-10-CM | POA: Diagnosis not present

## 2015-12-21 DIAGNOSIS — N183 Chronic kidney disease, stage 3 (moderate): Secondary | ICD-10-CM | POA: Diagnosis not present

## 2015-12-21 DIAGNOSIS — E119 Type 2 diabetes mellitus without complications: Secondary | ICD-10-CM | POA: Diagnosis not present

## 2015-12-21 DIAGNOSIS — Z23 Encounter for immunization: Secondary | ICD-10-CM | POA: Diagnosis not present

## 2015-12-21 DIAGNOSIS — D649 Anemia, unspecified: Secondary | ICD-10-CM | POA: Diagnosis not present

## 2016-01-11 ENCOUNTER — Other Ambulatory Visit: Payer: Self-pay | Admitting: Endocrinology

## 2016-01-11 DIAGNOSIS — Z1231 Encounter for screening mammogram for malignant neoplasm of breast: Secondary | ICD-10-CM

## 2016-01-16 DIAGNOSIS — Z6830 Body mass index (BMI) 30.0-30.9, adult: Secondary | ICD-10-CM | POA: Diagnosis not present

## 2016-01-16 DIAGNOSIS — D6489 Other specified anemias: Secondary | ICD-10-CM | POA: Diagnosis not present

## 2016-01-16 DIAGNOSIS — E1129 Type 2 diabetes mellitus with other diabetic kidney complication: Secondary | ICD-10-CM | POA: Diagnosis not present

## 2016-01-16 DIAGNOSIS — M069 Rheumatoid arthritis, unspecified: Secondary | ICD-10-CM | POA: Diagnosis not present

## 2016-01-16 DIAGNOSIS — N08 Glomerular disorders in diseases classified elsewhere: Secondary | ICD-10-CM | POA: Diagnosis not present

## 2016-01-16 DIAGNOSIS — K222 Esophageal obstruction: Secondary | ICD-10-CM | POA: Diagnosis not present

## 2016-01-16 DIAGNOSIS — E559 Vitamin D deficiency, unspecified: Secondary | ICD-10-CM | POA: Diagnosis not present

## 2016-01-16 DIAGNOSIS — I10 Essential (primary) hypertension: Secondary | ICD-10-CM | POA: Diagnosis not present

## 2016-01-16 DIAGNOSIS — E038 Other specified hypothyroidism: Secondary | ICD-10-CM | POA: Diagnosis not present

## 2016-01-16 DIAGNOSIS — E784 Other hyperlipidemia: Secondary | ICD-10-CM | POA: Diagnosis not present

## 2016-01-16 DIAGNOSIS — E11319 Type 2 diabetes mellitus with unspecified diabetic retinopathy without macular edema: Secondary | ICD-10-CM | POA: Diagnosis not present

## 2016-01-16 DIAGNOSIS — E871 Hypo-osmolality and hyponatremia: Secondary | ICD-10-CM | POA: Diagnosis not present

## 2016-01-18 DIAGNOSIS — Z23 Encounter for immunization: Secondary | ICD-10-CM | POA: Diagnosis not present

## 2016-01-19 ENCOUNTER — Ambulatory Visit
Admission: RE | Admit: 2016-01-19 | Discharge: 2016-01-19 | Disposition: A | Discharge status: Home / Self Care | Payer: PPO | Source: Ambulatory Visit | Attending: Endocrinology | Admitting: Endocrinology

## 2016-01-19 DIAGNOSIS — Z1231 Encounter for screening mammogram for malignant neoplasm of breast: Secondary | ICD-10-CM

## 2016-03-13 DIAGNOSIS — I10 Essential (primary) hypertension: Secondary | ICD-10-CM | POA: Diagnosis not present

## 2016-03-13 DIAGNOSIS — Z6830 Body mass index (BMI) 30.0-30.9, adult: Secondary | ICD-10-CM | POA: Diagnosis not present

## 2016-03-13 DIAGNOSIS — N08 Glomerular disorders in diseases classified elsewhere: Secondary | ICD-10-CM | POA: Diagnosis not present

## 2016-03-13 DIAGNOSIS — E1129 Type 2 diabetes mellitus with other diabetic kidney complication: Secondary | ICD-10-CM | POA: Diagnosis not present

## 2016-03-15 DIAGNOSIS — M65322 Trigger finger, left index finger: Secondary | ICD-10-CM | POA: Diagnosis not present

## 2016-03-15 DIAGNOSIS — M150 Primary generalized (osteo)arthritis: Secondary | ICD-10-CM | POA: Diagnosis not present

## 2016-03-15 DIAGNOSIS — M65321 Trigger finger, right index finger: Secondary | ICD-10-CM | POA: Diagnosis not present

## 2016-03-15 DIAGNOSIS — M0609 Rheumatoid arthritis without rheumatoid factor, multiple sites: Secondary | ICD-10-CM | POA: Diagnosis not present

## 2016-03-15 DIAGNOSIS — Z79899 Other long term (current) drug therapy: Secondary | ICD-10-CM | POA: Diagnosis not present

## 2016-05-15 DIAGNOSIS — N08 Glomerular disorders in diseases classified elsewhere: Secondary | ICD-10-CM | POA: Diagnosis not present

## 2016-05-15 DIAGNOSIS — E871 Hypo-osmolality and hyponatremia: Secondary | ICD-10-CM | POA: Diagnosis not present

## 2016-05-15 DIAGNOSIS — F339 Major depressive disorder, recurrent, unspecified: Secondary | ICD-10-CM | POA: Diagnosis not present

## 2016-05-15 DIAGNOSIS — E1129 Type 2 diabetes mellitus with other diabetic kidney complication: Secondary | ICD-10-CM | POA: Diagnosis not present

## 2016-05-15 DIAGNOSIS — R2689 Other abnormalities of gait and mobility: Secondary | ICD-10-CM | POA: Diagnosis not present

## 2016-05-15 DIAGNOSIS — I10 Essential (primary) hypertension: Secondary | ICD-10-CM | POA: Diagnosis not present

## 2016-05-15 DIAGNOSIS — Z6828 Body mass index (BMI) 28.0-28.9, adult: Secondary | ICD-10-CM | POA: Diagnosis not present

## 2016-05-15 DIAGNOSIS — M859 Disorder of bone density and structure, unspecified: Secondary | ICD-10-CM | POA: Diagnosis not present

## 2016-05-15 DIAGNOSIS — M069 Rheumatoid arthritis, unspecified: Secondary | ICD-10-CM | POA: Diagnosis not present

## 2016-05-15 DIAGNOSIS — E559 Vitamin D deficiency, unspecified: Secondary | ICD-10-CM | POA: Diagnosis not present

## 2016-05-15 DIAGNOSIS — E784 Other hyperlipidemia: Secondary | ICD-10-CM | POA: Diagnosis not present

## 2016-06-26 DIAGNOSIS — N08 Glomerular disorders in diseases classified elsewhere: Secondary | ICD-10-CM | POA: Diagnosis not present

## 2016-06-26 DIAGNOSIS — Z6829 Body mass index (BMI) 29.0-29.9, adult: Secondary | ICD-10-CM | POA: Diagnosis not present

## 2016-06-26 DIAGNOSIS — E1129 Type 2 diabetes mellitus with other diabetic kidney complication: Secondary | ICD-10-CM | POA: Diagnosis not present

## 2016-06-26 DIAGNOSIS — I10 Essential (primary) hypertension: Secondary | ICD-10-CM | POA: Diagnosis not present

## 2016-06-28 DIAGNOSIS — H2513 Age-related nuclear cataract, bilateral: Secondary | ICD-10-CM | POA: Diagnosis not present

## 2016-06-28 DIAGNOSIS — E113292 Type 2 diabetes mellitus with mild nonproliferative diabetic retinopathy without macular edema, left eye: Secondary | ICD-10-CM | POA: Diagnosis not present

## 2016-06-28 DIAGNOSIS — E113291 Type 2 diabetes mellitus with mild nonproliferative diabetic retinopathy without macular edema, right eye: Secondary | ICD-10-CM | POA: Diagnosis not present

## 2016-08-13 DIAGNOSIS — Z79899 Other long term (current) drug therapy: Secondary | ICD-10-CM | POA: Diagnosis not present

## 2016-08-13 DIAGNOSIS — M0609 Rheumatoid arthritis without rheumatoid factor, multiple sites: Secondary | ICD-10-CM | POA: Diagnosis not present

## 2016-08-13 DIAGNOSIS — M150 Primary generalized (osteo)arthritis: Secondary | ICD-10-CM | POA: Diagnosis not present

## 2016-08-13 DIAGNOSIS — E669 Obesity, unspecified: Secondary | ICD-10-CM | POA: Diagnosis not present

## 2016-08-13 DIAGNOSIS — Z6830 Body mass index (BMI) 30.0-30.9, adult: Secondary | ICD-10-CM | POA: Diagnosis not present

## 2016-09-12 DIAGNOSIS — H8110 Benign paroxysmal vertigo, unspecified ear: Secondary | ICD-10-CM | POA: Diagnosis not present

## 2016-09-12 DIAGNOSIS — K3184 Gastroparesis: Secondary | ICD-10-CM | POA: Diagnosis not present

## 2016-09-12 DIAGNOSIS — N08 Glomerular disorders in diseases classified elsewhere: Secondary | ICD-10-CM | POA: Diagnosis not present

## 2016-09-12 DIAGNOSIS — E784 Other hyperlipidemia: Secondary | ICD-10-CM | POA: Diagnosis not present

## 2016-09-12 DIAGNOSIS — E11319 Type 2 diabetes mellitus with unspecified diabetic retinopathy without macular edema: Secondary | ICD-10-CM | POA: Diagnosis not present

## 2016-09-12 DIAGNOSIS — F339 Major depressive disorder, recurrent, unspecified: Secondary | ICD-10-CM | POA: Diagnosis not present

## 2016-09-12 DIAGNOSIS — E559 Vitamin D deficiency, unspecified: Secondary | ICD-10-CM | POA: Diagnosis not present

## 2016-09-12 DIAGNOSIS — D649 Anemia, unspecified: Secondary | ICD-10-CM | POA: Diagnosis not present

## 2016-09-12 DIAGNOSIS — R109 Unspecified abdominal pain: Secondary | ICD-10-CM | POA: Diagnosis not present

## 2016-09-12 DIAGNOSIS — E038 Other specified hypothyroidism: Secondary | ICD-10-CM | POA: Diagnosis not present

## 2016-09-12 DIAGNOSIS — E1129 Type 2 diabetes mellitus with other diabetic kidney complication: Secondary | ICD-10-CM | POA: Diagnosis not present

## 2016-09-12 DIAGNOSIS — K222 Esophageal obstruction: Secondary | ICD-10-CM | POA: Diagnosis not present

## 2016-09-12 DIAGNOSIS — E871 Hypo-osmolality and hyponatremia: Secondary | ICD-10-CM | POA: Diagnosis not present

## 2016-09-17 ENCOUNTER — Other Ambulatory Visit: Payer: Self-pay | Admitting: Endocrinology

## 2016-09-17 DIAGNOSIS — R109 Unspecified abdominal pain: Secondary | ICD-10-CM

## 2016-11-06 ENCOUNTER — Ambulatory Visit
Admission: RE | Admit: 2016-11-06 | Discharge: 2016-11-06 | Disposition: A | Discharge status: Home / Self Care | Payer: PPO | Source: Ambulatory Visit | Attending: Endocrinology | Admitting: Endocrinology

## 2016-11-06 DIAGNOSIS — N281 Cyst of kidney, acquired: Secondary | ICD-10-CM | POA: Diagnosis not present

## 2016-11-06 DIAGNOSIS — R109 Unspecified abdominal pain: Secondary | ICD-10-CM

## 2016-11-06 MED ORDER — IOPAMIDOL (ISOVUE-300) INJECTION 61%
75.0000 mL | Freq: Once | INTRAVENOUS | Status: AC | PRN
  Administered 2016-11-06: 75 mL via INTRAVENOUS

## 2016-11-12 DIAGNOSIS — Z79899 Other long term (current) drug therapy: Secondary | ICD-10-CM | POA: Diagnosis not present

## 2016-11-12 DIAGNOSIS — M150 Primary generalized (osteo)arthritis: Secondary | ICD-10-CM | POA: Diagnosis not present

## 2016-11-12 DIAGNOSIS — M0609 Rheumatoid arthritis without rheumatoid factor, multiple sites: Secondary | ICD-10-CM | POA: Diagnosis not present

## 2016-11-12 DIAGNOSIS — E669 Obesity, unspecified: Secondary | ICD-10-CM | POA: Diagnosis not present

## 2016-11-12 DIAGNOSIS — Z6831 Body mass index (BMI) 31.0-31.9, adult: Secondary | ICD-10-CM | POA: Diagnosis not present

## 2016-11-28 DIAGNOSIS — R443 Hallucinations, unspecified: Secondary | ICD-10-CM | POA: Diagnosis not present

## 2016-11-28 DIAGNOSIS — Z6830 Body mass index (BMI) 30.0-30.9, adult: Secondary | ICD-10-CM | POA: Diagnosis not present

## 2016-11-28 DIAGNOSIS — E11319 Type 2 diabetes mellitus with unspecified diabetic retinopathy without macular edema: Secondary | ICD-10-CM | POA: Diagnosis not present

## 2016-11-28 DIAGNOSIS — Z1389 Encounter for screening for other disorder: Secondary | ICD-10-CM | POA: Diagnosis not present

## 2016-11-28 DIAGNOSIS — E038 Other specified hypothyroidism: Secondary | ICD-10-CM | POA: Diagnosis not present

## 2016-11-28 DIAGNOSIS — E871 Hypo-osmolality and hyponatremia: Secondary | ICD-10-CM | POA: Diagnosis not present

## 2016-11-28 DIAGNOSIS — F339 Major depressive disorder, recurrent, unspecified: Secondary | ICD-10-CM | POA: Diagnosis not present

## 2016-11-28 DIAGNOSIS — E1129 Type 2 diabetes mellitus with other diabetic kidney complication: Secondary | ICD-10-CM | POA: Diagnosis not present

## 2016-11-28 DIAGNOSIS — I10 Essential (primary) hypertension: Secondary | ICD-10-CM | POA: Diagnosis not present

## 2016-11-29 ENCOUNTER — Other Ambulatory Visit: Payer: Self-pay | Admitting: Endocrinology

## 2016-11-29 DIAGNOSIS — R443 Hallucinations, unspecified: Secondary | ICD-10-CM

## 2016-11-29 DIAGNOSIS — R413 Other amnesia: Secondary | ICD-10-CM

## 2016-12-12 DIAGNOSIS — E119 Type 2 diabetes mellitus without complications: Secondary | ICD-10-CM | POA: Diagnosis not present

## 2016-12-12 DIAGNOSIS — Z6830 Body mass index (BMI) 30.0-30.9, adult: Secondary | ICD-10-CM | POA: Diagnosis not present

## 2016-12-12 DIAGNOSIS — R6889 Other general symptoms and signs: Secondary | ICD-10-CM | POA: Diagnosis not present

## 2016-12-12 DIAGNOSIS — N183 Chronic kidney disease, stage 3 (moderate): Secondary | ICD-10-CM | POA: Diagnosis not present

## 2016-12-12 DIAGNOSIS — M069 Rheumatoid arthritis, unspecified: Secondary | ICD-10-CM | POA: Diagnosis not present

## 2016-12-12 DIAGNOSIS — D649 Anemia, unspecified: Secondary | ICD-10-CM | POA: Diagnosis not present

## 2016-12-13 ENCOUNTER — Ambulatory Visit
Admission: RE | Admit: 2016-12-13 | Discharge: 2016-12-13 | Disposition: A | Discharge status: Home / Self Care | Payer: PPO | Source: Ambulatory Visit | Attending: Endocrinology | Admitting: Endocrinology

## 2016-12-13 DIAGNOSIS — R413 Other amnesia: Secondary | ICD-10-CM

## 2016-12-13 DIAGNOSIS — R443 Hallucinations, unspecified: Secondary | ICD-10-CM

## 2016-12-19 DIAGNOSIS — R933 Abnormal findings on diagnostic imaging of other parts of digestive tract: Secondary | ICD-10-CM | POA: Diagnosis not present

## 2016-12-19 DIAGNOSIS — Z86010 Personal history of colonic polyps: Secondary | ICD-10-CM | POA: Diagnosis not present

## 2016-12-24 ENCOUNTER — Encounter: Payer: Self-pay | Admitting: Neurology

## 2016-12-24 ENCOUNTER — Ambulatory Visit (INDEPENDENT_AMBULATORY_CARE_PROVIDER_SITE_OTHER): Payer: PPO | Admitting: Neurology

## 2016-12-24 VITALS — BP 128/72 | HR 79 | Ht 64.0 in | Wt 173.2 lb

## 2016-12-24 DIAGNOSIS — R413 Other amnesia: Secondary | ICD-10-CM | POA: Insufficient documentation

## 2016-12-24 DIAGNOSIS — E118 Type 2 diabetes mellitus with unspecified complications: Secondary | ICD-10-CM

## 2016-12-24 DIAGNOSIS — Z794 Long term (current) use of insulin: Secondary | ICD-10-CM

## 2016-12-24 DIAGNOSIS — R441 Visual hallucinations: Secondary | ICD-10-CM | POA: Insufficient documentation

## 2016-12-24 MED ORDER — DONEPEZIL HCL 10 MG PO TABS: 10.0000 mg | ORAL_TABLET | Freq: Every day | ORAL | 11 refills | Status: DC

## 2016-12-24 NOTE — Progress Notes (Addendum)
PATIENT: Casey Jennings DOB: 1950/11/19  Chief Complaint  Patient presents with  . Memory Loss    MMSE 26/30 - 7 animals.  She is here with her husband, Casey Jennings, to discuss her steadily declining memory over the last two years.  More recently, her husband says she been experiencing hallucinations.  He gave the example of her seeing bugs that are not there. They would like to further discuss her recent MRI.     HISTORICAL  Casey Jennings is a 66 years old right-handed female, accompanied by her husband, seen in refer by her primary care physician Dr. Forde Dandy, Annie Main for evaluation of memory loss,  initial evaluation was on December 24 2016.  I reviewed and strength summarized a referral note, she had a past medical history of hypertension, hyperlipidemia, gastritis, anemia, colon polyps, had a history of bilateral carpal tunnel release surgery, bladder tacker, lumbar L4-5 transforaminal interbody fusion, parathyroidectomy in 2013, consistent with parathyroid adenoma, diabetes, insulin-dependent for few years.  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  She was noted to have gradually onset memory loss since 2017, especially after they went through distress of dealing with a premature grandson, she described difficulty remembering peoples name, name of the song, gradually getting worse, since April 2018, she also developed visual hallucination, describing transparent bugs crawling over her body, and her bed,  She sleeps long hours, with some sleep, sometimes missing her daily insulin dose and frequent blood glucose check, She denies gait abnormality, denies REM sleep disorder  I reviewed the laboratory evaluations in May 2018, CMP elevated glucose 274, creatinine 0.8 normal TSH   Have personally reviewed MRI of the brain without contrast in May 2018, generalized  atrophy supratentorium small vessel disease no acute abnormalities, progression of atrophy compared to 2014  REVIEW OF SYSTEMS: Full 14 system review of systems performed and notable only for As above  ALLERGIES: Allergies  Allergen Reactions  . Codeine     hallucinations    HOME MEDICATIONS: Current Outpatient Prescriptions  Medication Sig Dispense Refill  . aspirin 81 MG tablet Take 81 mg by mouth daily.    Marland Kitchen atorvastatin (LIPITOR) 80 MG tablet Take 80 mg by mouth at bedtime.     . Calcium Carbonate-Vitamin D (CALCIUM 600 + D PO) Take 600 mg by mouth daily.    . cetirizine (ZYRTEC) 10 MG tablet Take 10 mg by mouth daily as needed. For allergies    . Choline Fenofibrate (TRILIPIX) 135 MG capsule Take 135 mg by mouth daily.    Marland Kitchen dexlansoprazole (DEXILANT) 60 MG capsule Take 60 mg by mouth at bedtime.     . folic acid (FOLVITE) 1 MG tablet Take 1 mg by mouth daily.    Marland Kitchen HYDROcodone-acetaminophen (NORCO/VICODIN) 5-325 MG per tablet Take 1-2 tablets by mouth every 4 (four) hours as needed for moderate pain or severe pain. 30 tablet 0  . insulin detemir (LEVEMIR) 100 UNIT/ML injection Inject 40 Units into the skin at bedtime.    Marland Kitchen levothyroxine (SYNTHROID, LEVOTHROID) 100 MCG tablet Take 100 mcg by mouth daily before breakfast.    . linagliptin (TRADJENTA) 5 MG TABS tablet Take 5 mg by mouth at bedtime.     Marland Kitchen LOSARTAN POTASSIUM PO Take 100 mg by mouth daily.    . methotrexate (RHEUMATREX) 2.5 MG tablet Take 20 mg by mouth once  a week. On Wednesdays.  Caution:Chemotherapy. Protect from light.    . metoCLOPramide (REGLAN) 10 MG tablet Take 10 mg by mouth at bedtime.    . metoprolol succinate (TOPROL-XL) 50 MG 24 hr tablet Take 50 mg by mouth daily. Take with or immediately following a meal.    . Multiple Vitamin (MULTIVITAMIN WITH MINERALS) TABS Take 1 tablet by mouth daily.    . naproxen sodium (ANAPROX) 220 MG tablet Take 220 mg by mouth 2 (two) times daily with a meal.    . nitroGLYCERIN  (NITROSTAT) 0.4 MG SL tablet Place 0.4 mg under the tongue every 5 (five) minutes as needed for chest pain.    . Vitamin D, Ergocalciferol, (DRISDOL) 50000 UNITS CAPS capsule Take 50,000 Units by mouth. Tuesdays and Fridays.     No current facility-administered medications for this visit.     PAST MEDICAL HISTORY: Past Medical History:  Diagnosis Date  . Anemia   . Arthritis   . Chronic kidney disease    hx of uti   . Depression   . Diabetes mellitus without complication ([place])   . GERD (gastroesophageal reflux disease)   . Heart murmur   . Hypercalcemia   . Hyperlipidemia   . Hypertension   . Memory loss   . Osteopenia   . Osteoporosis   . Yeast infection    questinable per pt on 05/23/12- to let MD be aware of     PAST SURGICAL HISTORY: Past Surgical History:  Procedure Laterality Date  . BACK SURGERY    . BLADDER SUSPENSION    . CARPAL TUNNEL RELEASE    . CESAREAN SECTION    . CHOLECYSTECTOMY N/A 04/11/2014   Procedure: LAPAROSCOPIC CHOLECYSTECTOMY WITH INTRAOPERATIVE CHOLANGIOGRAM;  Surgeon: [name], MD;  Location: [facility];  Service: General;  Laterality: N/A;  . PARATHYROIDECTOMY  05/27/2012   Procedure: PARATHYROIDECTOMY;  Surgeon: Earnstine Regal, MD;  Location: [facility];  Service: General;  Laterality: N/A;  . right ankle surgery      due to fracture   . SPINE SURGERY    . TONSILLECTOMY      FAMILY HISTORY: Family History  Problem Relation Age of Onset  . Diabetes Mother   . Heart attack Father     SOCIAL HISTORY:  Social History   Social History  . Marital status: Married    Spouse name: N/A  . Number of children: 2  . Years of education: some college   Occupational History  . Retired    Social History Main Topics  . Smoking status: Former Smoker    Quit date: 05/07/1978  . Smokeless tobacco: Never Used  . Alcohol use Yes     Comment:  1- 2 times per year  . Drug use: No  . Sexual activity: Not on file   Other Topics Concern  . Not on  file   Social History Narrative   Lives at home with her husband.   Right-handed.   2-3 glasses of diet Coke daily.     PHYSICAL EXAM   Vitals:   12/24/16 1542  BP: 128/72  Pulse: 79  Weight: 173 lb 4 oz (78.6 kg)  Height: 5\' 4"  (1.626 m)    Not recorded      Body mass index is 29.74 kg/m.  PHYSICAL EXAMNIATION:  Gen: NAD, conversant, well nourised, obese, well groomed                     Cardiovascular: Regular  rate rhythm, no peripheral edema, warm, nontender. Eyes: Conjunctivae clear without exudates or hemorrhage Neck: Supple, no carotid bruits. Pulmonary: Clear to auscultation bilaterally   NEUROLOGICAL EXAM:  MENTAL STATUS: MMSE - Mini Mental State Exam 12/24/2016  Orientation to time 5  Orientation to Place 5  Registration 3  Attention/ Calculation 3  Recall 2  Language- name 2 objects 2  Language- repeat 1  Language- follow 3 step command 3  Language- read & follow direction 1  Write a sentence 1  Copy design 0  Total score 26  animal naming 7   CRANIAL NERVES: CN II: Visual fields are full to confrontation. Fundoscopic exam is normal with sharp discs and no vascular changes. Pupils are round equal and briskly reactive to light. CN III, IV, VI: extraocular movement are normal. No ptosis. CN V: Facial sensation is intact to pinprick in all 3 divisions bilaterally. Corneal responses are intact.  CN VII: Face is symmetric with normal eye closure and smile. CN VIII: Hearing is normal to rubbing fingers CN IX, X: Palate elevates symmetrically. Phonation is normal. CN XI: Head turning and shoulder shrug are intact CN XII: Tongue is midline with normal movements and no atrophy.  MOTOR: There is no pronator drift of out-stretched arms. Muscle bulk and tone are normal. Muscle strength is normal.  REFLEXES: Reflexes are 2+ and symmetric at the biceps, triceps, knees, and ankles. Plantar responses are flexor.  SENSORY: Intact to light touch, pinprick,  positional sensation and vibratory sensation are intact in fingers and toes.  COORDINATION: Rapid alternating movements and fine finger movements are intact. There is no dysmetria on finger-to-nose and heel-knee-shin.    GAIT/STANCE: Posture is normal. Gait is steady with normal steps, base, arm swing, and turning. Heel and toe walking are normal. Tandem gait is normal.  Romberg is absent.   DIAGNOSTIC DATA (LABS, IMAGING, TESTING) - I reviewed patient records, labs, notes, testing and imaging myself where available.   ASSESSMENT AND PLAN  Marijayne Rauth is a 66 y.o. female   Mild cognitive impairment Visual hallucinations  Most consistent with central nervous system degenerative disorder,  Laboratory evaluation to rule out treatable etiology  Starting Aricept   Marcial Pacas, M.D. Ph.D.  [facility] [address] [place], [place] [address] Ph: [phone] Fax: [phone]  CC: Reynold Bowen, MD

## 2016-12-25 LAB — CBC WITH DIFFERENTIAL/PLATELET
BASOS ABS: 0 10*3/uL (ref 0.0–0.2)
Basos: 0 %
EOS (ABSOLUTE): 0.1 10*3/uL (ref 0.0–0.4)
EOS: 1 %
HEMATOCRIT: 31.6 % — AB (ref 34.0–46.6)
Hemoglobin: 10.5 g/dL — ABNORMAL LOW (ref 11.1–15.9)
Immature Grans (Abs): 0 10*3/uL (ref 0.0–0.1)
Immature Granulocytes: 0 %
LYMPHS ABS: 1.5 10*3/uL (ref 0.7–3.1)
Lymphs: 27 %
MCH: 30.3 pg (ref 26.6–33.0)
MCHC: 33.2 g/dL (ref 31.5–35.7)
MCV: 91 fL (ref 79–97)
MONOS ABS: 0.3 10*3/uL (ref 0.1–0.9)
Monocytes: 6 %
NEUTROS PCT: 66 %
Neutrophils Absolute: 3.8 10*3/uL (ref 1.4–7.0)
PLATELETS: 305 10*3/uL (ref 150–379)
RBC: 3.46 x10E6/uL — ABNORMAL LOW (ref 3.77–5.28)
RDW: 14.2 % (ref 12.3–15.4)
WBC: 5.7 10*3/uL (ref 3.4–10.8)

## 2016-12-25 LAB — CK: Total CK: 101 U/L (ref 24–173)

## 2016-12-25 LAB — ANA W/REFLEX: Anti Nuclear Antibody(ANA): NEGATIVE

## 2016-12-25 LAB — VITAMIN B12: Vitamin B-12: 1102 pg/mL (ref 232–1245)

## 2016-12-25 LAB — FOLATE: Folate: 11.6 ng/mL (ref >3.0)

## 2016-12-25 LAB — HGB A1C W/O EAG: HEMOGLOBIN A1C: 7.5 % — AB (ref 4.8–5.6)

## 2016-12-25 LAB — VITAMIN D 25 HYDROXY (VIT D DEFICIENCY, FRACTURES): Vit D, 25-Hydroxy: 97.9 ng/mL (ref 30.0–100.0)

## 2016-12-25 LAB — RPR: RPR Ser Ql: NONREACTIVE

## 2016-12-25 LAB — C-REACTIVE PROTEIN: CRP: 3.1 mg/L (ref 0.0–4.9)

## 2016-12-26 ENCOUNTER — Telehealth: Payer: Self-pay | Admitting: Neurology

## 2016-12-26 NOTE — Telephone Encounter (Signed)
Please call patient laboratory evaluation showed mild anemia which is at her baseline, A1c 7.5,  Rest of the laboratory evaluation showed no significant abnormality.

## 2016-12-27 NOTE — Telephone Encounter (Signed)
Patient aware of results and will follow up w/ PCP concerning her A1C.

## 2017-01-22 DIAGNOSIS — H52223 Regular astigmatism, bilateral: Secondary | ICD-10-CM | POA: Diagnosis not present

## 2017-01-22 DIAGNOSIS — H524 Presbyopia: Secondary | ICD-10-CM | POA: Diagnosis not present

## 2017-01-22 DIAGNOSIS — H5203 Hypermetropia, bilateral: Secondary | ICD-10-CM | POA: Diagnosis not present

## 2017-01-22 DIAGNOSIS — H2513 Age-related nuclear cataract, bilateral: Secondary | ICD-10-CM | POA: Diagnosis not present

## 2017-01-22 DIAGNOSIS — E113293 Type 2 diabetes mellitus with mild nonproliferative diabetic retinopathy without macular edema, bilateral: Secondary | ICD-10-CM | POA: Diagnosis not present

## 2017-02-25 ENCOUNTER — Encounter: Payer: Self-pay | Admitting: Neurology

## 2017-02-25 ENCOUNTER — Telehealth: Payer: Self-pay | Admitting: Neurology

## 2017-02-25 ENCOUNTER — Ambulatory Visit (INDEPENDENT_AMBULATORY_CARE_PROVIDER_SITE_OTHER): Payer: PPO | Admitting: Neurology

## 2017-02-25 VITALS — BP 104/60 | HR 72 | Ht 64.0 in | Wt 174.5 lb

## 2017-02-25 DIAGNOSIS — G309 Alzheimer's disease, unspecified: Secondary | ICD-10-CM

## 2017-02-25 DIAGNOSIS — F0281 Dementia in other diseases classified elsewhere with behavioral disturbance: Secondary | ICD-10-CM

## 2017-02-25 DIAGNOSIS — F02818 Dementia in other diseases classified elsewhere, unspecified severity, with other behavioral disturbance: Secondary | ICD-10-CM | POA: Insufficient documentation

## 2017-02-25 NOTE — Progress Notes (Signed)
PATIENT: Casey Jennings DOB: 08/30/1950  Chief Complaint  Patient presents with  . Follow-up    2 mo RV    . Memory Loss    continues with seeing bugs, gotten worse, taking over her life"     HISTORICAL  Lakyla Biswas is a 66 years old right-handed female, accompanied by her husband, seen in refer by her primary care physician Dr. Forde Dandy, Annie Main for evaluation of memory loss,  initial evaluation was on December 24 2016.  I reviewed and strength summarized a referral note, she had a past medical history of hypertension, hyperlipidemia, gastritis, anemia, colon polyps, had a history of bilateral carpal tunnel release surgery, bladder tacker, lumbar L4-5 transforaminal interbody fusion, parathyroidectomy in 2013, consistent with parathyroid adenoma, diabetes, insulin-dependent for few years.  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  She was noted to have gradually onset memory loss since 2017, especially after they went through distress of dealing with a premature grandson, she described difficulty remembering peoples name, name of the song, gradually getting worse, since April 2018, she also developed visual hallucination, describing transparent bugs crawling over her body, and her bed,  She sleeps long hours, with some sleep, sometimes missing her daily insulin dose and frequent blood glucose check, She denies gait abnormality, denies REM sleep disorder  I reviewed the laboratory evaluations in May 2018, CMP elevated glucose 274, creatinine 0.8 normal TSH   Have personally reviewed MRI of the brain without contrast in May 2018, generalized atrophy supratentorium small vessel disease no acute abnormalities, progression of atrophy compared to 2014  UPDATE August 6th 2018: Laboratory evaluation in June 2018, normal vitamin D, CPK, C-reactive protein folic  acid, RPR, vitamin B12, ANA, CBC showed hemoglobin of 10.5, A1c was elevated 7.5  REVIEW OF SYSTEMS: Full 14 system review of systems performed and notable only for As above  ALLERGIES: Allergies  Allergen Reactions  . Codeine     hallucinations    HOME MEDICATIONS: Current Outpatient Prescriptions  Medication Sig Dispense Refill  . aspirin 81 MG tablet Take 81 mg by mouth daily.    Marland Kitchen atorvastatin (LIPITOR) 80 MG tablet Take 80 mg by mouth at bedtime.     . Calcium Carbonate-Vitamin D (CALCIUM 600 + D PO) Take 600 mg by mouth daily.    . Choline Fenofibrate (TRILIPIX) 135 MG capsule Take 135 mg by mouth daily.    Marland Kitchen dexlansoprazole (DEXILANT) 60 MG capsule Take 60 mg by mouth at bedtime.     . donepezil (ARICEPT) 10 MG tablet Take 1 tablet (10 mg total) by mouth at bedtime. 30 tablet 11  . doxycycline (VIBRAMYCIN) 100 MG capsule Take 100 mg by mouth 2 (two) times daily.    . folic acid (FOLVITE) 1 MG tablet Take 1 mg by mouth daily.    . insulin detemir (LEVEMIR) 100 UNIT/ML injection Inject 40 Units into the skin at bedtime.    Marland Kitchen levothyroxine (SYNTHROID, LEVOTHROID) 100 MCG tablet Take 100 mcg by mouth daily before breakfast.    . linagliptin (TRADJENTA) 5 MG TABS tablet Take 5 mg by mouth at bedtime.     Marland Kitchen losartan-hydrochlorothiazide (HYZAAR) 100-12.5 MG tablet Take 1 tablet by mouth daily.  2  . methotrexate (RHEUMATREX) 2.5 MG tablet Take 20 mg by mouth once a week. On Wednesdays.  Caution:Chemotherapy. Protect from light.    Marland Kitchen  metoCLOPramide (REGLAN) 10 MG tablet Take 10 mg by mouth 3 (three) times daily before meals.     . metoprolol succinate (TOPROL-XL) 50 MG 24 hr tablet Take 50 mg by mouth daily. Take with or immediately following a meal.    . Multiple Vitamin (MULTIVITAMIN WITH MINERALS) TABS Take 1 tablet by mouth daily.    . nitroGLYCERIN (NITROSTAT) 0.4 MG SL tablet Place 0.4 mg under the tongue every 5 (five) minutes as needed for chest pain.    . Omega-3 Fatty Acids  (FISH OIL PO) Take 1,200 mg by mouth daily.    . Vitamin D, Ergocalciferol, (DRISDOL) 50000 UNITS CAPS capsule Take 50,000 Units by mouth. Tuesdays and Fridays.     No current facility-administered medications for this visit.     PAST MEDICAL HISTORY: Past Medical History:  Diagnosis Date  . Anemia   . Arthritis   . Chronic kidney disease    hx of uti   . Depression   . Diabetes mellitus without complication ([place])   . GERD (gastroesophageal reflux disease)   . Heart murmur   . Hypercalcemia   . Hyperlipidemia   . Hypertension   . Memory loss   . Osteopenia   . Osteoporosis   . Yeast infection    questinable per pt on 05/23/12- to let MD be aware of     PAST SURGICAL HISTORY: Past Surgical History:  Procedure Laterality Date  . BACK SURGERY    . BLADDER SUSPENSION    . CARPAL TUNNEL RELEASE    . CESAREAN SECTION    . CHOLECYSTECTOMY N/A 04/11/2014   Procedure: LAPAROSCOPIC CHOLECYSTECTOMY WITH INTRAOPERATIVE CHOLANGIOGRAM;  Surgeon: [name], MD;  Location: [facility];  Service: General;  Laterality: N/A;  . PARATHYROIDECTOMY  05/27/2012   Procedure: PARATHYROIDECTOMY;  Surgeon: Earnstine Regal, MD;  Location: [facility];  Service: General;  Laterality: N/A;  . right ankle surgery      due to fracture   . SPINE SURGERY    . TONSILLECTOMY      FAMILY HISTORY: Family History  Problem Relation Age of Onset  . Diabetes Mother   . Heart attack Father     SOCIAL HISTORY:  Social History   Social History  . Marital status: Married    Spouse name: N/A  . Number of children: 2  . Years of education: some college   Occupational History  . Retired    Social History Main Topics  . Smoking status: Former Smoker    Quit date: 05/07/1978  . Smokeless tobacco: Never Used  . Alcohol use Yes     Comment:  1- 2 times per year  . Drug use: No  . Sexual activity: Not on file   Other Topics Concern  . Not on file   Social History Narrative   Lives at home with her  husband.   Right-handed.   2-3 glasses of diet Coke daily.     PHYSICAL EXAM   Vitals:   02/25/17 1349  BP: 104/60  Pulse: 72  Weight: 174 lb 8 oz (79.2 kg)  Height: 5\' 4"  (1.626 m)    Not recorded      Body mass index is 29.95 kg/m.  PHYSICAL EXAMNIATION:  Gen: NAD, conversant, well nourised, obese, well groomed                     Cardiovascular: Regular rate rhythm, no peripheral edema, warm, nontender. Eyes: Conjunctivae clear without exudates or hemorrhage  Neck: Supple, no carotid bruits. Pulmonary: Clear to auscultation bilaterally   NEUROLOGICAL EXAM:  MENTAL STATUS: MMSE - Mini Mental State Exam 02/25/2017 12/24/2016  Orientation to time 4 5  Orientation to Place 4 5  Registration 3 3  Attention/ Calculation 3 3  Recall 1 2  Language- name 2 objects 2 2  Language- repeat 1 1  Language- follow 3 step command 3 3  Language- read & follow direction 1 1  Write a sentence 1 1  Copy design 0 0  Total score 23 26  animal naming 7   CRANIAL NERVES: CN II: Visual fields are full to confrontation. Fundoscopic exam is normal with sharp discs and no vascular changes. Pupils are round equal and briskly reactive to light. CN III, IV, VI: extraocular movement are normal. No ptosis. CN V: Facial sensation is intact to pinprick in all 3 divisions bilaterally. Corneal responses are intact.  CN VII: Face is symmetric with normal eye closure and smile. CN VIII: Hearing is normal to rubbing fingers CN IX, X: Palate elevates symmetrically. Phonation is normal. CN XI: Head turning and shoulder shrug are intact CN XII: Tongue is midline with normal movements and no atrophy.  MOTOR: There is no pronator drift of out-stretched arms. Muscle bulk and tone are normal. Muscle strength is normal.  REFLEXES: Reflexes are 2+ and symmetric at the biceps, triceps, knees, and ankles. Plantar responses are flexor.  SENSORY: Intact to light touch, pinprick, positional sensation and  vibratory sensation are intact in fingers and toes.  COORDINATION: Rapid alternating movements and fine finger movements are intact. There is no dysmetria on finger-to-nose and heel-knee-shin.    GAIT/STANCE: Posture is normal. Gait is steady with normal steps, base, arm swing, and turning. Heel and toe walking are normal. Tandem gait is normal.  Romberg is absent.   DIAGNOSTIC DATA (LABS, IMAGING, TESTING) - I reviewed patient records, labs, notes, testing and imaging myself where available.   ASSESSMENT AND PLAN  Zoee Heeney is a 66 y.o. female   Mild cognitive impairment Visual hallucinations  Most consistent with central nervous system degenerative disorder,  Laboratory evaluation to rule out treatable etiology  Starting Aricept   Marcial Pacas, M.D. Ph.D.  [facility] [address], [place], [place] [address] Ph: [phone] Fax: [phone]  CC: Reynold Bowen, MD

## 2017-02-25 NOTE — Telephone Encounter (Signed)
Need help with medication, agitation, visual hallucinations, trial vs seroquel

## 2017-02-26 ENCOUNTER — Telehealth: Payer: Self-pay | Admitting: Neurology

## 2017-02-26 MED ORDER — QUETIAPINE FUMARATE 25 MG PO TABS: 50.0000 mg | ORAL_TABLET | Freq: Every day | ORAL | 6 refills | Status: DC

## 2017-02-26 NOTE — Telephone Encounter (Signed)
I have called her husband,   I Rx seroquel 25mg , one tab titrating to 2 tabs every night.

## 2017-03-14 MED ORDER — RISPERIDONE 1 MG PO TABS: ORAL_TABLET | ORAL | 5 refills | Status: DC

## 2017-03-14 NOTE — Telephone Encounter (Addendum)
Pt called the clinic said she is taking 2 tabs at night but she sees and feels bugs all over her arms all the time. Please call her at [phone]

## 2017-03-14 NOTE — Telephone Encounter (Signed)
Spoke to patient - seroquel has not been helpful at all for her symptoms.  Dr. Krista Blue review her chart and has provided her with a prescription for risperidone 1mg , take 2 tablets at bedtime.  Pt agreeable to try new medication.  Rx sent to pharmacy.  She will call us back with any continued concerns.

## 2017-03-14 NOTE — Addendum Note (Signed)
Addended by: Noberto Retort C on: 03/14/2017 05:19 PM   Modules accepted: Orders

## 2017-04-02 ENCOUNTER — Telehealth: Payer: Self-pay | Admitting: Neurology

## 2017-04-02 ENCOUNTER — Encounter: Payer: Self-pay | Admitting: *Deleted

## 2017-04-02 NOTE — Telephone Encounter (Signed)
FYI: Spoke to patient's husband - states she is doing better since decreasing her risperidone dosage to 1mg  at bedtime.  He wanted to [place] know she is still having episodic confusion spells.  He gives the example of her waking up one morning and not recognizing him.  Says she remembered after having a conversation with her about them being married.  She was excluded from being a candidate for the study due to her taking methotrexate.  Also, risperidone is listed as a research excluded medication.  She has a pending appt on 05/02/17.

## 2017-04-02 NOTE — Telephone Encounter (Signed)
Pt husband(on DPR) calling re: the last medication that Dr Krista Blue put pt on(he is unsure of name)has helped with problem pt was having but has caused her some confusion.  Pt husband states as a result of the confusion they cut the dosage which has helped but there has still been a llittle confusion and he is asking for a call back about it

## 2017-04-18 DIAGNOSIS — I10 Essential (primary) hypertension: Secondary | ICD-10-CM | POA: Diagnosis not present

## 2017-04-18 DIAGNOSIS — E1129 Type 2 diabetes mellitus with other diabetic kidney complication: Secondary | ICD-10-CM | POA: Diagnosis not present

## 2017-04-18 DIAGNOSIS — Z6828 Body mass index (BMI) 28.0-28.9, adult: Secondary | ICD-10-CM | POA: Diagnosis not present

## 2017-04-18 DIAGNOSIS — N08 Glomerular disorders in diseases classified elsewhere: Secondary | ICD-10-CM | POA: Diagnosis not present

## 2017-04-20 DIAGNOSIS — Z23 Encounter for immunization: Secondary | ICD-10-CM | POA: Diagnosis not present

## 2017-04-29 ENCOUNTER — Other Ambulatory Visit: Payer: Self-pay | Admitting: *Deleted

## 2017-04-29 MED ORDER — DONEPEZIL HCL 10 MG PO TABS: 10.0000 mg | ORAL_TABLET | Freq: Every day | ORAL | 3 refills | Status: DC

## 2017-05-02 ENCOUNTER — Ambulatory Visit (INDEPENDENT_AMBULATORY_CARE_PROVIDER_SITE_OTHER): Payer: PPO | Admitting: Neurology

## 2017-05-02 ENCOUNTER — Encounter: Payer: Self-pay | Admitting: Neurology

## 2017-05-02 VITALS — BP 153/66 | HR 75 | Ht 64.0 in | Wt 179.0 lb

## 2017-05-02 DIAGNOSIS — F0391 Unspecified dementia with behavioral disturbance: Secondary | ICD-10-CM

## 2017-05-02 MED ORDER — MEMANTINE HCL 10 MG PO TABS: 10.0000 mg | ORAL_TABLET | Freq: Two times a day (BID) | ORAL | 11 refills | Status: DC

## 2017-05-02 MED ORDER — RISPERIDONE 0.5 MG PO TABS: ORAL_TABLET | ORAL | 11 refills | Status: DC

## 2017-05-02 NOTE — Progress Notes (Addendum)
PATIENT: Casey Jennings DOB: 04/10/1951  Chief Complaint  Patient presents with  . Follow-up    memory, pt is forgetting where she lives, that it is her house, that she has been married to her spouse.     HISTORICAL  Casey Jennings is a 66 years old right-handed female, accompanied by her husband, seen in refer by her primary care physician Dr. Forde Dandy, [facility] for evaluation of memory loss,  initial evaluation was on December 24 2016.  I reviewed and strength summarized a referral note, she had a past medical history of hypertension, hyperlipidemia, gastritis, anemia, colon polyps, had a history of bilateral carpal tunnel release surgery, bladder tacker, lumbar L4-5 transforaminal interbody fusion, parathyroidectomy in 2013, consistent with parathyroid adenoma, diabetes, insulin-dependent for few years.  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  She was noted to have gradually onset memory loss since 2017, especially after they went through distress of dealing with a premature grandson, she described difficulty remembering peoples name, name of the song, gradually getting worse, since April 2018, she also developed visual hallucination, describing transparent bugs crawling over her body, and her bed,  She sleeps long hours, with some sleep, sometimes missing her daily insulin dose and frequent blood glucose check, She denies gait abnormality, denies REM sleep disorder  I reviewed the laboratory evaluations in May 2018, CMP elevated glucose 274, creatinine 0.8 normal TSH   Have personally reviewed MRI of the brain without contrast in May 2018, generalized atrophy supratentorium small vessel disease no acute abnormalities, progression of atrophy compared to 2014  UPDATE August 6th 2018: Laboratory evaluation in June 2018, normal vitamin D, CPK, C-reactive  protein folic acid, RPR, vitamin B12, ANA, CBC showed hemoglobin of 10.5, A1c was elevated 7.5  UPDATE May 02 2017: She has less hallucination with risperidone, no longer has bug issues, she was noted to have increased confusion, think she does not lives at her house anymore,   She tolerated aricept 10mg  daily well.  REVIEW OF SYSTEMS: Full 14 system review of systems performed and notable only for As above  ALLERGIES: Allergies  Allergen Reactions  . Codeine     hallucinations    HOME MEDICATIONS: Current Outpatient Prescriptions  Medication Sig Dispense Refill  . aspirin 81 MG tablet Take 81 mg by mouth daily.    Marland Kitchen atorvastatin (LIPITOR) 80 MG tablet Take 80 mg by mouth at bedtime.     . Calcium Carbonate-Vitamin D (CALCIUM 600 + D PO) Take 600 mg by mouth daily.    . Choline Fenofibrate (TRILIPIX) 135 MG capsule Take 135 mg by mouth daily.    Marland Kitchen donepezil (ARICEPT) 10 MG tablet Take 1 tablet (10 mg total) by mouth at bedtime. 90 tablet 3  . doxycycline (VIBRAMYCIN) 100 MG capsule Take 100 mg by mouth 2 (two) times daily.    . Esomeprazole Magnesium (NEXIUM PO) Take by mouth.    . folic acid (FOLVITE) 1 MG tablet Take 1 mg by mouth daily.    . insulin detemir (LEVEMIR) 100 UNIT/ML injection Inject 40 Units into the skin at bedtime.    Marland Kitchen levothyroxine (SYNTHROID, LEVOTHROID) 100 MCG tablet Take 100 mcg by mouth daily before breakfast.    . linagliptin (TRADJENTA) 5 MG TABS tablet Take 5 mg by mouth at bedtime.     Marland Kitchen losartan-hydrochlorothiazide (HYZAAR) 100-12.5 MG tablet  Take 1 tablet by mouth daily.  2  . methotrexate (RHEUMATREX) 2.5 MG tablet Take 20 mg by mouth once a week. On Wednesdays.  Caution:Chemotherapy. Protect from light.    . metoCLOPramide (REGLAN) 10 MG tablet Take 10 mg by mouth 3 (three) times daily before meals.     . metoprolol succinate (TOPROL-XL) 50 MG 24 hr tablet Take 50 mg by mouth daily. Take with or immediately following a meal.    . Multiple Vitamin  (MULTIVITAMIN WITH MINERALS) TABS Take 1 tablet by mouth daily.    . nitroGLYCERIN (NITROSTAT) 0.4 MG SL tablet Place 0.4 mg under the tongue every 5 (five) minutes as needed for chest pain.    . Omega-3 Fatty Acids (FISH OIL PO) Take 1,200 mg by mouth daily.    . risperiDONE (RISPERDAL) 1 MG tablet Take 2 tablets at bedtime. (Patient taking differently: 1 mg. Take 2 tablets at bedtime.) 60 tablet 5  . Vitamin D, Ergocalciferol, (DRISDOL) 50000 UNITS CAPS capsule Take 50,000 Units by mouth. Tuesdays and Fridays.     No current facility-administered medications for this visit.     PAST MEDICAL HISTORY: Past Medical History:  Diagnosis Date  . Anemia   . Arthritis   . Chronic kidney disease    hx of uti   . Depression   . Diabetes mellitus without complication ([place])   . GERD (gastroesophageal reflux disease)   . Heart murmur   . Hypercalcemia   . Hyperlipidemia   . Hypertension   . Memory loss   . Osteopenia   . Osteoporosis   . Yeast infection    questinable per pt on 05/23/12- to let MD be aware of     PAST SURGICAL HISTORY: Past Surgical History:  Procedure Laterality Date  . BACK SURGERY    . BLADDER SUSPENSION    . CARPAL TUNNEL RELEASE    . CESAREAN SECTION    . CHOLECYSTECTOMY N/A 04/11/2014   Procedure: LAPAROSCOPIC CHOLECYSTECTOMY WITH INTRAOPERATIVE CHOLANGIOGRAM;  Surgeon: [name], MD;  Location: [facility];  Service: General;  Laterality: N/A;  . PARATHYROIDECTOMY  05/27/2012   Procedure: PARATHYROIDECTOMY;  Surgeon: Earnstine Regal, MD;  Location: [facility];  Service: General;  Laterality: N/A;  . right ankle surgery      due to fracture   . SPINE SURGERY    . TONSILLECTOMY      FAMILY HISTORY: Family History  Problem Relation Age of Onset  . Diabetes Mother   . Heart attack Father     SOCIAL HISTORY:  Social History   Social History  . Marital status: Married    Spouse name: N/A  . Number of children: 2  . Years of education: some college    Occupational History  . Retired    Social History Main Topics  . Smoking status: Former Smoker    Quit date: 05/07/1978  . Smokeless tobacco: Never Used  . Alcohol use Yes     Comment:  1- 2 times per year  . Drug use: No  . Sexual activity: Not on file   Other Topics Concern  . Not on file   Social History Narrative   Lives at home with her husband.   Right-handed.   2-3 glasses of diet Coke daily.     PHYSICAL EXAM   Vitals:   05/02/17 1158  BP: (!) 153/66  Pulse: 75  Weight: 179 lb (81.2 kg)  Height: 5\' 4"  (1.626 m)    Not recorded  Body mass index is 30.73 kg/m.  PHYSICAL EXAMNIATION:  Gen: NAD, conversant, well nourised, obese, well groomed                     Cardiovascular: Regular rate rhythm, no peripheral edema, warm, nontender. Eyes: Conjunctivae clear without exudates or hemorrhage Neck: Supple, no carotid bruits. Pulmonary: Clear to auscultation bilaterally   NEUROLOGICAL EXAM:  MENTAL STATUS: MMSE - Mini Mental State Exam 05/02/2017 02/25/2017 12/24/2016  Orientation to time 4 4 5   Orientation to Place 4 4 5   Registration 3 3 3   Attention/ Calculation 2 3 3   Recall 2 1 2   Language- name 2 objects 2 2 2   Language- repeat 1 1 1   Language- follow 3 step command 3 3 3   Language- read & follow direction 1 1 1   Write a sentence 1 1 1   Copy design 0 0 0  Total score 23 23 26       CRANIAL NERVES: CN II: Visual fields are full to confrontation. Fundoscopic exam is normal with sharp discs and no vascular changes. Pupils are round equal and briskly reactive to light. CN III, IV, VI: extraocular movement are normal. No ptosis. CN V: Facial sensation is intact to pinprick in all 3 divisions bilaterally. Corneal responses are intact.  CN VII: Face is symmetric with normal eye closure and smile. CN VIII: Hearing is normal to rubbing fingers CN IX, X: Palate elevates symmetrically. Phonation is normal. CN XI: Head turning and shoulder shrug are  intact CN XII: Tongue is midline with normal movements and no atrophy.  MOTOR: There is no pronator drift of out-stretched arms. Muscle bulk and tone are normal. Muscle strength is normal.  REFLEXES: Reflexes are 2+ and symmetric at the biceps, triceps, knees, and ankles. Plantar responses are flexor.  SENSORY: Intact to light touch, pinprick, positional sensation and vibratory sensation are intact in fingers and toes.  COORDINATION: Rapid alternating movements and fine finger movements are intact. There is no dysmetria on finger-to-nose and heel-knee-shin.    GAIT/STANCE: Posture is normal. Gait is steady with normal steps, base, arm swing, and turning. Heel and toe walking are normal. Tandem gait is normal.  Romberg is absent.   DIAGNOSTIC DATA (LABS, IMAGING, TESTING) - I reviewed patient records, labs, notes, testing and imaging myself where available.   ASSESSMENT AND PLAN  Casey Jennings is a 66 y.o. female   Mild cognitive impairment Visual hallucinations  Most consistent with central nervous system degenerative disorder, she has mild parkinsonian features, right-sided rigidity more than left, possible Lewy body dementia  Keep Aricept 10 mg daily, add on Namenda 10 mg twice a day  She had moderate improvement with less visual hallucination with Risperdal 1 mg every night, but still has delusional bothersome ideas, will add on Risperdal 0.5 milligrams every morning    Marcial Pacas, M.D. Ph.D.  [facility] [address] [place], [place] [address] Ph: [phone] Fax: [phone]  CC: Reynold Bowen, MD

## 2017-05-24 DIAGNOSIS — J029 Acute pharyngitis, unspecified: Secondary | ICD-10-CM | POA: Diagnosis not present

## 2017-05-24 DIAGNOSIS — J019 Acute sinusitis, unspecified: Secondary | ICD-10-CM | POA: Diagnosis not present

## 2017-05-24 DIAGNOSIS — Z6829 Body mass index (BMI) 29.0-29.9, adult: Secondary | ICD-10-CM | POA: Diagnosis not present

## 2017-05-24 DIAGNOSIS — R05 Cough: Secondary | ICD-10-CM | POA: Diagnosis not present

## 2017-05-24 DIAGNOSIS — L089 Local infection of the skin and subcutaneous tissue, unspecified: Secondary | ICD-10-CM | POA: Diagnosis not present

## 2017-06-05 DIAGNOSIS — R413 Other amnesia: Secondary | ICD-10-CM | POA: Diagnosis not present

## 2017-06-05 DIAGNOSIS — Z794 Long term (current) use of insulin: Secondary | ICD-10-CM | POA: Diagnosis not present

## 2017-06-05 DIAGNOSIS — I10 Essential (primary) hypertension: Secondary | ICD-10-CM | POA: Diagnosis not present

## 2017-06-05 DIAGNOSIS — N08 Glomerular disorders in diseases classified elsewhere: Secondary | ICD-10-CM | POA: Diagnosis not present

## 2017-06-05 DIAGNOSIS — E1129 Type 2 diabetes mellitus with other diabetic kidney complication: Secondary | ICD-10-CM | POA: Diagnosis not present

## 2017-06-10 ENCOUNTER — Telehealth: Payer: Self-pay | Admitting: Neurology

## 2017-06-10 NOTE — Telephone Encounter (Signed)
Spoke to patient's husband - says his wife has good days and bad days.  Dr. Krista Blue offered to add an additional 0.5mg  of risperidone in the morning if she was consistently irritated and difficult during the day.  Her husband felt they could manage on her current doses for now and did not wish to proceed with the increase.  Unfortunately, her symptoms are related to her progressing condition and he understands.

## 2017-07-30 ENCOUNTER — Other Ambulatory Visit: Payer: Self-pay | Admitting: *Deleted

## 2017-07-30 MED ORDER — RISPERIDONE 0.5 MG PO TABS: ORAL_TABLET | ORAL | 3 refills | Status: DC

## 2017-07-30 MED ORDER — MEMANTINE HCL 10 MG PO TABS: 10.0000 mg | ORAL_TABLET | Freq: Two times a day (BID) | ORAL | 3 refills | Status: DC

## 2017-08-05 DIAGNOSIS — H2512 Age-related nuclear cataract, left eye: Secondary | ICD-10-CM | POA: Diagnosis not present

## 2017-08-05 DIAGNOSIS — G4730 Sleep apnea, unspecified: Secondary | ICD-10-CM | POA: Diagnosis not present

## 2017-08-05 DIAGNOSIS — E113393 Type 2 diabetes mellitus with moderate nonproliferative diabetic retinopathy without macular edema, bilateral: Secondary | ICD-10-CM | POA: Diagnosis not present

## 2017-08-05 DIAGNOSIS — H2511 Age-related nuclear cataract, right eye: Secondary | ICD-10-CM | POA: Diagnosis not present

## 2017-09-02 ENCOUNTER — Telehealth: Payer: Self-pay | Admitting: Neurology

## 2017-09-02 DIAGNOSIS — R413 Other amnesia: Secondary | ICD-10-CM

## 2017-09-02 NOTE — Telephone Encounter (Signed)
Pt husband (on [place]) has called from his attorney's office [employer] [phone].  The 2 (pt husband and attorney) have requested a letter from Dr Krista Blue stating that re: pt's dementia she is no longer able to handle financial affairs re: estates or anything else YF:VCBSW matters.  Please call pt husband

## 2017-09-02 NOTE — Telephone Encounter (Signed)
Per Dr. Krista Blue, she would need to be referred for neuropsychiatric testing for further evaluation.  Spoke to patient's husband on HIPAA - he is agreeable to this referral.  Order placed in Epic.

## 2017-09-02 NOTE — Addendum Note (Signed)
Addended by: Desmond Lope on: 09/02/2017 03:24 PM   Modules accepted: Orders

## 2017-09-03 ENCOUNTER — Encounter: Payer: Self-pay | Admitting: Psychology

## 2017-09-03 DIAGNOSIS — Z6830 Body mass index (BMI) 30.0-30.9, adult: Secondary | ICD-10-CM | POA: Diagnosis not present

## 2017-09-03 DIAGNOSIS — I10 Essential (primary) hypertension: Secondary | ICD-10-CM | POA: Diagnosis not present

## 2017-09-03 DIAGNOSIS — E1129 Type 2 diabetes mellitus with other diabetic kidney complication: Secondary | ICD-10-CM | POA: Diagnosis not present

## 2017-09-03 DIAGNOSIS — N08 Glomerular disorders in diseases classified elsewhere: Secondary | ICD-10-CM | POA: Diagnosis not present

## 2017-09-03 DIAGNOSIS — Z794 Long term (current) use of insulin: Secondary | ICD-10-CM | POA: Diagnosis not present

## 2017-10-10 ENCOUNTER — Telehealth: Payer: Self-pay | Admitting: *Deleted

## 2017-10-10 NOTE — Telephone Encounter (Signed)
Patients spouse stopped by the office to request a letter.  Patient has on two occasions gone to the bank and taken out a substantial amount of money.  His lawyer has advised him to get a letter from [place] stating she has dementia.  He is not trying to obtain guardianship but needs to change the bank accounts.  Please call to discuss on his cell phone which is the number he provided.

## 2017-10-11 ENCOUNTER — Encounter: Payer: Self-pay | Admitting: *Deleted

## 2017-10-11 NOTE — Telephone Encounter (Signed)
Spoke to patient's husband.  The physician statement is ready for pick up and has been placed up front.  There is no fee for this type of request.  He was appreciative.

## 2017-10-11 NOTE — Telephone Encounter (Signed)
Spoke to Patient's husband He is fine with his Neuro psy apt. What he is needing is a letter stating and his lawyer and social security told him he needed this . Because he wife is taking large amounts of money out of the bank and they are not rich. What her husband is trying to do is set up another account for her social security only . This is called a repersentive Payee account.  Patient's husband is needed the letter to say that patient has been diagnosed with dementia so he can take to lawyer and social security and bank so she can only draw so much at a time out.  I relayed to Patient that this will be a $50.00 charge he relayed he will call back next week to pay.

## 2017-10-16 DIAGNOSIS — E11319 Type 2 diabetes mellitus with unspecified diabetic retinopathy without macular edema: Secondary | ICD-10-CM | POA: Diagnosis not present

## 2017-10-16 DIAGNOSIS — N08 Glomerular disorders in diseases classified elsewhere: Secondary | ICD-10-CM | POA: Diagnosis not present

## 2017-10-16 DIAGNOSIS — R413 Other amnesia: Secondary | ICD-10-CM | POA: Diagnosis not present

## 2017-10-16 DIAGNOSIS — I10 Essential (primary) hypertension: Secondary | ICD-10-CM | POA: Diagnosis not present

## 2017-10-16 DIAGNOSIS — E1129 Type 2 diabetes mellitus with other diabetic kidney complication: Secondary | ICD-10-CM | POA: Diagnosis not present

## 2017-10-16 DIAGNOSIS — E038 Other specified hypothyroidism: Secondary | ICD-10-CM | POA: Diagnosis not present

## 2017-10-16 DIAGNOSIS — E7849 Other hyperlipidemia: Secondary | ICD-10-CM | POA: Diagnosis not present

## 2017-10-16 DIAGNOSIS — R443 Hallucinations, unspecified: Secondary | ICD-10-CM | POA: Diagnosis not present

## 2017-10-16 DIAGNOSIS — K222 Esophageal obstruction: Secondary | ICD-10-CM | POA: Diagnosis not present

## 2017-10-16 DIAGNOSIS — E871 Hypo-osmolality and hyponatremia: Secondary | ICD-10-CM | POA: Diagnosis not present

## 2017-10-16 DIAGNOSIS — E559 Vitamin D deficiency, unspecified: Secondary | ICD-10-CM | POA: Diagnosis not present

## 2017-10-16 DIAGNOSIS — D649 Anemia, unspecified: Secondary | ICD-10-CM | POA: Diagnosis not present

## 2017-10-16 DIAGNOSIS — F339 Major depressive disorder, recurrent, unspecified: Secondary | ICD-10-CM | POA: Diagnosis not present

## 2017-10-16 DIAGNOSIS — Z794 Long term (current) use of insulin: Secondary | ICD-10-CM | POA: Diagnosis not present

## 2017-10-31 ENCOUNTER — Ambulatory Visit (INDEPENDENT_AMBULATORY_CARE_PROVIDER_SITE_OTHER): Payer: PPO | Admitting: Neurology

## 2017-10-31 ENCOUNTER — Encounter: Payer: Self-pay | Admitting: Neurology

## 2017-10-31 VITALS — BP 138/72 | HR 83 | Ht 64.0 in | Wt 190.0 lb

## 2017-10-31 DIAGNOSIS — F0391 Unspecified dementia with behavioral disturbance: Secondary | ICD-10-CM

## 2017-10-31 DIAGNOSIS — F03918 Unspecified dementia, unspecified severity, with other behavioral disturbance: Secondary | ICD-10-CM | POA: Insufficient documentation

## 2017-10-31 NOTE — Progress Notes (Signed)
PATIENT: Casey Jennings DOB: Aug 08, 1950  Chief Complaint  Patient presents with  . Dementia    MMSE 25/30 - 6 animals. She is here with her husband, Rickey.  She is still taking Aricept and Namenda.  Her memory is unchanged.  She has good and bad days.  She is taking Risperdal 0.5mg , one tablet BID and it has helped her hallucinations.     HISTORICAL  Casey Jennings is a 67 years old right-handed female, accompanied by her husband, seen in refer by her primary care physician Dr. Forde Dandy, Annie Main for evaluation of memory loss,  initial evaluation was on December 24 2016.  I reviewed and strength summarized a referral note, she had a past medical history of hypertension, hyperlipidemia, gastritis, anemia, colon polyps, had a history of bilateral carpal tunnel release surgery, bladder tacker, lumbar L4-5 transforaminal interbody fusion, parathyroidectomy in 2013, consistent with parathyroid adenoma, diabetes, insulin-dependent for few years.  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  She was noted to have gradually onset memory loss since 2017, especially after they went through distress of dealing with a premature grandson, she described difficulty remembering peoples name, name of the song, gradually getting worse, since April 2018, she also developed visual hallucination, describing transparent bugs crawling over her body, and her bed,  She sleeps long hours, with some sleep, sometimes missing her daily insulin dose and frequent blood glucose check, She denies gait abnormality, denies REM sleep disorder  I reviewed the laboratory evaluations in May 2018, CMP elevated glucose 274, creatinine 0.8 normal TSH   Have personally reviewed MRI of the brain without contrast in May 2018, generalized atrophy supratentorium small vessel disease no acute abnormalities,  progression of atrophy compared to 2014  UPDATE August 6th 2018: Laboratory evaluation in June 2018, normal vitamin D, CPK, C-reactive protein folic acid, RPR, vitamin B12, ANA, CBC showed hemoglobin of 10.5, A1c was elevated 7.5  UPDATE May 02 2017: She has less hallucination with risperidone, no longer has bug issues, she was noted to have increased confusion, think she does not lives at her house anymore,   She tolerated aricept 10mg  daily well.  UPDATE October 31 2017: She is accompanied by her husband at today's clinical visit, she is doing well with his current dose of Risperdal 0.5 mg 1 in the morning, 2 tablets at night, she no longer has visual hallucinations.  REVIEW OF SYSTEMS: Full 14 system review of systems performed and notable only for As above  ALLERGIES: Allergies  Allergen Reactions  . Codeine     hallucinations    HOME MEDICATIONS: Current Outpatient Medications  Medication Sig Dispense Refill  . aspirin 81 MG tablet Take 81 mg by mouth daily.    Marland Kitchen atorvastatin (LIPITOR) 80 MG tablet Take 80 mg by mouth at bedtime.     . Calcium Carbonate-Vitamin D (CALCIUM 600 + D PO) Take 600 mg by mouth daily.    . Choline Fenofibrate (TRILIPIX) 135 MG capsule Take 135 mg by mouth daily.    Marland Kitchen donepezil (ARICEPT) 10 MG tablet Take 1 tablet (10 mg total) by mouth at bedtime. 90 tablet 3  . doxycycline (VIBRAMYCIN) 100 MG capsule Take 100 mg by mouth 2 (two) times daily.    . Esomeprazole Magnesium (NEXIUM PO) Take by mouth.    . folic acid (FOLVITE) 1 MG tablet Take  1 mg by mouth daily.    . insulin detemir (LEVEMIR) 100 UNIT/ML injection Inject 40 Units into the skin at bedtime.    Marland Kitchen levothyroxine (SYNTHROID, LEVOTHROID) 100 MCG tablet Take 100 mcg by mouth daily before breakfast.    . linagliptin (TRADJENTA) 5 MG TABS tablet Take 5 mg by mouth at bedtime.     Marland Kitchen losartan-hydrochlorothiazide (HYZAAR) 100-12.5 MG tablet Take 1 tablet by mouth daily.  2  . memantine (NAMENDA) 10 MG  tablet Take 1 tablet (10 mg total) by mouth 2 (two) times daily. 180 tablet 3  . methotrexate (RHEUMATREX) 2.5 MG tablet Take 20 mg by mouth once a week. On Wednesdays.  Caution:Chemotherapy. Protect from light.    . metoCLOPramide (REGLAN) 10 MG tablet Take 10 mg by mouth 3 (three) times daily before meals.     . metoprolol succinate (TOPROL-XL) 50 MG 24 hr tablet Take 50 mg by mouth daily. Take with or immediately following a meal.    . Multiple Vitamin (MULTIVITAMIN WITH MINERALS) TABS Take 1 tablet by mouth daily.    . nitroGLYCERIN (NITROSTAT) 0.4 MG SL tablet Place 0.4 mg under the tongue every 5 (five) minutes as needed for chest pain.    . Omega-3 Fatty Acids (FISH OIL PO) Take 1,200 mg by mouth daily.    . risperiDONE (RISPERDAL) 0.5 MG tablet One in the morning, 2 tablets at night 270 tablet 3  . Vitamin D, Ergocalciferol, (DRISDOL) 50000 UNITS CAPS capsule Take 50,000 Units by mouth. Tuesdays and Fridays.     No current facility-administered medications for this visit.     PAST MEDICAL HISTORY: Past Medical History:  Diagnosis Date  . Anemia   . Arthritis   . Chronic kidney disease    hx of uti   . Depression   . Diabetes mellitus without complication ([place])   . GERD (gastroesophageal reflux disease)   . Heart murmur   . Hypercalcemia   . Hyperlipidemia   . Hypertension   . Memory loss   . Osteopenia   . Osteoporosis   . Yeast infection    questinable per pt on 05/23/12- to let MD be aware of     PAST SURGICAL HISTORY: Past Surgical History:  Procedure Laterality Date  . BACK SURGERY    . BLADDER SUSPENSION    . CARPAL TUNNEL RELEASE    . CESAREAN SECTION    . CHOLECYSTECTOMY N/A 04/11/2014   Procedure: LAPAROSCOPIC CHOLECYSTECTOMY WITH INTRAOPERATIVE CHOLANGIOGRAM;  Surgeon: [name], MD;  Location: [facility];  Service: General;  Laterality: N/A;  . PARATHYROIDECTOMY  05/27/2012   Procedure: PARATHYROIDECTOMY;  Surgeon: Earnstine Regal, MD;  Location: [facility];   Service: General;  Laterality: N/A;  . right ankle surgery      due to fracture   . SPINE SURGERY    . TONSILLECTOMY      FAMILY HISTORY: Family History  Problem Relation Age of Onset  . Diabetes Mother   . Heart attack Father     SOCIAL HISTORY:  Social History   Socioeconomic History  . Marital status: Married    Spouse name: Not on file  . Number of children: 2  . Years of education: some college  . Highest education level: Not on file  Occupational History  . Occupation: Retired  Scientific laboratory technician  . Financial resource strain: Not on file  . Food insecurity:    Worry: Not on file    Inability: Not on file  . Transportation  needs:    Medical: Not on file    Non-medical: Not on file  Tobacco Use  . Smoking status: Former Smoker    Last attempt to quit: 05/07/1978    Years since quitting: 39.5  . Smokeless tobacco: Never Used  Substance and Sexual Activity  . Alcohol use: Yes    Comment:  1- 2 times per year  . Drug use: No  . Sexual activity: Not on file  Lifestyle  . Physical activity:    Days per week: Not on file    Minutes per session: Not on file  . Stress: Not on file  Relationships  . Social connections:    Talks on phone: Not on file    Gets together: Not on file    Attends religious service: Not on file    Active member of club or organization: Not on file    Attends meetings of clubs or organizations: Not on file    Relationship status: Not on file  . Intimate partner violence:    Fear of current or ex partner: Not on file    Emotionally abused: Not on file    Physically abused: Not on file    Forced sexual activity: Not on file  Other Topics Concern  . Not on file  Social History Narrative   Lives at home with her husband.   Right-handed.   2-3 glasses of diet Coke daily.     PHYSICAL EXAM   Vitals:   10/31/17 1334  BP: 138/72  Pulse: 83  Weight: 190 lb (86.2 kg)  Height: 5\' 4"  (1.626 m)    Not recorded      Body mass index  is 32.61 kg/m.  PHYSICAL EXAMNIATION:  Gen: NAD, conversant, well nourised, obese, well groomed                     Cardiovascular: Regular rate rhythm, no peripheral edema, warm, nontender. Eyes: Conjunctivae clear without exudates or hemorrhage Neck: Supple, no carotid bruits. Pulmonary: Clear to auscultation bilaterally   NEUROLOGICAL EXAM:  MENTAL STATUS: MMSE - Mini Mental State Exam 10/31/2017 05/02/2017 02/25/2017  Orientation to time 4 4 4   Orientation to Place 5 4 4   Registration 3 3 3   Attention/ Calculation 5 2 3   Recall 0 2 1  Language- name 2 objects 2 2 2   Language- repeat 1 1 1   Language- follow 3 step command 3 3 3   Language- read & follow direction 1 1 1   Write a sentence 1 1 1   Copy design 0 0 0  Total score 25 23 23   Animal naming 6   CRANIAL NERVES: CN II: Visual fields are full to confrontation. Fundoscopic exam is normal with sharp discs and no vascular changes. Pupils are round equal and briskly reactive to light. CN III, IV, VI: extraocular movement are normal. No ptosis. CN V: Facial sensation is intact to pinprick in all 3 divisions bilaterally. Corneal responses are intact.  CN VII: Face is symmetric with normal eye closure and smile. CN VIII: Hearing is normal to rubbing fingers CN IX, X: Palate elevates symmetrically. Phonation is normal. CN XI: Head turning and shoulder shrug are intact CN XII: Tongue is midline with normal movements and no atrophy.  MOTOR: There is no pronator drift of out-stretched arms. Muscle bulk and tone are normal. Muscle strength is normal.  REFLEXES: Reflexes are 2+ and symmetric at the biceps, triceps, knees, and ankles. Plantar responses are flexor.  SENSORY:  Intact to light touch, pinprick, positional sensation and vibratory sensation are intact in fingers and toes.  COORDINATION: Rapid alternating movements and fine finger movements are intact. There is no dysmetria on finger-to-nose and heel-knee-shin.     GAIT/STANCE: Posture is normal. Gait is steady with normal steps, base, arm swing, and turning. Heel and toe walking are normal. Tandem gait is normal.  Romberg is absent.   DIAGNOSTIC DATA (LABS, IMAGING, TESTING) - I reviewed patient records, labs, notes, testing and imaging myself where available.   ASSESSMENT AND PLAN  Sharunda Salmon is a 67 y.o. female   Mild cognitive impairment Visual hallucinations  Most consistent with central nervous system degenerative disorder, she has mild parkinsonian features, right-sided rigidity more than left, possible Lewy body dementia  Keep Aricept 10 mg daily and Namenda 10 mg twice a day  She had moderate improvement with less visual hallucination with Risperdal 1 mg every night, but still has delusional bothersome ideas, did in the morning better with adding on Risperdal 0.5 milligrams every morning    Marcial Pacas, M.D. Ph.D.  [facility] [address] [place], [place] [address] Ph: [phone] Fax: [phone]  CC: Reynold Bowen, MD

## 2017-11-14 DIAGNOSIS — N08 Glomerular disorders in diseases classified elsewhere: Secondary | ICD-10-CM | POA: Diagnosis not present

## 2017-12-04 DIAGNOSIS — Z6831 Body mass index (BMI) 31.0-31.9, adult: Secondary | ICD-10-CM | POA: Diagnosis not present

## 2017-12-04 DIAGNOSIS — I10 Essential (primary) hypertension: Secondary | ICD-10-CM | POA: Diagnosis not present

## 2017-12-04 DIAGNOSIS — Z794 Long term (current) use of insulin: Secondary | ICD-10-CM | POA: Diagnosis not present

## 2017-12-04 DIAGNOSIS — K3184 Gastroparesis: Secondary | ICD-10-CM | POA: Diagnosis not present

## 2017-12-04 DIAGNOSIS — E1129 Type 2 diabetes mellitus with other diabetic kidney complication: Secondary | ICD-10-CM | POA: Diagnosis not present

## 2017-12-04 DIAGNOSIS — N184 Chronic kidney disease, stage 4 (severe): Secondary | ICD-10-CM | POA: Diagnosis not present

## 2017-12-05 DIAGNOSIS — K5730 Diverticulosis of large intestine without perforation or abscess without bleeding: Secondary | ICD-10-CM | POA: Diagnosis not present

## 2017-12-05 DIAGNOSIS — K635 Polyp of colon: Secondary | ICD-10-CM | POA: Diagnosis not present

## 2017-12-05 DIAGNOSIS — D126 Benign neoplasm of colon, unspecified: Secondary | ICD-10-CM | POA: Diagnosis not present

## 2017-12-05 DIAGNOSIS — D123 Benign neoplasm of transverse colon: Secondary | ICD-10-CM | POA: Diagnosis not present

## 2017-12-05 DIAGNOSIS — Z86010 Personal history of colonic polyps: Secondary | ICD-10-CM | POA: Diagnosis not present

## 2017-12-05 DIAGNOSIS — K5790 Diverticulosis of intestine, part unspecified, without perforation or abscess without bleeding: Secondary | ICD-10-CM | POA: Diagnosis not present

## 2017-12-05 DIAGNOSIS — Z1211 Encounter for screening for malignant neoplasm of colon: Secondary | ICD-10-CM | POA: Diagnosis not present

## 2017-12-19 ENCOUNTER — Encounter: Payer: PPO | Admitting: Psychology

## 2018-01-02 DIAGNOSIS — M069 Rheumatoid arthritis, unspecified: Secondary | ICD-10-CM | POA: Diagnosis not present

## 2018-01-02 DIAGNOSIS — F0390 Unspecified dementia without behavioral disturbance: Secondary | ICD-10-CM | POA: Diagnosis not present

## 2018-01-02 DIAGNOSIS — E1122 Type 2 diabetes mellitus with diabetic chronic kidney disease: Secondary | ICD-10-CM | POA: Diagnosis not present

## 2018-01-02 DIAGNOSIS — Z6830 Body mass index (BMI) 30.0-30.9, adult: Secondary | ICD-10-CM | POA: Diagnosis not present

## 2018-01-02 DIAGNOSIS — N183 Chronic kidney disease, stage 3 (moderate): Secondary | ICD-10-CM | POA: Diagnosis not present

## 2018-01-02 DIAGNOSIS — D649 Anemia, unspecified: Secondary | ICD-10-CM | POA: Diagnosis not present

## 2018-01-14 ENCOUNTER — Encounter: Payer: PPO | Admitting: Psychology

## 2018-01-31 DIAGNOSIS — N183 Chronic kidney disease, stage 3 (moderate): Secondary | ICD-10-CM | POA: Diagnosis not present

## 2018-02-12 DIAGNOSIS — E871 Hypo-osmolality and hyponatremia: Secondary | ICD-10-CM | POA: Diagnosis not present

## 2018-02-12 DIAGNOSIS — E038 Other specified hypothyroidism: Secondary | ICD-10-CM | POA: Diagnosis not present

## 2018-02-12 DIAGNOSIS — F0390 Unspecified dementia without behavioral disturbance: Secondary | ICD-10-CM | POA: Diagnosis not present

## 2018-02-12 DIAGNOSIS — M8580 Other specified disorders of bone density and structure, unspecified site: Secondary | ICD-10-CM | POA: Diagnosis not present

## 2018-02-12 DIAGNOSIS — Z6830 Body mass index (BMI) 30.0-30.9, adult: Secondary | ICD-10-CM | POA: Diagnosis not present

## 2018-02-12 DIAGNOSIS — I10 Essential (primary) hypertension: Secondary | ICD-10-CM | POA: Diagnosis not present

## 2018-02-12 DIAGNOSIS — K3184 Gastroparesis: Secondary | ICD-10-CM | POA: Diagnosis not present

## 2018-02-12 DIAGNOSIS — E11319 Type 2 diabetes mellitus with unspecified diabetic retinopathy without macular edema: Secondary | ICD-10-CM | POA: Diagnosis not present

## 2018-02-12 DIAGNOSIS — M069 Rheumatoid arthritis, unspecified: Secondary | ICD-10-CM | POA: Diagnosis not present

## 2018-02-12 DIAGNOSIS — N184 Chronic kidney disease, stage 4 (severe): Secondary | ICD-10-CM | POA: Diagnosis not present

## 2018-02-12 DIAGNOSIS — E1129 Type 2 diabetes mellitus with other diabetic kidney complication: Secondary | ICD-10-CM | POA: Diagnosis not present

## 2018-02-12 DIAGNOSIS — Z794 Long term (current) use of insulin: Secondary | ICD-10-CM | POA: Diagnosis not present

## 2018-04-01 DIAGNOSIS — Z23 Encounter for immunization: Secondary | ICD-10-CM | POA: Diagnosis not present

## 2018-04-01 DIAGNOSIS — Z794 Long term (current) use of insulin: Secondary | ICD-10-CM | POA: Diagnosis not present

## 2018-04-01 DIAGNOSIS — E1129 Type 2 diabetes mellitus with other diabetic kidney complication: Secondary | ICD-10-CM | POA: Diagnosis not present

## 2018-04-01 DIAGNOSIS — N184 Chronic kidney disease, stage 4 (severe): Secondary | ICD-10-CM | POA: Diagnosis not present

## 2018-04-01 DIAGNOSIS — Z6829 Body mass index (BMI) 29.0-29.9, adult: Secondary | ICD-10-CM | POA: Diagnosis not present

## 2018-04-01 DIAGNOSIS — I10 Essential (primary) hypertension: Secondary | ICD-10-CM | POA: Diagnosis not present

## 2018-04-03 ENCOUNTER — Encounter (HOSPITAL_COMMUNITY): Payer: Self-pay | Admitting: Emergency Medicine

## 2018-04-03 ENCOUNTER — Emergency Department (HOSPITAL_COMMUNITY)
Admission: EM | Admit: 2018-04-03 | Discharge: 2018-04-04 | Disposition: A | Discharge status: Home / Self Care | Payer: PPO | Attending: Emergency Medicine | Admitting: Emergency Medicine

## 2018-04-03 DIAGNOSIS — R3911 Hesitancy of micturition: Secondary | ICD-10-CM | POA: Diagnosis not present

## 2018-04-03 DIAGNOSIS — R350 Frequency of micturition: Secondary | ICD-10-CM | POA: Insufficient documentation

## 2018-04-03 DIAGNOSIS — Z5321 Procedure and treatment not carried out due to patient leaving prior to being seen by health care provider: Secondary | ICD-10-CM | POA: Insufficient documentation

## 2018-04-03 DIAGNOSIS — R3915 Urgency of urination: Secondary | ICD-10-CM | POA: Diagnosis not present

## 2018-04-03 LAB — BASIC METABOLIC PANEL
ANION GAP: 13 (ref 5–15)
BUN: 24 mg/dL — ABNORMAL HIGH (ref 8–23)
CALCIUM: 9.7 mg/dL (ref 8.9–10.3)
CO2: 25 mmol/L (ref 22–32)
CREATININE: 2.39 mg/dL — AB (ref 0.44–1.00)
Chloride: 96 mmol/L — ABNORMAL LOW (ref 98–111)
GFR, EST AFRICAN AMERICAN: 23 mL/min — AB (ref >60)
GFR, EST NON AFRICAN AMERICAN: 20 mL/min — AB (ref >60)
Glucose, Bld: 206 mg/dL — ABNORMAL HIGH (ref 70–99)
Potassium: 3.9 mmol/L (ref 3.5–5.1)
SODIUM: 134 mmol/L — AB (ref 135–145)

## 2018-04-03 LAB — CBG MONITORING, ED: GLUCOSE-CAPILLARY: 197 mg/dL — AB (ref 70–99)

## 2018-04-03 LAB — CBC
HCT: 33.4 % — ABNORMAL LOW (ref 36.0–46.0)
Hemoglobin: 10.9 g/dL — ABNORMAL LOW (ref 12.0–15.0)
MCH: 28.9 pg (ref 26.0–34.0)
MCHC: 32.6 g/dL (ref 30.0–36.0)
MCV: 88.6 fL (ref 78.0–100.0)
PLATELETS: 398 10*3/uL (ref 150–400)
RBC: 3.77 MIL/uL — AB (ref 3.87–5.11)
RDW: 12.6 % (ref 11.5–15.5)
WBC: 6.4 10*3/uL (ref 4.0–10.5)

## 2018-04-03 NOTE — ED Notes (Signed)
Pt's husband came to nurse first desk asking about wait time. This tech walked to the patient to inform her of the wait time and her place in the waiting room. She then stated that she wanted to leave. Charge was notified and tried to get patient to stay and wait to be medically screened, but she did not want to wait and chose to leave.

## 2018-04-03 NOTE — ED Triage Notes (Signed)
Pt BIB family, c/o urinary frequency. Pt having trouble finding her words in triage, family reports this is her baseline. Dr. Alvino Chapel aware, no CT order at this time.

## 2018-04-04 ENCOUNTER — Encounter (HOSPITAL_COMMUNITY): Payer: Self-pay

## 2018-04-04 ENCOUNTER — Other Ambulatory Visit: Payer: Self-pay

## 2018-04-04 ENCOUNTER — Emergency Department (HOSPITAL_COMMUNITY)
Admission: EM | Admit: 2018-04-04 | Discharge: 2018-04-04 | Disposition: A | Discharge status: Home / Self Care | Payer: PPO | Source: Home / Self Care | Attending: Emergency Medicine | Admitting: Emergency Medicine

## 2018-04-04 DIAGNOSIS — G309 Alzheimer's disease, unspecified: Secondary | ICD-10-CM

## 2018-04-04 DIAGNOSIS — E1122 Type 2 diabetes mellitus with diabetic chronic kidney disease: Secondary | ICD-10-CM | POA: Insufficient documentation

## 2018-04-04 DIAGNOSIS — I129 Hypertensive chronic kidney disease with stage 1 through stage 4 chronic kidney disease, or unspecified chronic kidney disease: Secondary | ICD-10-CM | POA: Insufficient documentation

## 2018-04-04 DIAGNOSIS — Z794 Long term (current) use of insulin: Secondary | ICD-10-CM | POA: Insufficient documentation

## 2018-04-04 DIAGNOSIS — Z87891 Personal history of nicotine dependence: Secondary | ICD-10-CM

## 2018-04-04 DIAGNOSIS — F0280 Dementia in other diseases classified elsewhere without behavioral disturbance: Secondary | ICD-10-CM

## 2018-04-04 DIAGNOSIS — Z7982 Long term (current) use of aspirin: Secondary | ICD-10-CM

## 2018-04-04 DIAGNOSIS — N189 Chronic kidney disease, unspecified: Secondary | ICD-10-CM

## 2018-04-04 DIAGNOSIS — R3911 Hesitancy of micturition: Secondary | ICD-10-CM | POA: Insufficient documentation

## 2018-04-04 DIAGNOSIS — R3915 Urgency of urination: Secondary | ICD-10-CM

## 2018-04-04 DIAGNOSIS — Z79899 Other long term (current) drug therapy: Secondary | ICD-10-CM

## 2018-04-04 DIAGNOSIS — Z9049 Acquired absence of other specified parts of digestive tract: Secondary | ICD-10-CM | POA: Insufficient documentation

## 2018-04-04 LAB — URINALYSIS, ROUTINE W REFLEX MICROSCOPIC
BACTERIA UA: NONE SEEN
Bilirubin Urine: NEGATIVE
Glucose, UA: NEGATIVE mg/dL
HGB URINE DIPSTICK: NEGATIVE
Ketones, ur: NEGATIVE mg/dL
NITRITE: NEGATIVE
PROTEIN: NEGATIVE mg/dL
SPECIFIC GRAVITY, URINE: 1.017 (ref 1.005–1.030)
pH: 6 (ref 5.0–8.0)

## 2018-04-04 MED ORDER — PHENAZOPYRIDINE HCL 200 MG PO TABS: 200.0000 mg | ORAL_TABLET | Freq: Three times a day (TID) | ORAL | 0 refills | Status: DC

## 2018-04-04 NOTE — ED Provider Notes (Signed)
[facility] DEPT Provider Note   CSN: [id number] Arrival date & time: 04/04/18  1547     History   Chief Complaint Chief Complaint  Patient presents with  . Dysuria    HPI Casey Jennings is a 67 y.o. female who presents with urinary urgency and retention. PMH significant for dementia, CKD, DM, GERD, HTN. Her husband is at bedside. The patient states that for the past week she has had difficulty urinating at times and then other time urgency to the point where she will have incontinence. She denies any fever, chills, abdominal pain, flank pain, N/V, dysuria, hematuria. She has had UTIs in the past and it does not feel similar. They went to [facility] last night but LWBS due to wait times. Her BMP was remarkable for SCr of 2.39 however there is no recent value to compare. She has been referred to a kidney specialist several months ago and her kidney function is being monitored. Past surgical hx significant for cholecystectomy, hysterectomy, bladder suspension surgery.  HPI  Past Medical History:  Diagnosis Date  . Anemia   . Arthritis   . Chronic kidney disease    hx of uti   . Depression   . Diabetes mellitus without complication ([place])   . GERD (gastroesophageal reflux disease)   . Heart murmur   . Hypercalcemia   . Hyperlipidemia   . Hypertension   . Memory loss   . Osteopenia   . Osteoporosis   . Yeast infection    questinable per pt on 05/23/12- to let MD be aware of     Patient Active Problem List   Diagnosis Date Noted  . Dementia with behavioral disturbance 10/31/2017  . Alzheimer's dementia with behavioral disturbance 02/25/2017  . Memory loss 12/24/2016  . Visual hallucination 12/24/2016  . Diabetes mellitus type 2 with complications ([place]) 84/69/6295  . Gastroparesis due to DM ([place]) 04/11/2014  . Rheumatoid arthritis ([place]) 04/11/2014  . Cholecystitis 04/10/2014  . Hyperparathyroidism, primary ([place]) 05/12/2012    Past Surgical  History:  Procedure Laterality Date  . BACK SURGERY    . BLADDER SUSPENSION    . CARPAL TUNNEL RELEASE    . CESAREAN SECTION    . CHOLECYSTECTOMY N/A 04/11/2014   Procedure: LAPAROSCOPIC CHOLECYSTECTOMY WITH INTRAOPERATIVE CHOLANGIOGRAM;  Surgeon: [name], MD;  Location: [facility];  Service: General;  Laterality: N/A;  . PARATHYROIDECTOMY  05/27/2012   Procedure: PARATHYROIDECTOMY;  Surgeon: Earnstine Regal, MD;  Location: [facility];  Service: General;  Laterality: N/A;  . right ankle surgery      due to fracture   . SPINE SURGERY    . TONSILLECTOMY       OB History   None      Home Medications    Prior to Admission medications   Medication Sig Start Date End Date Taking? Authorizing Provider  aspirin 81 MG tablet Take 81 mg by mouth daily.    [provider]  atorvastatin (LIPITOR) 80 MG tablet Take 80 mg by mouth at bedtime.     [provider]  Calcium Carbonate-Vitamin D (CALCIUM 600 + D PO) Take 600 mg by mouth daily.    [provider]  Choline Fenofibrate (TRILIPIX) 135 MG capsule Take 135 mg by mouth daily.    [provider]  donepezil (ARICEPT) 10 MG tablet Take 1 tablet (10 mg total) by mouth at bedtime. 04/29/17   Marcial Pacas, MD  doxycycline (VIBRAMYCIN) 100 MG capsule Take  100 mg by mouth 2 (two) times daily.    [provider]  Esomeprazole Magnesium (NEXIUM PO) Take by mouth.    [provider]  folic acid (FOLVITE) 1 MG tablet Take 1 mg by mouth daily.    [provider]  insulin detemir (LEVEMIR) 100 UNIT/ML injection Inject 40 Units into the skin at bedtime.    [provider]  levothyroxine (SYNTHROID, LEVOTHROID) 100 MCG tablet Take 100 mcg by mouth daily before breakfast.    [provider]  linagliptin (TRADJENTA) 5 MG TABS tablet Take 5 mg by mouth at bedtime.     [provider]  losartan-hydrochlorothiazide (HYZAAR) 100-12.5 MG tablet Take 1 tablet by mouth daily. 10/28/16    [provider]  memantine (NAMENDA) 10 MG tablet Take 1 tablet (10 mg total) by mouth 2 (two) times daily. 07/30/17   Marcial Pacas, MD  methotrexate (RHEUMATREX) 2.5 MG tablet Take 20 mg by mouth once a week. On Wednesdays.  Caution:Chemotherapy. Protect from light.    [provider]  metoCLOPramide (REGLAN) 10 MG tablet Take 10 mg by mouth 3 (three) times daily before meals.     [provider]  metoprolol succinate (TOPROL-XL) 50 MG 24 hr tablet Take 50 mg by mouth daily. Take with or immediately following a meal.    [provider]  Multiple Vitamin (MULTIVITAMIN WITH MINERALS) TABS Take 1 tablet by mouth daily.    [provider]  nitroGLYCERIN (NITROSTAT) 0.4 MG SL tablet Place 0.4 mg under the tongue every 5 (five) minutes as needed for chest pain.    [provider]  Omega-3 Fatty Acids (FISH OIL PO) Take 1,200 mg by mouth daily.    [provider]  risperiDONE (RISPERDAL) 0.5 MG tablet One in the morning, 2 tablets at night 07/30/17   Marcial Pacas, MD  Vitamin D, Ergocalciferol, (DRISDOL) 50000 UNITS CAPS capsule Take 50,000 Units by mouth. Tuesdays and Fridays.    [provider]    Family History Family History  Problem Relation Age of Onset  . Diabetes Mother   . Heart attack Father     Social History Social History   Tobacco Use  . Smoking status: Former Smoker    Last attempt to quit: 05/07/1978    Years since quitting: 39.9  . Smokeless tobacco: Never Used  Substance Use Topics  . Alcohol use: Yes    Comment:  1- 2 times per year  . Drug use: No     Allergies   Codeine   Review of Systems Review of Systems  Constitutional: Negative for chills and fever.  Respiratory: Negative for shortness of breath.   Cardiovascular: Negative for chest pain.  Gastrointestinal: Negative for abdominal pain, nausea and vomiting.  Genitourinary: Positive for difficulty urinating. Negative for dysuria, flank pain  and hematuria.  Psychiatric/Behavioral: Positive for confusion.  All other systems reviewed and are negative.    Physical Exam Updated Vital Signs BP 112/75 (BP Location: Left Arm)   Pulse 73   Resp 16   SpO2 98%   Physical Exam  Constitutional: She is oriented to person, place, and time. She appears well-developed and well-nourished. No distress.  Calm and cooperative. Flat affect  HENT:  Head: Normocephalic and atraumatic.  Eyes: Pupils are equal, round, and reactive to light. Conjunctivae are normal. Right eye exhibits no discharge. Left eye exhibits no discharge. No scleral icterus.  Neck: Normal range of motion.  Cardiovascular: Normal rate and regular rhythm.  Pulmonary/Chest: Effort normal and breath sounds normal. No respiratory distress.  Abdominal: Soft. Bowel sounds are normal. She exhibits no distension. There is no tenderness.  Neurological: She is alert and oriented to person, place, and time.  Skin: Skin is warm and dry.  Psychiatric: She has a normal mood and affect. Her behavior is normal.  Nursing note and vitals reviewed.    ED Treatments / Results  Labs (all labs ordered are listed, but only abnormal results are displayed) Labs Reviewed  URINALYSIS, ROUTINE W REFLEX MICROSCOPIC - Abnormal; Notable for the following components:      Result Value   Leukocytes, UA TRACE (*)    All other components within normal limits  URINE CULTURE    EKG None  Radiology No results found.  Procedures Procedures (including critical care time)  Medications Ordered in ED Medications - No data to display   Initial Impression / Assessment and Plan / ED Course  I have reviewed the triage vital signs and the nursing notes.  Pertinent labs & imaging results that were available during my care of the patient were reviewed by me and considered in my medical decision making (see chart for details).  67 year old female presents with urinary hesitancy and urgency for the  past week. Her vitals are normal. Abdomen is soft, non-tender. She is not vomiting. Labs from yesterday were reviewed. Her SCr is elevated from prior value (which was in 2015) but they have seen nephrology so likely this is not an acute finding. UA is not consistent with infection. Culture was sent. Discussed with attending Dr. Venora Maples. Advised to try pyridium and f/u with urology. Return precautions given.  Final Clinical Impressions(s) / ED Diagnoses   Final diagnoses:  Urinary hesitancy  Urinary urgency    ED Discharge Orders    None       Recardo Evangelist, PA-C 04/05/18 Michail Sermon, MD 04/05/18 [phone]

## 2018-04-04 NOTE — ED Triage Notes (Signed)
Pt reports that she has had been experiencing dysuria x4-5 days. Reports that sometimes she cannot control her urination and other times she feels like she cant go. Denies hematuria. Pt struggling finding words in triage, family reports that this is normal for her. She denies pain, nausea, vomiting, or diarrhea.

## 2018-04-04 NOTE — ED Notes (Signed)
Bladder scan volume: 0 mL. 

## 2018-04-04 NOTE — Discharge Instructions (Addendum)
Try Pyridium to help with urinary symptoms - this makes the urine turn orange and can stain clothes so please wear a pad or liner to protect your clothing Please follow up with Urology for further testing

## 2018-04-06 LAB — URINE CULTURE

## 2018-04-10 DIAGNOSIS — N952 Postmenopausal atrophic vaginitis: Secondary | ICD-10-CM | POA: Diagnosis not present

## 2018-04-10 DIAGNOSIS — N3941 Urge incontinence: Secondary | ICD-10-CM | POA: Diagnosis not present

## 2018-04-10 DIAGNOSIS — R350 Frequency of micturition: Secondary | ICD-10-CM | POA: Diagnosis not present

## 2018-04-23 DIAGNOSIS — H2513 Age-related nuclear cataract, bilateral: Secondary | ICD-10-CM | POA: Diagnosis not present

## 2018-04-23 DIAGNOSIS — H524 Presbyopia: Secondary | ICD-10-CM | POA: Diagnosis not present

## 2018-04-23 DIAGNOSIS — E113293 Type 2 diabetes mellitus with mild nonproliferative diabetic retinopathy without macular edema, bilateral: Secondary | ICD-10-CM | POA: Diagnosis not present

## 2018-04-23 DIAGNOSIS — H5203 Hypermetropia, bilateral: Secondary | ICD-10-CM | POA: Diagnosis not present

## 2018-04-29 DIAGNOSIS — R310 Gross hematuria: Secondary | ICD-10-CM | POA: Diagnosis not present

## 2018-04-29 DIAGNOSIS — R350 Frequency of micturition: Secondary | ICD-10-CM | POA: Diagnosis not present

## 2018-04-29 DIAGNOSIS — N952 Postmenopausal atrophic vaginitis: Secondary | ICD-10-CM | POA: Diagnosis not present

## 2018-04-29 DIAGNOSIS — N3941 Urge incontinence: Secondary | ICD-10-CM | POA: Diagnosis not present

## 2018-05-13 ENCOUNTER — Inpatient Hospital Stay (HOSPITAL_COMMUNITY)
Admission: EM | Admit: 2018-05-13 | Discharge: 2018-05-17 | DRG: 690 | Disposition: A | Discharge status: Home / Self Care | Payer: PPO | Attending: Internal Medicine | Admitting: Internal Medicine

## 2018-05-13 ENCOUNTER — Encounter (HOSPITAL_COMMUNITY): Payer: Self-pay | Admitting: Emergency Medicine

## 2018-05-13 DIAGNOSIS — Z79899 Other long term (current) drug therapy: Secondary | ICD-10-CM

## 2018-05-13 DIAGNOSIS — F0281 Dementia in other diseases classified elsewhere with behavioral disturbance: Secondary | ICD-10-CM | POA: Diagnosis not present

## 2018-05-13 DIAGNOSIS — R441 Visual hallucinations: Secondary | ICD-10-CM | POA: Diagnosis present

## 2018-05-13 DIAGNOSIS — M069 Rheumatoid arthritis, unspecified: Secondary | ICD-10-CM | POA: Diagnosis not present

## 2018-05-13 DIAGNOSIS — E785 Hyperlipidemia, unspecified: Secondary | ICD-10-CM | POA: Diagnosis present

## 2018-05-13 DIAGNOSIS — N184 Chronic kidney disease, stage 4 (severe): Secondary | ICD-10-CM | POA: Diagnosis not present

## 2018-05-13 DIAGNOSIS — F02818 Dementia in other diseases classified elsewhere, unspecified severity, with other behavioral disturbance: Secondary | ICD-10-CM | POA: Diagnosis present

## 2018-05-13 DIAGNOSIS — R4182 Altered mental status, unspecified: Secondary | ICD-10-CM

## 2018-05-13 DIAGNOSIS — E1122 Type 2 diabetes mellitus with diabetic chronic kidney disease: Secondary | ICD-10-CM | POA: Diagnosis not present

## 2018-05-13 DIAGNOSIS — E039 Hypothyroidism, unspecified: Secondary | ICD-10-CM | POA: Diagnosis not present

## 2018-05-13 DIAGNOSIS — R627 Adult failure to thrive: Secondary | ICD-10-CM | POA: Diagnosis present

## 2018-05-13 DIAGNOSIS — Z87891 Personal history of nicotine dependence: Secondary | ICD-10-CM | POA: Diagnosis not present

## 2018-05-13 DIAGNOSIS — R7989 Other specified abnormal findings of blood chemistry: Secondary | ICD-10-CM | POA: Diagnosis present

## 2018-05-13 DIAGNOSIS — Z794 Long term (current) use of insulin: Secondary | ICD-10-CM | POA: Diagnosis not present

## 2018-05-13 DIAGNOSIS — I129 Hypertensive chronic kidney disease with stage 1 through stage 4 chronic kidney disease, or unspecified chronic kidney disease: Secondary | ICD-10-CM | POA: Diagnosis not present

## 2018-05-13 DIAGNOSIS — E860 Dehydration: Secondary | ICD-10-CM

## 2018-05-13 DIAGNOSIS — Z7982 Long term (current) use of aspirin: Secondary | ICD-10-CM

## 2018-05-13 DIAGNOSIS — Z885 Allergy status to narcotic agent status: Secondary | ICD-10-CM

## 2018-05-13 DIAGNOSIS — N390 Urinary tract infection, site not specified: Secondary | ICD-10-CM | POA: Diagnosis present

## 2018-05-13 DIAGNOSIS — F0390 Unspecified dementia without behavioral disturbance: Secondary | ICD-10-CM

## 2018-05-13 DIAGNOSIS — M810 Age-related osteoporosis without current pathological fracture: Secondary | ICD-10-CM | POA: Diagnosis present

## 2018-05-13 DIAGNOSIS — K219 Gastro-esophageal reflux disease without esophagitis: Secondary | ICD-10-CM | POA: Diagnosis present

## 2018-05-13 DIAGNOSIS — D631 Anemia in chronic kidney disease: Secondary | ICD-10-CM | POA: Diagnosis not present

## 2018-05-13 DIAGNOSIS — E876 Hypokalemia: Secondary | ICD-10-CM | POA: Diagnosis present

## 2018-05-13 DIAGNOSIS — E118 Type 2 diabetes mellitus with unspecified complications: Secondary | ICD-10-CM

## 2018-05-13 DIAGNOSIS — N179 Acute kidney failure, unspecified: Secondary | ICD-10-CM | POA: Diagnosis not present

## 2018-05-13 DIAGNOSIS — I10 Essential (primary) hypertension: Secondary | ICD-10-CM | POA: Diagnosis not present

## 2018-05-13 DIAGNOSIS — G309 Alzheimer's disease, unspecified: Secondary | ICD-10-CM | POA: Diagnosis not present

## 2018-05-13 DIAGNOSIS — G300 Alzheimer's disease with early onset: Secondary | ICD-10-CM | POA: Diagnosis not present

## 2018-05-13 DIAGNOSIS — G308 Other Alzheimer's disease: Secondary | ICD-10-CM | POA: Diagnosis not present

## 2018-05-13 HISTORY — DX: Urinary tract infection, site not specified: N39.0

## 2018-05-13 LAB — CBC
HCT: 40.1 % (ref 36.0–46.0)
Hemoglobin: 12.8 g/dL (ref 12.0–15.0)
MCH: 29 pg (ref 26.0–34.0)
MCHC: 31.9 g/dL (ref 30.0–36.0)
MCV: 90.7 fL (ref 80.0–100.0)
PLATELETS: 405 10*3/uL — AB (ref 150–400)
RBC: 4.42 MIL/uL (ref 3.87–5.11)
RDW: 13 % (ref 11.5–15.5)
WBC: 9.4 10*3/uL (ref 4.0–10.5)
nRBC: 0 % (ref 0.0–0.2)

## 2018-05-13 LAB — COMPREHENSIVE METABOLIC PANEL
ALBUMIN: 4.1 g/dL (ref 3.5–5.0)
ALT: 23 U/L (ref 0–44)
ANION GAP: 15 (ref 5–15)
AST: 41 U/L (ref 15–41)
Alkaline Phosphatase: 34 U/L — ABNORMAL LOW (ref 38–126)
BILIRUBIN TOTAL: 0.9 mg/dL (ref 0.3–1.2)
BUN: 25 mg/dL — ABNORMAL HIGH (ref 8–23)
CALCIUM: 11.2 mg/dL — AB (ref 8.9–10.3)
CO2: 21 mmol/L — ABNORMAL LOW (ref 22–32)
Chloride: 103 mmol/L (ref 98–111)
Creatinine, Ser: 2.41 mg/dL — ABNORMAL HIGH (ref 0.44–1.00)
GFR calc Af Amer: 23 mL/min — ABNORMAL LOW (ref >60)
GFR, EST NON AFRICAN AMERICAN: 20 mL/min — AB (ref >60)
GLUCOSE: 153 mg/dL — AB (ref 70–99)
Potassium: 3.5 mmol/L (ref 3.5–5.1)
Sodium: 139 mmol/L (ref 135–145)
TOTAL PROTEIN: 7.2 g/dL (ref 6.5–8.1)

## 2018-05-13 LAB — CBG MONITORING, ED: GLUCOSE-CAPILLARY: 151 mg/dL — AB (ref 70–99)

## 2018-05-13 LAB — URINALYSIS, ROUTINE W REFLEX MICROSCOPIC
Glucose, UA: 50 mg/dL — AB
Ketones, ur: 5 mg/dL — AB
NITRITE: NEGATIVE
PROTEIN: 100 mg/dL — AB
Specific Gravity, Urine: 1.021 (ref 1.005–1.030)
WBC, UA: >50 WBC/hpf — ABNORMAL HIGH (ref 0–5)
pH: 5 (ref 5.0–8.0)

## 2018-05-13 MED ORDER — FENOFIBRATE 160 MG PO TABS
160.0000 mg | ORAL_TABLET | Freq: Every day | ORAL | Status: DC
  Administered 2018-05-14 – 2018-05-17 (×4): 160 mg via ORAL
  Filled 2018-05-13 (×4): qty 1

## 2018-05-13 MED ORDER — INSULIN ASPART 100 UNIT/ML ~~LOC~~ SOLN
0.0000 [IU] | Freq: Three times a day (TID) | SUBCUTANEOUS | Status: DC
  Administered 2018-05-14: 2 [IU] via SUBCUTANEOUS
  Administered 2018-05-14: 1 [IU] via SUBCUTANEOUS
  Administered 2018-05-15: 3 [IU] via SUBCUTANEOUS
  Administered 2018-05-15: 2 [IU] via SUBCUTANEOUS
  Administered 2018-05-15: 1 [IU] via SUBCUTANEOUS
  Filled 2018-05-13 (×3): qty 1

## 2018-05-13 MED ORDER — ACETAMINOPHEN 650 MG RE SUPP: 650.0000 mg | Freq: Four times a day (QID) | RECTAL | Status: DC | PRN

## 2018-05-13 MED ORDER — HEPARIN SODIUM (PORCINE) 5000 UNIT/ML IJ SOLN
5000.0000 [IU] | Freq: Three times a day (TID) | INTRAMUSCULAR | Status: DC
  Administered 2018-05-14 – 2018-05-17 (×9): 5000 [IU] via SUBCUTANEOUS
  Filled 2018-05-13 (×8): qty 1

## 2018-05-13 MED ORDER — PANTOPRAZOLE SODIUM 40 MG PO TBEC
40.0000 mg | DELAYED_RELEASE_TABLET | Freq: Every day | ORAL | Status: DC
  Administered 2018-05-14 – 2018-05-17 (×4): 40 mg via ORAL
  Filled 2018-05-13 (×4): qty 1

## 2018-05-13 MED ORDER — ACETAMINOPHEN 325 MG PO TABS
650.0000 mg | ORAL_TABLET | Freq: Four times a day (QID) | ORAL | Status: DC | PRN
  Administered 2018-05-15 (×2): 650 mg via ORAL
  Filled 2018-05-13 (×2): qty 2

## 2018-05-13 MED ORDER — ONDANSETRON HCL 4 MG PO TABS
4.0000 mg | ORAL_TABLET | Freq: Four times a day (QID) | ORAL | Status: DC | PRN
  Administered 2018-05-15: 4 mg via ORAL
  Filled 2018-05-13: qty 1

## 2018-05-13 MED ORDER — INSULIN ASPART 100 UNIT/ML ~~LOC~~ SOLN: 0.0000 [IU] | Freq: Every day | SUBCUTANEOUS | Status: DC

## 2018-05-13 MED ORDER — FOLIC ACID 1 MG PO TABS
1.0000 mg | ORAL_TABLET | Freq: Every day | ORAL | Status: DC
  Administered 2018-05-14 – 2018-05-17 (×4): 1 mg via ORAL
  Filled 2018-05-13 (×4): qty 1

## 2018-05-13 MED ORDER — ADULT MULTIVITAMIN W/MINERALS CH
1.0000 | ORAL_TABLET | Freq: Every day | ORAL | Status: DC
  Administered 2018-05-14 – 2018-05-17 (×4): 1 via ORAL
  Filled 2018-05-13 (×4): qty 1

## 2018-05-13 MED ORDER — RISPERIDONE 0.5 MG PO TABS
0.5000 mg | ORAL_TABLET | Freq: Every day | ORAL | Status: DC
  Administered 2018-05-14 – 2018-05-17 (×4): 0.5 mg via ORAL
  Filled 2018-05-13 (×5): qty 1

## 2018-05-13 MED ORDER — SODIUM CHLORIDE 0.9 % IV SOLN
1.0000 g | INTRAVENOUS | Status: DC
  Administered 2018-05-14 – 2018-05-16 (×3): 1 g via INTRAVENOUS
  Filled 2018-05-13 (×3): qty 10

## 2018-05-13 MED ORDER — METOPROLOL SUCCINATE ER 50 MG PO TB24
50.0000 mg | ORAL_TABLET | Freq: Every day | ORAL | Status: DC
  Administered 2018-05-14 – 2018-05-16 (×3): 50 mg via ORAL
  Filled 2018-05-13: qty 1
  Filled 2018-05-13: qty 2
  Filled 2018-05-13 (×2): qty 1

## 2018-05-13 MED ORDER — SODIUM CHLORIDE 0.9 % IV BOLUS
1000.0000 mL | Freq: Once | INTRAVENOUS | Status: AC
  Administered 2018-05-13: 1000 mL via INTRAVENOUS

## 2018-05-13 MED ORDER — DONEPEZIL HCL 10 MG PO TABS
10.0000 mg | ORAL_TABLET | Freq: Every day | ORAL | Status: DC
  Administered 2018-05-14 – 2018-05-16 (×3): 10 mg via ORAL
  Filled 2018-05-13 (×3): qty 1

## 2018-05-13 MED ORDER — SENNOSIDES-DOCUSATE SODIUM 8.6-50 MG PO TABS: 1.0000 | ORAL_TABLET | Freq: Every evening | ORAL | Status: DC | PRN

## 2018-05-13 MED ORDER — ASPIRIN 81 MG PO CHEW
81.0000 mg | CHEWABLE_TABLET | Freq: Every day | ORAL | Status: DC
  Administered 2018-05-14 – 2018-05-17 (×4): 81 mg via ORAL
  Filled 2018-05-13 (×4): qty 1

## 2018-05-13 MED ORDER — LEVOTHYROXINE SODIUM 100 MCG PO TABS
100.0000 ug | ORAL_TABLET | Freq: Every day | ORAL | Status: DC
  Administered 2018-05-14 – 2018-05-17 (×4): 100 ug via ORAL
  Filled 2018-05-13 (×4): qty 1

## 2018-05-13 MED ORDER — MEMANTINE HCL 10 MG PO TABS
10.0000 mg | ORAL_TABLET | Freq: Two times a day (BID) | ORAL | Status: DC
  Administered 2018-05-14 – 2018-05-17 (×7): 10 mg via ORAL
  Filled 2018-05-13 (×8): qty 1

## 2018-05-13 MED ORDER — ATORVASTATIN CALCIUM 80 MG PO TABS
80.0000 mg | ORAL_TABLET | Freq: Every day | ORAL | Status: DC
  Administered 2018-05-14 – 2018-05-16 (×3): 80 mg via ORAL
  Filled 2018-05-13 (×4): qty 1

## 2018-05-13 MED ORDER — ONDANSETRON HCL 4 MG/2ML IJ SOLN
4.0000 mg | Freq: Four times a day (QID) | INTRAMUSCULAR | Status: DC | PRN
  Administered 2018-05-15: 4 mg via INTRAVENOUS
  Filled 2018-05-13 (×2): qty 2

## 2018-05-13 MED ORDER — METOCLOPRAMIDE HCL 10 MG PO TABS
10.0000 mg | ORAL_TABLET | Freq: Three times a day (TID) | ORAL | Status: DC
  Administered 2018-05-14 – 2018-05-17 (×8): 10 mg via ORAL
  Filled 2018-05-13 (×9): qty 1

## 2018-05-13 MED ORDER — SODIUM CHLORIDE 0.9 % IV SOLN
1.0000 g | Freq: Once | INTRAVENOUS | Status: AC
  Administered 2018-05-13: 1 g via INTRAVENOUS
  Filled 2018-05-13: qty 10

## 2018-05-13 NOTE — ED Provider Notes (Signed)
[facility] EMERGENCY DEPARTMENT Provider Note   CSN: [id number] Arrival date & time: 05/13/18  1912     History   Chief Complaint Chief Complaint  Patient presents with  . Altered Mental Status    HPI Casey Jennings is a 67 y.o. female.  Patient w hx dementia, presents with poor po intake, decreased alertness/altered mental status, generalized weakness in the past several days. Symptoms gradual onset, constant, persistent, worsening. Pt very limited historian - level 5 caveat. No fevers. Pt denies pain - no headache, no chest pain, no abd pain. Denies dysuria. +odor to urine.   The history is provided by the patient and a relative. The history is limited by the condition of the patient.  Altered Mental Status   Associated symptoms include confusion.    Past Medical History:  Diagnosis Date  . Anemia   . Arthritis   . Chronic kidney disease    hx of uti   . Depression   . Diabetes mellitus without complication ([place])   . GERD (gastroesophageal reflux disease)   . Heart murmur   . Hypercalcemia   . Hyperlipidemia   . Hypertension   . Memory loss   . Osteopenia   . Osteoporosis   . Yeast infection    questinable per pt on 05/23/12- to let MD be aware of     Patient Active Problem List   Diagnosis Date Noted  . Dementia with behavioral disturbance ([place]) 10/31/2017  . Alzheimer's dementia with behavioral disturbance ([place]) 02/25/2017  . Memory loss 12/24/2016  . Visual hallucination 12/24/2016  . Diabetes mellitus type 2 with complications ([place]) 44/97/5300  . Gastroparesis due to DM ([place]) 04/11/2014  . Rheumatoid arthritis ([place]) 04/11/2014  . Cholecystitis 04/10/2014  . Hyperparathyroidism, primary ([place]) 05/12/2012    Past Surgical History:  Procedure Laterality Date  . BACK SURGERY    . BLADDER SUSPENSION    . CARPAL TUNNEL RELEASE    . CESAREAN SECTION    . CHOLECYSTECTOMY N/A 04/11/2014   Procedure: LAPAROSCOPIC CHOLECYSTECTOMY WITH  INTRAOPERATIVE CHOLANGIOGRAM;  Surgeon: [name], MD;  Location: [facility];  Service: General;  Laterality: N/A;  . PARATHYROIDECTOMY  05/27/2012   Procedure: PARATHYROIDECTOMY;  Surgeon: Earnstine Regal, MD;  Location: [facility];  Service: General;  Laterality: N/A;  . right ankle surgery      due to fracture   . SPINE SURGERY    . TONSILLECTOMY       OB History   None      Home Medications    Prior to Admission medications   Medication Sig Start Date End Date Taking? Authorizing Provider  aspirin 81 MG tablet Take 81 mg by mouth daily.   Yes [provider]  atorvastatin (LIPITOR) 80 MG tablet Take 80 mg by mouth at bedtime.    Yes [provider]  Calcium Carbonate-Vitamin D (CALCIUM 600 + D PO) Take 600 mg by mouth daily.   Yes [provider]  Choline Fenofibrate (TRILIPIX) 135 MG capsule Take 135 mg by mouth daily.   Yes [provider]  donepezil (ARICEPT) 10 MG tablet Take 1 tablet (10 mg total) by mouth at bedtime. 04/29/17  Yes Marcial Pacas, MD  esomeprazole (NEXIUM) 40 MG capsule Take 40 mg by mouth daily.    Yes [provider]  folic acid (FOLVITE) 1 MG tablet Take 1 mg by mouth daily.   Yes [provider]  insulin detemir (LEVEMIR) 100 UNIT/ML injection Inject  40 Units into the skin at bedtime.   Yes [provider]  levothyroxine (SYNTHROID, LEVOTHROID) 100 MCG tablet Take 100 mcg by mouth daily before breakfast.   Yes [provider]  linagliptin (TRADJENTA) 5 MG TABS tablet Take 5 mg by mouth at bedtime.    Yes [provider]  losartan-hydrochlorothiazide (HYZAAR) 100-12.5 MG tablet Take 1 tablet by mouth daily. 10/28/16  Yes [provider]  memantine (NAMENDA) 10 MG tablet Take 1 tablet (10 mg total) by mouth 2 (two) times daily. 07/30/17  Yes Marcial Pacas, MD  methotrexate (RHEUMATREX) 2.5 MG tablet Take 20 mg by mouth once a week. On Wednesdays.  Caution:Chemotherapy. Protect from light.    Yes [provider]  metoCLOPramide (REGLAN) 10 MG tablet Take 10 mg by mouth 3 (three) times daily before meals.    Yes [provider]  metoprolol succinate (TOPROL-XL) 50 MG 24 hr tablet Take 50 mg by mouth daily. Take with or immediately following a meal.   Yes [provider]  Multiple Vitamin (MULTIVITAMIN WITH MINERALS) TABS Take 1 tablet by mouth daily.   Yes [provider]  Omega-3 Fatty Acids (FISH OIL PO) Take 1,200 mg by mouth daily.   Yes [provider]  risperiDONE (RISPERDAL) 0.5 MG tablet One in the morning, 2 tablets at night Patient taking differently: 1 in the morning, 2 tablets at night 07/30/17  Yes Marcial Pacas, MD  Vitamin D, Ergocalciferol, (DRISDOL) 50000 UNITS CAPS capsule Take 50,000 Units by mouth. Tuesdays and Fridays.   Yes [provider]  doxycycline (VIBRAMYCIN) 100 MG capsule Take 100 mg by mouth 2 (two) times daily.    [provider]  nitroGLYCERIN (NITROSTAT) 0.4 MG SL tablet Place 0.4 mg under the tongue every 5 (five) minutes as needed for chest pain.    [provider]  phenazopyridine (PYRIDIUM) 200 MG tablet Take 1 tablet (200 mg total) by mouth 3 (three) times daily. Patient not taking: Reported on 05/13/2018 04/04/18   Recardo Evangelist, PA-C    Family History Family History  Problem Relation Age of Onset  . Diabetes Mother   . Heart attack Father     Social History Social History   Tobacco Use  . Smoking status: Former Smoker    Last attempt to quit: 05/07/1978    Years since quitting: 40.0  . Smokeless tobacco: Never Used  Substance Use Topics  . Alcohol use: Yes    Comment:  1- 2 times per year  . Drug use: No     Allergies   Codeine   Review of Systems Review of Systems  Constitutional: Negative for fever.  HENT: Negative for sore throat.   Eyes: Negative for redness.  Respiratory: Negative for shortness of breath.   Cardiovascular: Negative for chest  pain.  Gastrointestinal: Negative for abdominal pain.  Genitourinary: Negative for flank pain.  Musculoskeletal: Negative for neck pain.  Skin: Negative for rash.  Neurological: Negative for headaches.  Hematological: Does not bruise/bleed easily.  Psychiatric/Behavioral: Positive for confusion.     Physical Exam Updated Vital Signs BP (!) 102/46   Pulse (!) 105   Temp 98.4 F (36.9 C) (Oral)   Resp 16   SpO2 100%   Physical Exam  Constitutional: She appears well-developed and well-nourished.  HENT:  Head: Atraumatic.  Eyes: Pupils are equal, round, and reactive to light. Conjunctivae are normal. No scleral icterus.  Neck: Neck supple. No tracheal deviation present.  No stiffness or rigidity.  Cardiovascular: Normal rate, regular rhythm, normal heart sounds and intact distal pulses. Exam reveals no gallop and no friction rub.  No murmur heard. Pulmonary/Chest: Effort normal and breath sounds normal. No respiratory distress.  Abdominal: Soft. Normal appearance and bowel sounds are normal. She exhibits no distension and no mass. There is no tenderness. There is no guarding.  Genitourinary:  Genitourinary Comments: No cva tenderness  Musculoskeletal: She exhibits no edema.  Neurological: She is alert.  Speech quiet, slow, brief. Moves bil extremities purposefully w good strength.   Skin: Skin is warm and dry. No rash noted.  Psychiatric:  Slow to respond, withdrawn.   Nursing note and vitals reviewed.    ED Treatments / Results  Labs (all labs ordered are listed, but only abnormal results are displayed) Results for orders placed or performed during the hospital encounter of 05/13/18  Comprehensive metabolic panel  Result Value Ref Range   Sodium 139 135 - 145 mmol/L   Potassium 3.5 3.5 - 5.1 mmol/L   Chloride 103 98 - 111 mmol/L   CO2 21 (L) 22 - 32 mmol/L   Glucose, Bld 153 (H) 70 - 99 mg/dL   BUN 25 (H) 8 - 23 mg/dL   Creatinine, Ser 2.41 (H) 0.44 - 1.00 mg/dL    Calcium 11.2 (H) 8.9 - 10.3 mg/dL   Total Protein 7.2 6.5 - 8.1 g/dL   Albumin 4.1 3.5 - 5.0 g/dL   AST 41 15 - 41 U/L   ALT 23 0 - 44 U/L   Alkaline Phosphatase 34 (L) 38 - 126 U/L   Total Bilirubin 0.9 0.3 - 1.2 mg/dL   GFR calc non Af Amer 20 (L) >60 mL/min   GFR calc Af Amer 23 (L) >60 mL/min   Anion gap 15 5 - 15  CBC  Result Value Ref Range   WBC 9.4 4.0 - 10.5 K/uL   RBC 4.42 3.87 - 5.11 MIL/uL   Hemoglobin 12.8 12.0 - 15.0 g/dL   HCT 40.1 36.0 - 46.0 %   MCV 90.7 80.0 - 100.0 fL   MCH 29.0 26.0 - 34.0 pg   MCHC 31.9 30.0 - 36.0 g/dL   RDW 13.0 11.5 - 15.5 %   Platelets 405 (H) 150 - 400 K/uL   nRBC 0.0 0.0 - 0.2 %  Urinalysis, Routine w reflex microscopic  Result Value Ref Range   Color, Urine AMBER (A) YELLOW   APPearance CLOUDY (A) CLEAR   Specific Gravity, Urine 1.021 1.005 - 1.030   pH 5.0 5.0 - 8.0   Glucose, UA 50 (A) NEGATIVE mg/dL   Hgb urine dipstick LARGE (A) NEGATIVE   Bilirubin Urine SMALL (A) NEGATIVE   Ketones, ur 5 (A) NEGATIVE mg/dL   Protein, ur 100 (A) NEGATIVE mg/dL   Nitrite NEGATIVE NEGATIVE   Leukocytes, UA LARGE (A) NEGATIVE   RBC / HPF 21-50 0 - 5 RBC/hpf   WBC, UA >50 (H) 0 - 5 WBC/hpf   Bacteria, UA FEW (A) NONE SEEN   Squamous Epithelial / LPF 0-5 0 - 5   Mucus PRESENT    Hyaline Casts, UA PRESENT    Granular Casts, UA PRESENT    Non Squamous Epithelial 6-10 (A) NONE SEEN  CBG monitoring, ED  Result Value Ref Range   Glucose-Capillary 151 (H) 70 - 99 mg/dL   Comment 1 Notify RN    Comment 2 Document in Chart     EKG None  Radiology No results found.  Procedures Procedures (  including critical care time)  Medications Ordered in ED Medications - No data to display   Initial Impression / Assessment and Plan / ED Course  I have reviewed the triage vital signs and the nursing notes.  Pertinent labs & imaging results that were available during my care of the patient were reviewed by me and considered in my medical decision  making (see chart for details).  Iv ns bolus. Labs.  Reviewed nursing notes and prior charts for additional history.   Labs reviewed - c/w uti. hco3 mildly low, bun elev, ca elev - c/w dehydration.   Iv ns boluses. Urine cx sent. Rocephin iv.   Given dehydration, weakness, poor po intake, uti - will admit.  hospitalists consulted for admission.   Final Clinical Impressions(s) / ED Diagnoses   Final diagnoses:  None    ED Discharge Orders    None       Lajean Saver, MD 05/13/18 2215

## 2018-05-13 NOTE — ED Triage Notes (Addendum)
Pt and family reports altered mental status, she is unable to express her thoughts in triage. Pt's family reports she has not been eating, drinking or taking her medications.  She is only only oriented to self.  Pt displays no neuro deficits, has a tremor however family indicates hx.  Hx of this behaviors, dementia however concerned about meds and not eating

## 2018-05-13 NOTE — H&P (Signed)
History and Physical    Angelicia Lessner YSA:[id number] DOB: [birth year] DOA: 05/13/2018  PCP: Reynold Bowen, MD   Patient coming from: Home   Chief Complaint: Not eating, gen weakness, suprapubic discomfort, nausea, dysuria   HPI: Casey Jennings is a 67 y.o. female with medical history significant for dementia with behavioral disturbance, rheumatoid arthritis, insulin-dependent diabetes mellitus, chronic kidney disease, hypertension, and hypothyroidism, now presenting to the emergency department for evaluation of anorexia, generalized weakness, nausea, suprapubic pain, and dysuria.  Patient is accompanied by her husband who assist with the history.  She has not been eating much of anything over the past 6 or 7 days per report of her husband, and has not been taking her medications.  She denies abdominal pain per se, but does report some suprapubic discomfort and nausea.  She denies vomiting or diarrhea.  She reports not having an appetite.  Denies headache, change in vision or hearing, or focal numbness or weakness.  No recent fall or trauma.  She reports some lightheadedness, particularly upon standing or sitting up, but denies any loss of consciousness.  She also denies chest pain, palpitations, cough, or shortness of breath.  Denies fevers or chills.  ED Course: Upon arrival to the ED, patient is found to be afebrile, saturating well on room air, slightly tachycardic, and with stable blood pressure.  Chemistry panel is notable for a creatinine of 2.41, similar to prior.  CBC features a mild thrombocytosis.  Urinalysis is suggestive of infection.  Patient was given a liter of normal saline, urine culture was ordered, and she was treated with a gram of IV Rocephin in the ED.  She remains hemodynamically stable and will be observed for ongoing evaluation and management of anorexia and generalized weakness suspected secondary to acute UTI.  Review of Systems:  All other systems reviewed and apart  from HPI, are negative.  Past Medical History:  Diagnosis Date  . Anemia   . Arthritis   . Chronic kidney disease    hx of uti   . Depression   . Diabetes mellitus without complication ([place])   . GERD (gastroesophageal reflux disease)   . Heart murmur   . Hypercalcemia   . Hyperlipidemia   . Hypertension   . Memory loss   . Osteopenia   . Osteoporosis   . Yeast infection    questinable per pt on 05/23/12- to let MD be aware of     Past Surgical History:  Procedure Laterality Date  . BACK SURGERY    . BLADDER SUSPENSION    . CARPAL TUNNEL RELEASE    . CESAREAN SECTION    . CHOLECYSTECTOMY N/A 04/11/2014   Procedure: LAPAROSCOPIC CHOLECYSTECTOMY WITH INTRAOPERATIVE CHOLANGIOGRAM;  Surgeon: [name], MD;  Location: [facility];  Service: General;  Laterality: N/A;  . PARATHYROIDECTOMY  05/27/2012   Procedure: PARATHYROIDECTOMY;  Surgeon: Earnstine Regal, MD;  Location: [facility];  Service: General;  Laterality: N/A;  . right ankle surgery      due to fracture   . SPINE SURGERY    . TONSILLECTOMY       reports that she quit smoking about 40 years ago. She has never used smokeless tobacco. She reports that she drinks alcohol. She reports that she does not use drugs.  Allergies  Allergen Reactions  . Codeine     hallucinations    Family History  Problem Relation Age of Onset  . Diabetes Mother   . Heart attack Father  Prior to Admission medications   Medication Sig Start Date End Date Taking? Authorizing Provider  aspirin 81 MG tablet Take 81 mg by mouth daily.   Yes [provider]  atorvastatin (LIPITOR) 80 MG tablet Take 80 mg by mouth at bedtime.    Yes [provider]  Calcium Carbonate-Vitamin D (CALCIUM 600 + D PO) Take 600 mg by mouth daily.   Yes [provider]  Choline Fenofibrate (TRILIPIX) 135 MG capsule Take 135 mg by mouth daily.   Yes [provider]  donepezil (ARICEPT) 10 MG tablet Take 1 tablet (10 mg total) by  mouth at bedtime. 04/29/17  Yes Marcial Pacas, MD  esomeprazole (NEXIUM) 40 MG capsule Take 40 mg by mouth daily.    Yes [provider]  folic acid (FOLVITE) 1 MG tablet Take 1 mg by mouth daily.   Yes [provider]  insulin detemir (LEVEMIR) 100 UNIT/ML injection Inject 40 Units into the skin at bedtime.   Yes [provider]  levothyroxine (SYNTHROID, LEVOTHROID) 100 MCG tablet Take 100 mcg by mouth daily before breakfast.   Yes [provider]  linagliptin (TRADJENTA) 5 MG TABS tablet Take 5 mg by mouth at bedtime.    Yes [provider]  losartan-hydrochlorothiazide (HYZAAR) 100-12.5 MG tablet Take 1 tablet by mouth daily. 10/28/16  Yes [provider]  memantine (NAMENDA) 10 MG tablet Take 1 tablet (10 mg total) by mouth 2 (two) times daily. 07/30/17  Yes Marcial Pacas, MD  methotrexate (RHEUMATREX) 2.5 MG tablet Take 20 mg by mouth once a week. On Wednesdays.  Caution:Chemotherapy. Protect from light.   Yes [provider]  metoCLOPramide (REGLAN) 10 MG tablet Take 10 mg by mouth 3 (three) times daily before meals.    Yes [provider]  metoprolol succinate (TOPROL-XL) 50 MG 24 hr tablet Take 50 mg by mouth daily. Take with or immediately following a meal.   Yes [provider]  Multiple Vitamin (MULTIVITAMIN WITH MINERALS) TABS Take 1 tablet by mouth daily.   Yes [provider]  Omega-3 Fatty Acids (FISH OIL PO) Take 1,200 mg by mouth daily.   Yes [provider]  risperiDONE (RISPERDAL) 0.5 MG tablet One in the morning, 2 tablets at night Patient taking differently: 1 in the morning, 2 tablets at night 07/30/17  Yes Marcial Pacas, MD  Vitamin D, Ergocalciferol, (DRISDOL) 50000 UNITS CAPS capsule Take 50,000 Units by mouth. Tuesdays and Fridays.   Yes [provider]  doxycycline (VIBRAMYCIN) 100 MG capsule Take 100 mg by mouth 2 (two) times daily.    [provider]  nitroGLYCERIN  (NITROSTAT) 0.4 MG SL tablet Place 0.4 mg under the tongue every 5 (five) minutes as needed for chest pain.    [provider]    Physical Exam: Vitals:   05/13/18 1931 05/13/18 2100  BP: 119/81 (!) 102/46  Pulse: 89 (!) 105  Resp: 18 16  Temp: 98.4 F (36.9 C)   TempSrc: Oral   SpO2: 100%     Constitutional: NAD, calm  Eyes: PERTLA, lids and conjunctivae normal ENMT: Mucous membranes are parched. Posterior pharynx clear of any exudate or lesions.   Neck: normal, supple, no masses, no thyromegaly Respiratory: clear to auscultation bilaterally, no wheezing, no crackles. Normal respiratory effort.    Cardiovascular: S1 & S2 heard, regular rate and rhythm. Trace pedal edema. Abdomen: No distension, soft, suprapubic tenderness, no rebound pain or guarding. Bowel sounds active.  Musculoskeletal: no clubbing /  cyanosis. No joint deformity upper and lower extremities.    Skin: no significant rashes, lesions, ulcers. Poor turgor. Neurologic: CN 2-12 grossly intact. Sensation intact. Strength 5/5 in all 4 limbs. Resting tremor involving bilateral UE's.  Psychiatric: Alert and oriented to person, place, and situation, but not oriented to month or year. Blunted affect, calm, cooperative.    Labs on Admission: I have personally reviewed following labs and imaging studies  CBC: Recent Labs  Lab 05/13/18 1948  WBC 9.4  HGB 12.8  HCT 40.1  MCV 90.7  PLT 846*   Basic Metabolic Panel: Recent Labs  Lab 05/13/18 1948  NA 139  K 3.5  CL 103  CO2 21*  GLUCOSE 153*  BUN 25*  CREATININE 2.41*  CALCIUM 11.2*   GFR: CrCl cannot be calculated (Unknown ideal weight.). Liver Function Tests: Recent Labs  Lab 05/13/18 1948  AST 41  ALT 23  ALKPHOS 34*  BILITOT 0.9  PROT 7.2  ALBUMIN 4.1   No results for input(s): LIPASE, AMYLASE in the last 168 hours. No results for input(s): AMMONIA in the last 168 hours. Coagulation Profile: No results for input(s): INR, PROTIME in  the last 168 hours. Cardiac Enzymes: No results for input(s): CKTOTAL, CKMB, CKMBINDEX, TROPONINI in the last 168 hours. BNP (last 3 results) No results for input(s): PROBNP in the last 8760 hours. HbA1C: No results for input(s): HGBA1C in the last 72 hours. CBG: Recent Labs  Lab 05/13/18 1928  GLUCAP 151*   Lipid Profile: No results for input(s): CHOL, HDL, LDLCALC, TRIG, CHOLHDL, LDLDIRECT in the last 72 hours. Thyroid Function Tests: No results for input(s): TSH, T4TOTAL, FREET4, T3FREE, THYROIDAB in the last 72 hours. Anemia Panel: No results for input(s): VITAMINB12, FOLATE, FERRITIN, TIBC, IRON, RETICCTPCT in the last 72 hours. Urine analysis:    Component Value Date/Time   COLORURINE AMBER (A) 05/13/2018 2104   APPEARANCEUR CLOUDY (A) 05/13/2018 2104   LABSPEC 1.021 05/13/2018 2104   PHURINE 5.0 05/13/2018 2104   GLUCOSEU 50 (A) 05/13/2018 2104   HGBUR LARGE (A) 05/13/2018 2104   BILIRUBINUR SMALL (A) 05/13/2018 2104   KETONESUR 5 (A) 05/13/2018 2104   PROTEINUR 100 (A) 05/13/2018 2104   UROBILINOGEN 1.0 04/10/2014 1831   NITRITE NEGATIVE 05/13/2018 2104   LEUKOCYTESUR LARGE (A) 05/13/2018 2104   Sepsis Labs: @LABRCNTIP (procalcitonin:4,lacticidven:4) )No results found for this or any previous visit (from the past 240 hour(s)).   Radiological Exams on Admission: No results found.  EKG: Not performed.    Assessment/Plan   1. UTI  - Presents with ~1 wk of anorexia, gen weakness, suprapubic pain, malodorous urine, and dysuria  - UA consistent with infection and culture was ordered   - Treated with Rocephin in ED  - Continue empiric Rocephin, follow culture and clinical course    2. Anorexia, generalized weakness  - Presents with ~1 wk of anorexia and generalized weakness  - No focal neurologic findings - Suspected secondary to UTI  - Treat UTI as above, check TSH, B12, folate, and CK, and continue supportive care    3. Dementia  - Continue Aricept,  Namenda, and Risperdal    4. Insulin-dependent DM  - A1c was 7.5% in 2018  - Managed at home with Levemir 40 units qHS  - She has not been eating much at home for the past wk and not taking her insulin  - Check CBG's and use a SSI with Novolog only for now    5. CKD stage IV  -  SCr is 2.41 on admission, similar to prior  - Renally-dose medications, continue IVF hydration, repeat chem panel in am   6. Hypothyroidism  - Check TSH given her presentation - Continue Synthroid   7. Rheumatoid arthritis  - Stable, managed with methotrexate, continue folate    8. Hypertension  - BP low-normal in ED  - Hold HCTZ while hydrating, continue Toprol as tolerated    DVT prophylaxis: sq heparin  Code Status: Full  Family Communication: Husband updated at bedside Consults called: None Admission status: Observation     [name], MD [facility] Pager [phone]  If 7PM-7AM, please contact night-coverage [email or web address] Password [email or web address]  05/13/2018, 11:00 PM

## 2018-05-14 ENCOUNTER — Encounter (HOSPITAL_COMMUNITY): Payer: Self-pay | Admitting: General Practice

## 2018-05-14 ENCOUNTER — Other Ambulatory Visit: Payer: Self-pay

## 2018-05-14 DIAGNOSIS — E1122 Type 2 diabetes mellitus with diabetic chronic kidney disease: Secondary | ICD-10-CM | POA: Diagnosis present

## 2018-05-14 DIAGNOSIS — N390 Urinary tract infection, site not specified: Secondary | ICD-10-CM | POA: Diagnosis present

## 2018-05-14 DIAGNOSIS — R627 Adult failure to thrive: Secondary | ICD-10-CM | POA: Diagnosis present

## 2018-05-14 DIAGNOSIS — M810 Age-related osteoporosis without current pathological fracture: Secondary | ICD-10-CM | POA: Diagnosis present

## 2018-05-14 DIAGNOSIS — K219 Gastro-esophageal reflux disease without esophagitis: Secondary | ICD-10-CM | POA: Diagnosis present

## 2018-05-14 DIAGNOSIS — R4182 Altered mental status, unspecified: Secondary | ICD-10-CM | POA: Diagnosis not present

## 2018-05-14 DIAGNOSIS — D631 Anemia in chronic kidney disease: Secondary | ICD-10-CM | POA: Diagnosis present

## 2018-05-14 DIAGNOSIS — N184 Chronic kidney disease, stage 4 (severe): Secondary | ICD-10-CM | POA: Diagnosis present

## 2018-05-14 DIAGNOSIS — Z794 Long term (current) use of insulin: Secondary | ICD-10-CM | POA: Diagnosis not present

## 2018-05-14 DIAGNOSIS — Z87891 Personal history of nicotine dependence: Secondary | ICD-10-CM | POA: Diagnosis not present

## 2018-05-14 DIAGNOSIS — R7989 Other specified abnormal findings of blood chemistry: Secondary | ICD-10-CM | POA: Diagnosis present

## 2018-05-14 DIAGNOSIS — M069 Rheumatoid arthritis, unspecified: Secondary | ICD-10-CM | POA: Diagnosis present

## 2018-05-14 DIAGNOSIS — I129 Hypertensive chronic kidney disease with stage 1 through stage 4 chronic kidney disease, or unspecified chronic kidney disease: Secondary | ICD-10-CM | POA: Diagnosis present

## 2018-05-14 DIAGNOSIS — E876 Hypokalemia: Secondary | ICD-10-CM | POA: Diagnosis present

## 2018-05-14 DIAGNOSIS — G309 Alzheimer's disease, unspecified: Secondary | ICD-10-CM | POA: Diagnosis present

## 2018-05-14 DIAGNOSIS — G308 Other Alzheimer's disease: Secondary | ICD-10-CM | POA: Diagnosis not present

## 2018-05-14 DIAGNOSIS — Z79899 Other long term (current) drug therapy: Secondary | ICD-10-CM | POA: Diagnosis not present

## 2018-05-14 DIAGNOSIS — Z7982 Long term (current) use of aspirin: Secondary | ICD-10-CM | POA: Diagnosis not present

## 2018-05-14 DIAGNOSIS — G300 Alzheimer's disease with early onset: Secondary | ICD-10-CM | POA: Diagnosis not present

## 2018-05-14 DIAGNOSIS — Z885 Allergy status to narcotic agent status: Secondary | ICD-10-CM | POA: Diagnosis not present

## 2018-05-14 DIAGNOSIS — E785 Hyperlipidemia, unspecified: Secondary | ICD-10-CM | POA: Diagnosis present

## 2018-05-14 DIAGNOSIS — F0281 Dementia in other diseases classified elsewhere with behavioral disturbance: Secondary | ICD-10-CM | POA: Diagnosis present

## 2018-05-14 DIAGNOSIS — N179 Acute kidney failure, unspecified: Secondary | ICD-10-CM | POA: Diagnosis present

## 2018-05-14 DIAGNOSIS — E039 Hypothyroidism, unspecified: Secondary | ICD-10-CM | POA: Diagnosis present

## 2018-05-14 DIAGNOSIS — E118 Type 2 diabetes mellitus with unspecified complications: Secondary | ICD-10-CM | POA: Diagnosis not present

## 2018-05-14 DIAGNOSIS — R441 Visual hallucinations: Secondary | ICD-10-CM | POA: Diagnosis present

## 2018-05-14 HISTORY — DX: Urinary tract infection, site not specified: N39.0

## 2018-05-14 LAB — CBC WITH DIFFERENTIAL/PLATELET
Abs Immature Granulocytes: 0.01 10*3/uL (ref 0.00–0.07)
Basophils Absolute: 0 10*3/uL (ref 0.0–0.1)
Basophils Relative: 1 %
EOS ABS: 0 10*3/uL (ref 0.0–0.5)
EOS PCT: 1 %
HCT: 29.8 % — ABNORMAL LOW (ref 36.0–46.0)
HEMOGLOBIN: 9.5 g/dL — AB (ref 12.0–15.0)
Immature Granulocytes: 0 %
LYMPHS ABS: 2.4 10*3/uL (ref 0.7–4.0)
LYMPHS PCT: 40 %
MCH: 28.6 pg (ref 26.0–34.0)
MCHC: 31.9 g/dL (ref 30.0–36.0)
MCV: 89.8 fL (ref 80.0–100.0)
MONO ABS: 0.5 10*3/uL (ref 0.1–1.0)
Monocytes Relative: 8 %
Neutro Abs: 3 10*3/uL (ref 1.7–7.7)
Neutrophils Relative %: 50 %
Platelets: 302 10*3/uL (ref 150–400)
RBC: 3.32 MIL/uL — ABNORMAL LOW (ref 3.87–5.11)
RDW: 13.1 % (ref 11.5–15.5)
WBC: 6 10*3/uL (ref 4.0–10.5)
nRBC: 0 % (ref 0.0–0.2)

## 2018-05-14 LAB — HIV ANTIBODY (ROUTINE TESTING W REFLEX): HIV SCREEN 4TH GENERATION: NONREACTIVE

## 2018-05-14 LAB — CBG MONITORING, ED: Glucose-Capillary: 119 mg/dL — ABNORMAL HIGH (ref 70–99)

## 2018-05-14 LAB — BASIC METABOLIC PANEL
Anion gap: 10 (ref 5–15)
BUN: 27 mg/dL — ABNORMAL HIGH (ref 8–23)
CALCIUM: 9.6 mg/dL (ref 8.9–10.3)
CHLORIDE: 106 mmol/L (ref 98–111)
CO2: 24 mmol/L (ref 22–32)
CREATININE: 2.28 mg/dL — AB (ref 0.44–1.00)
GFR calc non Af Amer: 21 mL/min — ABNORMAL LOW (ref >60)
GFR, EST AFRICAN AMERICAN: 24 mL/min — AB (ref >60)
Glucose, Bld: 106 mg/dL — ABNORMAL HIGH (ref 70–99)
Potassium: 2.8 mmol/L — ABNORMAL LOW (ref 3.5–5.1)
SODIUM: 140 mmol/L (ref 135–145)

## 2018-05-14 LAB — CK: Total CK: 107 U/L (ref 38–234)

## 2018-05-14 LAB — VITAMIN B12: Vitamin B-12: 1466 pg/mL — ABNORMAL HIGH (ref 180–914)

## 2018-05-14 LAB — GLUCOSE, CAPILLARY
GLUCOSE-CAPILLARY: 149 mg/dL — AB (ref 70–99)
GLUCOSE-CAPILLARY: 152 mg/dL — AB (ref 70–99)
Glucose-Capillary: 163 mg/dL — ABNORMAL HIGH (ref 70–99)

## 2018-05-14 LAB — MAGNESIUM: Magnesium: 1.2 mg/dL — ABNORMAL LOW (ref 1.7–2.4)

## 2018-05-14 LAB — TSH: TSH: 4.771 u[IU]/mL — ABNORMAL HIGH (ref 0.350–4.500)

## 2018-05-14 MED ORDER — SODIUM CHLORIDE 0.9 % IV SOLN: INTRAVENOUS | Status: DC | PRN

## 2018-05-14 MED ORDER — POTASSIUM CHLORIDE CRYS ER 20 MEQ PO TBCR
40.0000 meq | EXTENDED_RELEASE_TABLET | Freq: Once | ORAL | Status: AC
  Administered 2018-05-14: 40 meq via ORAL
  Filled 2018-05-14: qty 2

## 2018-05-14 MED ORDER — POTASSIUM CHLORIDE 10 MEQ/100ML IV SOLN
10.0000 meq | INTRAVENOUS | Status: DC
  Administered 2018-05-14 (×2): 10 meq via INTRAVENOUS
  Filled 2018-05-14 (×3): qty 100

## 2018-05-14 MED ORDER — RISPERIDONE 1 MG PO TABS
1.0000 mg | ORAL_TABLET | Freq: Every day | ORAL | Status: DC
  Administered 2018-05-14 – 2018-05-16 (×3): 1 mg via ORAL
  Filled 2018-05-14 (×3): qty 1

## 2018-05-14 NOTE — ED Notes (Signed)
Heart Healthy Diet was ordered for Lunch. 

## 2018-05-14 NOTE — ED Notes (Signed)
Got patient hooked back up to the monitor patient is resting with call bell in reach

## 2018-05-14 NOTE — ED Notes (Signed)
Breakfast tray ordered 

## 2018-05-14 NOTE — Progress Notes (Signed)
New Admission Note:   Arrival Method: Wheelchair from [facility] Mental Orientation: Alert and oriented x3 Telemetry: NA Assessment: Completed Skin: Intact IV:  Left AC NSL Pain: 0 Tubes: None Safety Measures: Safety Fall Prevention Plan has been discussed  Admission: Completed  5 [place] Orientation: Patient has been orientated to the room, unit and staff.   Family: at bedside   Orders to be reviewed and implemented. Will continue to monitor the patient. Call light has been placed within reach and bed alarm has been activated.   Baldo Ash, RN

## 2018-05-14 NOTE — Progress Notes (Signed)
Progress Note    Chelli Yerkes  XBM:[id number] DOB: May 03, 1951  DOA: 05/13/2018 PCP: Reynold Bowen, MD    Brief Narrative:     Medical records reviewed and are as summarized below:  Neta Upadhyay is an 67 y.o. female with medical history significant for dementia with behavioral disturbance, rheumatoid arthritis, insulin-dependent diabetes mellitus, chronic kidney disease, hypertension, and hypothyroidism, now presenting to the emergency department for evaluation of anorexia, generalized weakness, nausea, suprapubic pain, and dysuria.  Patient is accompanied by her husband who assist with the history.  She has not been eating much of anything over the past 6 or 7 days per report of her husband, and has not been taking her medications.  She denies abdominal pain per se, but does report some suprapubic discomfort and nausea.   Assessment/Plan:   Principal Problem:   Acute lower UTI Active Problems:   Diabetes mellitus type 2 with complications (HCC)   Rheumatoid arthritis (HCC)   Alzheimer's dementia with behavioral disturbance ([place])   Hypertension   CKD (chronic kidney disease), stage IV ([place])   Acute UTI  UTI Culture pending Rocephin started -was in the process of being set up for pelvic floor PT by [facility]  Weakness/anorexia Suspect due to UTI TSH: Mildly elevated at 4 (known hypothyroid)-- suspect elevated due to not taking her medications on a regular basis B12- >1000 Folate pending CK normal  Hypo kalemia -check Mg -replete in IVF  Anemia -? Dilutional -recheck CBC in AM -monitor stools for blood  Dementia Continue Aricept Namenda and Risperdal Has follow-up outpatient with Dr. Krista Blue Per husband at bedside patient has been having a decline for the last 1 year  Hypothyroidism See TSH above Continue Synthroid Exline recheck in 6 weeks  Chronic kidney disease stage IV (seems to have worsened since 2015-- 1.2) Creatinine at 2.41 Monitor  closely  Rheumatoid arthritis Continue methotrexate and folate  Insulin-dependent diabetes Sliding scale insulin  -hold Levemir due to decreased appetite  Family Communication/Anticipated D/C date and plan/Code Status   DVT prophylaxis: heparin Code Status: Full Code.  Family Communication: husband at bedside Disposition Plan:    Medical Consultants:     Subjective:   Not hungry  Objective:    Vitals:   05/14/18 0300 05/14/18 0400 05/14/18 0500 05/14/18 0725  BP: (!) 126/56 (!) 127/48 (!) 117/57   Pulse: 96 97 96   Resp: 11 12 15    Temp:    98.7 F (37.1 C)  TempSrc:    Oral  SpO2: 95% 94% 94%    No intake or output data in the 24 hours ending 05/14/18 1015 There were no vitals filed for this visit.  Exam: In bed, NAD-- picking at food on plate rrr No increased work of breathing No LE EDEMA   Data Reviewed:   I have personally reviewed following labs and imaging studies:  Labs: Labs show the following:   Basic Metabolic Panel: Recent Labs  Lab 05/13/18 1948 05/14/18 0250  NA 139 140  K 3.5 2.8*  CL 103 106  CO2 21* 24  GLUCOSE 153* 106*  BUN 25* 27*  CREATININE 2.41* 2.28*  CALCIUM 11.2* 9.6   GFR CrCl cannot be calculated (Unknown ideal weight.). Liver Function Tests: Recent Labs  Lab 05/13/18 1948  AST 41  ALT 23  ALKPHOS 34*  BILITOT 0.9  PROT 7.2  ALBUMIN 4.1   No results for input(s): LIPASE, AMYLASE in the last 168 hours. No results for input(s):  AMMONIA in the last 168 hours. Coagulation profile No results for input(s): INR, PROTIME in the last 168 hours.  CBC: Recent Labs  Lab 05/13/18 1948 05/14/18 0250  WBC 9.4 6.0  NEUTROABS  --  3.0  HGB 12.8 9.5*  HCT 40.1 29.8*  MCV 90.7 89.8  PLT 405* 302   Cardiac Enzymes: Recent Labs  Lab 05/14/18 0250  CKTOTAL 107   BNP (last 3 results) No results for input(s): PROBNP in the last 8760 hours. CBG: Recent Labs  Lab 05/13/18 1928 05/14/18 0750  GLUCAP 151*  119*   D-Dimer: No results for input(s): DDIMER in the last 72 hours. Hgb A1c: No results for input(s): HGBA1C in the last 72 hours. Lipid Profile: No results for input(s): CHOL, HDL, LDLCALC, TRIG, CHOLHDL, LDLDIRECT in the last 72 hours. Thyroid function studies: Recent Labs    05/14/18 0250  TSH 4.771*   Anemia work up: Recent Labs    05/14/18 0250  VITAMINB12 1,466*   Sepsis Labs: Recent Labs  Lab 05/13/18 1948 05/14/18 0250  WBC 9.4 6.0    Microbiology No results found for this or any previous visit (from the past 240 hour(s)).  Procedures and diagnostic studies:  No results found.  Medications:   . aspirin  81 mg Oral Daily  . atorvastatin  80 mg Oral q1800  . donepezil  10 mg Oral QHS  . fenofibrate  160 mg Oral Daily  . folic acid  1 mg Oral Daily  . heparin  5,000 Units Subcutaneous Q8H  . insulin aspart  0-5 Units Subcutaneous QHS  . insulin aspart  0-9 Units Subcutaneous TID WC  . levothyroxine  100 mcg Oral QAC breakfast  . memantine  10 mg Oral BID  . metoCLOPramide  10 mg Oral TID AC  . metoprolol succinate  50 mg Oral Daily  . multivitamin with minerals  1 tablet Oral Daily  . pantoprazole  40 mg Oral Daily  . risperiDONE  0.5 mg Oral Daily  . risperiDONE  1 mg Oral QHS   Continuous Infusions: . cefTRIAXone (ROCEPHIN)  IV       LOS: 0 days   Geradine Girt  [facility]   *Please refer to [email or web address], password [email or web address] to get updated schedule on who will round on this patient, as hospitalists switch teams weekly. If 7PM-7AM, please contact night-coverage at [email or web address], password [email or web address] for any overnight needs.  05/14/2018, 10:15 AM

## 2018-05-15 DIAGNOSIS — G300 Alzheimer's disease with early onset: Secondary | ICD-10-CM

## 2018-05-15 DIAGNOSIS — R4182 Altered mental status, unspecified: Secondary | ICD-10-CM

## 2018-05-15 LAB — FOLATE RBC
FOLATE, RBC: 1964 ng/mL (ref >498)
Folate, Hemolysate: 542.1 ng/mL
HEMATOCRIT: 27.6 % — AB (ref 34.0–46.6)

## 2018-05-15 LAB — BASIC METABOLIC PANEL
ANION GAP: 7 (ref 5–15)
BUN: 20 mg/dL (ref 8–23)
CO2: 24 mmol/L (ref 22–32)
Calcium: 9.5 mg/dL (ref 8.9–10.3)
Chloride: 104 mmol/L (ref 98–111)
Creatinine, Ser: 1.61 mg/dL — ABNORMAL HIGH (ref 0.44–1.00)
GFR calc Af Amer: 37 mL/min — ABNORMAL LOW (ref >60)
GFR, EST NON AFRICAN AMERICAN: 32 mL/min — AB (ref >60)
GLUCOSE: 185 mg/dL — AB (ref 70–99)
Potassium: 3.6 mmol/L (ref 3.5–5.1)
Sodium: 135 mmol/L (ref 135–145)

## 2018-05-15 LAB — VITAMIN B12: Vitamin B-12: 2070 pg/mL — ABNORMAL HIGH (ref 180–914)

## 2018-05-15 LAB — CBC
HCT: 29.8 % — ABNORMAL LOW (ref 36.0–46.0)
Hemoglobin: 9.4 g/dL — ABNORMAL LOW (ref 12.0–15.0)
MCH: 28.3 pg (ref 26.0–34.0)
MCHC: 31.5 g/dL (ref 30.0–36.0)
MCV: 89.8 fL (ref 80.0–100.0)
Platelets: 306 10*3/uL (ref 150–400)
RBC: 3.32 MIL/uL — AB (ref 3.87–5.11)
RDW: 12.9 % (ref 11.5–15.5)
WBC: 5.1 10*3/uL (ref 4.0–10.5)
nRBC: 0 % (ref 0.0–0.2)

## 2018-05-15 LAB — RETICULOCYTES
Immature Retic Fract: 17.8 % — ABNORMAL HIGH (ref 2.3–15.9)
RBC.: 3.47 MIL/uL — ABNORMAL LOW (ref 3.87–5.11)
RETIC CT PCT: 2.3 % (ref 0.4–3.1)
Retic Count, Absolute: 78.8 10*3/uL (ref 19.0–186.0)

## 2018-05-15 LAB — URINE CULTURE: Culture: >=100000 — AB

## 2018-05-15 LAB — GLUCOSE, CAPILLARY
Glucose-Capillary: 148 mg/dL — ABNORMAL HIGH (ref 70–99)
Glucose-Capillary: 169 mg/dL — ABNORMAL HIGH (ref 70–99)
Glucose-Capillary: 204 mg/dL — ABNORMAL HIGH (ref 70–99)
Glucose-Capillary: 94 mg/dL (ref 70–99)

## 2018-05-15 LAB — FOLATE: Folate: 31.5 ng/mL (ref >5.9)

## 2018-05-15 LAB — IRON AND TIBC
Iron: 46 ug/dL (ref 28–170)
SATURATION RATIOS: 18 % (ref 10.4–31.8)
TIBC: 260 ug/dL (ref 250–450)
UIBC: 214 ug/dL

## 2018-05-15 LAB — FERRITIN: Ferritin: 78 ng/mL (ref 11–307)

## 2018-05-15 MED ORDER — SODIUM CHLORIDE 0.9 % IV SOLN
INTRAVENOUS | Status: DC
  Administered 2018-05-15 – 2018-05-17 (×3): via INTRAVENOUS

## 2018-05-15 MED ORDER — MEGESTROL ACETATE 400 MG/10ML PO SUSP
400.0000 mg | Freq: Two times a day (BID) | ORAL | Status: DC
  Administered 2018-05-15: 400 mg via ORAL
  Filled 2018-05-15: qty 10

## 2018-05-15 MED ORDER — MEGESTROL ACETATE 400 MG/10ML PO SUSP
400.0000 mg | Freq: Every day | ORAL | Status: DC
  Administered 2018-05-16 – 2018-05-17 (×2): 400 mg via ORAL
  Filled 2018-05-15 (×2): qty 10

## 2018-05-15 NOTE — Progress Notes (Signed)
[facility]                                                                              Patient Demographics  Casey Jennings, is a 67 y.o. female, DOB - 10-12-1950, SVX:[id number]  Admit date - 05/13/2018   Admitting Physician [name], MD  Outpatient Primary MD for the patient is Reynold Bowen, MD  Outpatient specialists:   LOS - 1  days   Medical records reviewed and are as summarized below:    Chief Complaint  Patient presents with  . Altered Mental Status       Brief summary   Casey Jennings is an 67 y.o. female with medical history significant fordementia with behavioral disturbance, rheumatoid arthritis, insulin-dependent diabetes mellitus, chronic kidney disease, hypertension, and hypothyroidism, now presenting to the emergency department for evaluation of anorexia, generalized weakness, nausea, suprapubic pain, and dysuria. Patient is accompanied by her husband who assist with the history. She has not been eating much of anything over the past 6 or 7 days per report of her husband, and has not been taking her medications. She denies abdominal pain per se, but does report some suprapubic discomfort and nausea.    Assessment & Plan    Principal Problem:   Acute lower UTI -Continue IV Rocephin, follow urine culture and sensitivities -Patient in the process of being set up for pelvic floor PT by [facility]  Active Problems: Failure to thrive, generalized weakness, anorexia, AKI, has underlying history of dementia -Suspect secondary to dehydration and UTI -Continue IV fluids, follow cultures, continue IV Rocephin -Obtain nutrition consult -Placed on Megace for appetite stimulation, discussed the risk of clots. -Patient had colonoscopy in 11/2017, polyps removed, benign, recommended repeat colonoscopy in 5 years  Dementia -Patient has been following Dr.Yan with [facility], had recommended Aricept, Namenda, risperidone.  Per  neurology, patient has mild cognitive impairment with visual hallucinations, mild parkinsonian features, right sided rigidity more than left possibly central nervous system degenerative disorder, possibly Lewy body dementia -Vitamin B12 elevated, folate, iron panel normal, CK normal -TSH 4.7 -Per patient's husband at the bedside, she has been declining in the last 1 year MRI 11/2016 showed chronic microvascular ischemia without acute intracranial abnormality, progression of atrophy compared to 2014.    Diabetes mellitus type 2 with complications (HCC) -Continue sliding scale insulin  Anemia of chronic disease Anemia panel consistent with anemia of chronic disease, currently stable   Hypothyroidism TSH 4.7, continue Synthroid  CKD stage IV Seems to have worsened since 2015, creatinine 2.4 at the time of admission, improving with IV fluids to 1.6  Code Status: Full code DVT Prophylaxis: Heparin subcu Family Communication: Discussed in detail with the patient, all imaging results, lab results explained to the patient and husband at the bedside   Disposition Plan: Possible DC home in a.m. if improving  Time Spent in minutes   35 minutes  Procedures:  None  Consultants:   None  Antimicrobials:      Medications  Scheduled Meds: . aspirin  81 mg Oral Daily  . atorvastatin  80 mg Oral q1800  . donepezil  10  mg Oral QHS  . fenofibrate  160 mg Oral Daily  . folic acid  1 mg Oral Daily  . heparin  5,000 Units Subcutaneous Q8H  . insulin aspart  0-5 Units Subcutaneous QHS  . insulin aspart  0-9 Units Subcutaneous TID WC  . levothyroxine  100 mcg Oral QAC breakfast  . megestrol  400 mg Oral BID  . memantine  10 mg Oral BID  . metoCLOPramide  10 mg Oral TID AC  . metoprolol succinate  50 mg Oral Daily  . multivitamin with minerals  1 tablet Oral Daily  . pantoprazole  40 mg Oral Daily  . risperiDONE  0.5 mg Oral Daily  . risperiDONE  1 mg Oral QHS   Continuous  Infusions: . sodium chloride    . sodium chloride 100 mL/hr at 05/15/18 0644  . cefTRIAXone (ROCEPHIN)  IV 1 g (05/14/18 2249)   PRN Meds:.sodium chloride, acetaminophen **OR** acetaminophen, ondansetron **OR** ondansetron (ZOFRAN) IV, senna-docusate   Antibiotics   Anti-infectives (From admission, onward)   Start     Dose/Rate Route Frequency Ordered Stop   05/14/18 2200  cefTRIAXone (ROCEPHIN) 1 g in sodium chloride 0.9 % 100 mL IVPB     1 g 200 mL/hr over 30 Minutes Intravenous Every 24 hours 05/13/18 2301     05/13/18 2230  cefTRIAXone (ROCEPHIN) 1 g in sodium chloride 0.9 % 100 mL IVPB     1 g 200 mL/hr over 30 Minutes Intravenous  Once 05/13/18 2216 05/14/18 0042        Subjective:   Casey Jennings was seen and examined today.  Somewhat confused, oriented to person but not to time and place.  Her husband at the bedside, at baseline she is oriented, ambulates on her own.  Patient denies dizziness, chest pain, shortness of breath, abdominal pain, N/V/D/C. No acute events overnight.    Objective:   Vitals:   05/14/18 1851 05/14/18 2146 05/15/18 0542 05/15/18 0737  BP: (!) 148/61 (!) 156/43 (!) 141/46 (!) 159/68  Pulse: 63 81 64 78  Resp: 18 18 18 18   Temp: 98.1 F (36.7 C) 98.5 F (36.9 C) 98.4 F (36.9 C) (!) 97.4 F (36.3 C)  TempSrc: Oral Oral Oral Oral  SpO2: 98% 98% 97% 96%    Intake/Output Summary (Last 24 hours) at 05/15/2018 1211 Last data filed at 05/15/2018 0900 Gross per 24 hour  Intake 760.06 ml  Output 300 ml  Net 460.06 ml     Wt Readings from Last 3 Encounters:  10/31/17 86.2 kg  05/02/17 81.2 kg  02/25/17 79.2 kg     Exam  General: Confused, oriented x1, NAD  Eyes:   HEENT:  Atraumatic, normocephalic, normal oropharynx  Cardiovascular: S1 S2 auscultated,  Regular rate and rhythm.  Respiratory: Clear to auscultation bilaterally, no wheezing, rales or rhonchi  Gastrointestinal: Soft, nontender, nondistended, + bowel sounds  Ext:  trace pedal edema bilaterally  Neuro: no FND's, resting tremor involving bilateral upper extremities  Musculoskeletal: No digital cyanosis, clubbing  Skin: No rashes  Psych: confused, oriented to self and person, otherwise confused to time and place   Data Reviewed:  I have personally reviewed following labs and imaging studies  Micro Results Recent Results (from the past 240 hour(s))  Urine culture     Status: Abnormal   Collection Time: 05/13/18  9:16 PM  Result Value Ref Range Status   Specimen Description URINE, RANDOM  Final   Special Requests   Final  NONE Performed at [facility], [place] [address], [place], [place] [address]    Culture (A)  Final    >=100,000 COLONIES/mL MULTIPLE SPECIES PRESENT, SUGGEST RECOLLECTION   Report Status 05/15/2018 FINAL  Final    Radiology Reports No results found.  Lab Data:  CBC: Recent Labs  Lab 05/13/18 1948 05/14/18 0250 05/15/18 0446  WBC 9.4 6.0 5.1  NEUTROABS  --  3.0  --   HGB 12.8 9.5* 9.4*  HCT 40.1 29.8* 29.8*  MCV 90.7 89.8 89.8  PLT 405* 302 701   Basic Metabolic Panel: Recent Labs  Lab 05/13/18 1948 05/14/18 0250 05/15/18 0446  NA 139 140 135  K 3.5 2.8* 3.6  CL 103 106 104  CO2 21* 24 24  GLUCOSE 153* 106* 185*  BUN 25* 27* 20  CREATININE 2.41* 2.28* 1.61*  CALCIUM 11.2* 9.6 9.5  MG  --  1.2*  --    GFR: CrCl cannot be calculated (Unknown ideal weight.). Liver Function Tests: Recent Labs  Lab 05/13/18 1948  AST 41  ALT 23  ALKPHOS 34*  BILITOT 0.9  PROT 7.2  ALBUMIN 4.1   No results for input(s): LIPASE, AMYLASE in the last 168 hours. No results for input(s): AMMONIA in the last 168 hours. Coagulation Profile: No results for input(s): INR, PROTIME in the last 168 hours. Cardiac Enzymes: Recent Labs  Lab 05/14/18 0250  CKTOTAL 107   BNP (last 3 results) No results for input(s): PROBNP in the last 8760 hours. HbA1C: No results for input(s): HGBA1C in the last 72  hours. CBG: Recent Labs  Lab 05/14/18 1209 05/14/18 1730 05/14/18 2146 05/15/18 0738 05/15/18 1144  GLUCAP 149* 152* 163* 148* 169*   Lipid Profile: No results for input(s): CHOL, HDL, LDLCALC, TRIG, CHOLHDL, LDLDIRECT in the last 72 hours. Thyroid Function Tests: Recent Labs    05/14/18 0250  TSH 4.771*   Anemia Panel: Recent Labs    05/14/18 0250 05/15/18 0748  VITAMINB12 1,466* 2,070*  FOLATE  --  31.5  FERRITIN  --  78  TIBC  --  260  IRON  --  46  RETICCTPCT  --  2.3   Urine analysis:    Component Value Date/Time   COLORURINE AMBER (A) 05/13/2018 2104   APPEARANCEUR CLOUDY (A) 05/13/2018 2104   LABSPEC 1.021 05/13/2018 2104   PHURINE 5.0 05/13/2018 2104   GLUCOSEU 50 (A) 05/13/2018 2104   HGBUR LARGE (A) 05/13/2018 2104   BILIRUBINUR SMALL (A) 05/13/2018 2104   KETONESUR 5 (A) 05/13/2018 2104   PROTEINUR 100 (A) 05/13/2018 2104   UROBILINOGEN 1.0 04/10/2014 1831   NITRITE NEGATIVE 05/13/2018 2104   LEUKOCYTESUR LARGE (A) 05/13/2018 2104     Avamarie Crossley M.D. [facility] 05/15/2018, 12:11 PM  Pager: [phone] Between 7am to 7pm - call Pager - [phone]  After 7pm go to [email or web address] - password [email or web address]  Call night coverage person covering after 7pm

## 2018-05-15 NOTE — Evaluation (Signed)
Physical Therapy Evaluation Patient Details Name: Casey Jennings MRN: [id number] DOB: 03/23/1951 Today's Date: 05/15/2018   History of Present Illness  67 y.o. female with medical history significant for dementia with behavioral disturbance, rheumatoid arthritis, insulin-dependent diabetes mellitus, chronic kidney disease, hypertension, and hypothyroidism, now presenting to the emergency department for evaluation of anorexia, generalized weakness, nausea, suprapubic pain, and dysuria.  Dx of UTI.  Clinical Impression  Pt admitted with above diagnosis. Pt currently with functional limitations due to the deficits listed below (see PT Problem List). Pt ambulated 180' without an assistive device with min/guard assist for safety/balance. Pt's baseline and level of available assistance at home are unknown as pt is only oriented to herself and could not provide hx. Her spouse was in room but sleeping very soundly and did not awaken to verbal stimulii.  At present, she requires supervision for mobility 2* confusion. Pt will benefit from skilled PT to increase their independence and safety with mobility to allow discharge to the venue listed below.      Follow Up Recommendations No PT follow up; supervision for mobility    Equipment Recommendations  None recommended by PT    Recommendations for Other Services       Precautions / Restrictions Precautions Precautions: Fall Restrictions Weight Bearing Restrictions: No      Mobility  Bed Mobility Overal bed mobility: Modified Independent             General bed mobility comments: HOB up 30*, used rail, increased time  Transfers Overall transfer level: Needs assistance   Transfers: Sit to/from Stand Sit to Stand: Min guard         General transfer comment: min/guard for safety 2* pt's confusion, no physical assist needed, no loss of balance  Ambulation/Gait Ambulation/Gait assistance: Min guard Gait Distance (Feet): 180  Feet Assistive device: None Gait Pattern/deviations: Decreased stride length;Drifts right/left Gait velocity: WFL   General Gait Details: some drifting mostly to left but no loss of balance  Stairs            Wheelchair Mobility    Modified Rankin (Stroke Patients Only)       Balance Overall balance assessment: Needs assistance   Sitting balance-Leahy Scale: Good       Standing balance-Leahy Scale: Good                               Pertinent Vitals/Pain Pain Assessment: Faces Faces Pain Scale: No hurt    Home Living Family/patient expects to be discharged to:: Private residence Living Arrangements: Spouse/significant other               Additional Comments: unknown -pt has dementia, poor historian    Prior Function           Comments: pt denies use of assistive device, however pt is poor historian 2* dementia, her husband was in room sleeping and did not arouse to verbal stimuli     Hand Dominance        Extremity/Trunk Assessment   Upper Extremity Assessment Upper Extremity Assessment: Overall WFL for tasks assessed    Lower Extremity Assessment Lower Extremity Assessment: Overall WFL for tasks assessed    Cervical / Trunk Assessment Cervical / Trunk Assessment: Normal  Communication      Cognition Arousal/Alertness: Awake/alert Behavior During Therapy: WFL for tasks assessed/performed;Flat affect Overall Cognitive Status: No family/caregiver present to determine baseline cognitive functioning  General Comments: h/o dementia per chart, pt oriented to self only (not oriented to location, situation, month/year), follows 1 step commands inconsistently      General Comments      Exercises     Assessment/Plan    PT Assessment Patient needs continued PT services  PT Problem List Decreased mobility;Decreased cognition;Decreased balance       PT Treatment Interventions  Gait training;Therapeutic exercise    PT Goals (Current goals can be found in the Care Plan section)  Acute Rehab PT Goals Patient Stated Goal: none stated -pt has dementia  PT Goal Formulation: Patient unable to participate in goal setting Time For Goal Achievement: 05/29/18 Potential to Achieve Goals: Good    Frequency Min 3X/week   Barriers to discharge        Co-evaluation               AM-PAC PT "6 Clicks" Daily Activity  Outcome Measure Difficulty turning over in bed (including adjusting bedclothes, sheets and blankets)?: A Little Difficulty moving from lying on back to sitting on the side of the bed? : A Little Difficulty sitting down on and standing up from a chair with arms (e.g., wheelchair, bedside commode, etc,.)?: A Little Help needed moving to and from a bed to chair (including a wheelchair)?: A Little Help needed walking in hospital room?: A Little Help needed climbing 3-5 steps with a railing? : A Little 6 Click Score: 18    End of Session Equipment Utilized During Treatment: Gait belt Activity Tolerance: Patient tolerated treatment well Patient left: in chair;with chair alarm set;with call bell/phone within reach Nurse Communication: Mobility status PT Visit Diagnosis: Unsteadiness on feet (R26.81);Difficulty in walking, not elsewhere classified (R26.2)    Time: 0938-1829 PT Time Calculation (min) (ACUTE ONLY): 19 min   Charges:   PT Evaluation $PT Eval Low Complexity: 1 Low          Blondell Reveal Kistler PT 05/15/2018  Acute Rehabilitation Services Pager [phone] Office [phone]

## 2018-05-16 DIAGNOSIS — G308 Other Alzheimer's disease: Secondary | ICD-10-CM

## 2018-05-16 LAB — GLUCOSE, CAPILLARY
GLUCOSE-CAPILLARY: 102 mg/dL — AB (ref 70–99)
GLUCOSE-CAPILLARY: 116 mg/dL — AB (ref 70–99)
GLUCOSE-CAPILLARY: 101 mg/dL — AB (ref 70–99)
Glucose-Capillary: 105 mg/dL — ABNORMAL HIGH (ref 70–99)

## 2018-05-16 LAB — BASIC METABOLIC PANEL
Anion gap: 6 (ref 5–15)
BUN: 10 mg/dL (ref 8–23)
CHLORIDE: 112 mmol/L — AB (ref 98–111)
CO2: 25 mmol/L (ref 22–32)
CREATININE: 1.39 mg/dL — AB (ref 0.44–1.00)
Calcium: 9.3 mg/dL (ref 8.9–10.3)
GFR, EST AFRICAN AMERICAN: 44 mL/min — AB (ref >60)
GFR, EST NON AFRICAN AMERICAN: 38 mL/min — AB (ref >60)
Glucose, Bld: 116 mg/dL — ABNORMAL HIGH (ref 70–99)
POTASSIUM: 3.4 mmol/L — AB (ref 3.5–5.1)
SODIUM: 143 mmol/L (ref 135–145)

## 2018-05-16 MED ORDER — ENSURE ENLIVE PO LIQD
237.0000 mL | Freq: Three times a day (TID) | ORAL | Status: DC
  Administered 2018-05-16: 237 mL via ORAL

## 2018-05-16 NOTE — Progress Notes (Signed)
[facility]                                                                              Patient Demographics  Casey Jennings, is a 67 y.o. female, DOB - May 27, 1951, WRU:[id number]  Admit date - 05/13/2018   Admitting Physician [name], MD  Outpatient Primary MD for the patient is Reynold Bowen, MD  Outpatient specialists:   LOS - 2  days   Medical records reviewed and are as summarized below:    Chief Complaint  Patient presents with  . Altered Mental Status       Brief summary   Casey Jennings is an 67 y.o. female with medical history significant fordementia with behavioral disturbance, rheumatoid arthritis, insulin-dependent diabetes mellitus, chronic kidney disease, hypertension, and hypothyroidism, now presenting to the emergency department for evaluation of anorexia, generalized weakness, nausea, suprapubic pain, and dysuria. Patient is accompanied by her husband who assist with the history. She has not been eating much of anything over the past 6 or 7 days per report of her husband, and has not been taking her medications. She denies abdominal pain per se, but does report some suprapubic discomfort and nausea.    Assessment & Plan    Principal Problem:   Acute lower UTI -Continue IV Rocephin -Patient in the process of being set up for pelvic floor PT by [facility] - Urine culture inconclusive but more than 100000 colonies  Active Problems: Failure to thrive, generalized weakness, anorexia, AKI, has underlying history of dementia -Suspect secondary to dehydration and UTI -Continue IV fluids, follow cultures, continue IV Rocephin -Placed on Megace for appetite stimulation, discussed the risk of clots. -Patient had colonoscopy in 11/2017, polyps removed, benign, recommended repeat colonoscopy in 5 years -Nutrition consult obtained, appreciate recommendations.  Dementia negatively impacting appetite, intake and hydration as well as the  meds. -Too sleepy today, per RN, no sedating meds overnight, did wake up for the medications.  Husband at the bedside.  Dementia -Patient has been following Dr.Yan with [facility], had recommended Aricept, Namenda, risperidone.  Per neurology, patient has mild cognitive impairment with visual hallucinations, mild parkinsonian features, right sided rigidity more than left possibly central nervous system degenerative disorder, possibly Lewy body dementia -Vitamin B12 elevated, folate, iron panel normal, CK normal -TSH 4.7 - she has been declining in the last 1 year per husband - MRI 11/2016 showed chronic microvascular ischemia without acute intracranial abnormality, progression of atrophy compared to 2014.    Diabetes mellitus type 2 with complications (HCC) -Continue sliding scale insulin  Anemia of chronic disease Anemia panel consistent with anemia of chronic disease, currently stable   Hypothyroidism TSH 4.7, continue Synthroid  CKD stage IV Seems to have worsened since 2015, creatinine 2.4 at the time of admission,  Improving, 1.3 today  Code Status: Full code DVT Prophylaxis: Heparin subcu Family Communication: Discussed in detail with the patient, all imaging results, lab results explained to the patient and husband at the bedside   Disposition Plan: Too sleepy, yesterday was up whole day.   Time Spent in minutes   25 minutes  Procedures:  None  Consultants:   None  Antimicrobials:      Medications  Scheduled Meds: . aspirin  81 mg Oral Daily  . atorvastatin  80 mg Oral q1800  . donepezil  10 mg Oral QHS  . feeding supplement (ENSURE ENLIVE)  237 mL Oral TID BM  . fenofibrate  160 mg Oral Daily  . folic acid  1 mg Oral Daily  . heparin  5,000 Units Subcutaneous Q8H  . insulin aspart  0-5 Units Subcutaneous QHS  . insulin aspart  0-9 Units Subcutaneous TID WC  . levothyroxine  100 mcg Oral QAC breakfast  . megestrol  400 mg Oral Daily  . memantine   10 mg Oral BID  . metoCLOPramide  10 mg Oral TID AC  . metoprolol succinate  50 mg Oral Daily  . multivitamin with minerals  1 tablet Oral Daily  . pantoprazole  40 mg Oral Daily  . risperiDONE  0.5 mg Oral Daily  . risperiDONE  1 mg Oral QHS   Continuous Infusions: . sodium chloride    . sodium chloride 100 mL/hr at 05/16/18 0234  . cefTRIAXone (ROCEPHIN)  IV 1 g (05/15/18 2140)   PRN Meds:.sodium chloride, acetaminophen **OR** acetaminophen, ondansetron **OR** ondansetron (ZOFRAN) IV, senna-docusate   Antibiotics   Anti-infectives (From admission, onward)   Start     Dose/Rate Route Frequency Ordered Stop   05/14/18 2200  cefTRIAXone (ROCEPHIN) 1 g in sodium chloride 0.9 % 100 mL IVPB     1 g 200 mL/hr over 30 Minutes Intravenous Every 24 hours 05/13/18 2301     05/13/18 2230  cefTRIAXone (ROCEPHIN) 1 g in sodium chloride 0.9 % 100 mL IVPB     1 g 200 mL/hr over 30 Minutes Intravenous  Once 05/13/18 2216 05/14/18 0042        Subjective:   Casey Jennings was seen and examined today.  Snoring, sleepy, examined twice once in the morning and once around noon.  Per RN, patient did wake up for the medications and to go to the bathroom.  Husband at the bedside.  She was up a whole day, sitting in the chair yesterday, he feels that she is more exhausted today.  At baseline she is oriented and ambulates however has been declining over the last 1 year.    Objective:   Vitals:   05/15/18 2139 05/16/18 0634 05/16/18 0940 05/16/18 1022  BP: (!) 147/72 (!) 110/53 (!) 151/70   Pulse: 73 69 76   Resp: 19 14 18    Temp: 99 F (37.2 C) 98.8 F (37.1 C) 98.1 F (36.7 C)   TempSrc: Oral Oral Oral   SpO2: 97% 99% 97%   Weight:  77.9 kg    Height:    5\' 4"  (1.626 m)    Intake/Output Summary (Last 24 hours) at 05/16/2018 1408 Last data filed at 05/16/2018 1300 Gross per 24 hour  Intake 2444.4 ml  Output 500 ml  Net 1944.4 ml     Wt Readings from Last 3 Encounters:  05/16/18  77.9 kg  10/31/17 86.2 kg  05/02/17 81.2 kg     Exam   General:  sleepy and snoring  Eyes:   HEENT:    Cardiovascular: S1 S2 auscultated,RRR. No pedal edema b/l  Respiratory: Clear to auscultation bilaterally, no wheezing, rales or rhonchi  Gastrointestinal: Soft, nontender, nondistended, + bowel sounds  Ext: no pedal edema bilaterally  Neuro:  unable to assess  Musculoskeletal: No digital cyanosis, clubbing  Skin: No rashes  Psych:      Data Reviewed:  I have personally reviewed following labs and imaging studies  Micro Results Recent Results (from the past 240 hour(s))  Urine culture     Status: Abnormal   Collection Time: 05/13/18  9:16 PM  Result Value Ref Range Status   Specimen Description URINE, RANDOM  Final   Special Requests   Final    NONE Performed at [facility], [address], [place], [place] [address]    Culture (A)  Final    >=100,000 COLONIES/mL MULTIPLE SPECIES PRESENT, SUGGEST RECOLLECTION   Report Status 05/15/2018 FINAL  Final    Radiology Reports No results found.  Lab Data:  CBC: Recent Labs  Lab 05/13/18 1948 05/14/18 0250 05/14/18 0258 05/15/18 0446  WBC 9.4 6.0  --  5.1  NEUTROABS  --  3.0  --   --   HGB 12.8 9.5*  --  9.4*  HCT 40.1 29.8* 27.6* 29.8*  MCV 90.7 89.8  --  89.8  PLT 405* 302  --  893   Basic Metabolic Panel: Recent Labs  Lab 05/13/18 1948 05/14/18 0250 05/15/18 0446 05/16/18 0614  NA 139 140 135 143  K 3.5 2.8* 3.6 3.4*  CL 103 106 104 112*  CO2 21* 24 24 25   GLUCOSE 153* 106* 185* 116*  BUN 25* 27* 20 10  CREATININE 2.41* 2.28* 1.61* 1.39*  CALCIUM 11.2* 9.6 9.5 9.3  MG  --  1.2*  --   --    GFR: Estimated Creatinine Clearance: 39.1 mL/min (A) (by C-G formula based on SCr of 1.39 mg/dL (H)). Liver Function Tests: Recent Labs  Lab 05/13/18 1948  AST 41  ALT 23  ALKPHOS 34*  BILITOT 0.9  PROT 7.2  ALBUMIN 4.1   No results for input(s): LIPASE, AMYLASE in the last 168  hours. No results for input(s): AMMONIA in the last 168 hours. Coagulation Profile: No results for input(s): INR, PROTIME in the last 168 hours. Cardiac Enzymes: Recent Labs  Lab 05/14/18 0250  CKTOTAL 107   BNP (last 3 results) No results for input(s): PROBNP in the last 8760 hours. HbA1C: No results for input(s): HGBA1C in the last 72 hours. CBG: Recent Labs  Lab 05/15/18 1144 05/15/18 1612 05/15/18 2145 05/16/18 0749 05/16/18 1206  GLUCAP 169* 204* 94 102* 116*   Lipid Profile: No results for input(s): CHOL, HDL, LDLCALC, TRIG, CHOLHDL, LDLDIRECT in the last 72 hours. Thyroid Function Tests: Recent Labs    05/14/18 0250  TSH 4.771*   Anemia Panel: Recent Labs    05/14/18 0250 05/15/18 0748  VITAMINB12 1,466* 2,070*  FOLATE  --  31.5  FERRITIN  --  78  TIBC  --  260  IRON  --  46  RETICCTPCT  --  2.3   Urine analysis:    Component Value Date/Time   COLORURINE AMBER (A) 05/13/2018 2104   APPEARANCEUR CLOUDY (A) 05/13/2018 2104   LABSPEC 1.021 05/13/2018 2104   PHURINE 5.0 05/13/2018 2104   GLUCOSEU 50 (A) 05/13/2018 2104   HGBUR LARGE (A) 05/13/2018 2104   BILIRUBINUR SMALL (A) 05/13/2018 2104   KETONESUR 5 (A) 05/13/2018 2104   PROTEINUR 100 (A) 05/13/2018 2104   UROBILINOGEN 1.0 04/10/2014 1831   NITRITE NEGATIVE 05/13/2018 2104   LEUKOCYTESUR LARGE (A) 05/13/2018 2104     Kaelei Wheeler M.D. [facility] 05/16/2018, 2:08 PM  Pager: [phone] Between 7am to 7pm - call Pager - [phone]  After 7pm go  to [email or web address] - password [email or web address]  Call night coverage person covering after 7pm

## 2018-05-16 NOTE — Care Management Note (Signed)
Case Management Note  Patient Details  Name: Casey Jennings MRN: [id number] Date of Birth: April 27, 1951  Subjective/Objective:    Pt admitted with AMS                Action/Plan:   PTA from home with husband, per husband pt is independent with mobility.  Pt has PCP and husband denied barriers with paying for medications as prescribed   Expected Discharge Date:  05/19/18               Expected Discharge Plan:  Home/Self Care  In-House Referral:     Discharge planning Services  CM Consult  Post Acute Care Choice:    Choice offered to:     DME Arranged:    DME Agency:     HH Arranged:    [place] Agency:     Status of Service:     If discussed at [employer], dates discussed:    Additional Comments:  Maryclare Labrador, RN 05/16/2018, 5:15 PM

## 2018-05-16 NOTE — Care Management Important Message (Signed)
Important Message  Patient Details  Name: Casey Jennings MRN: [id number] Date of Birth: June 18, 1951   Medicare Important Message Given:  Yes    [name] 05/16/2018, 2:50 PM

## 2018-05-16 NOTE — Progress Notes (Signed)
Initial Nutrition Assessment  DOCUMENTATION CODES:   Not applicable  INTERVENTION:   - Add Ensure Enlive TID (each provides 350 kcal and 20 g protein) - Continue appetite stimulant; monitor for improvements in intake - Continue to monitor vitamin B12  NUTRITION DIAGNOSIS:   Inadequate oral intake related to poor appetite, lethargy/confusion as evidenced by per patient/family report, meal completion < 50%.  GOAL:   Patient will meet greater than or equal to 90% of their needs  MONITOR:   PO intake, Supplement acceptance, Labs  REASON FOR ASSESSMENT:   Consult Diet education, Assessment of nutrition requirement/status  ASSESSMENT:   67 yo female, admitted with acute lower UTI. PMH significant for dementia, insulin-dependent DM, CKD, HTN, hypothyroidism, GERD, osteoporosis. Presented in ED on 10/22 with anorexia, general weakness, nausea, suprapubic pain, and dysuria.  Labs: potassium 3.4, chloride 112, glucose 116, BUN WNL, Creatinine 1.39, GFR 38/44, vitamin B12 2070 (H), Hgb 9.4, Hct 29.8  Meds: lipitor, fenofibrate, folvite, novolog, levothyroxine, megace, reglan, MVI with minerals, protonix  Pt resting at time of visit; husband at bedside. D/t dementia, husband provided majority of information. Husband reports pt having poor appetite for past 2-3 weeks and estimates she has lost 10#. Describes pt mainly eating lunch and dinner and states pt takes Centrum brand MVI.   Pt will ask for a certain food and then not recognize it as something she wants to eat when she gets it - dementia negatively impacting appetite, intake, and hydration, as well as medication compliance.   Pt began appetite stimulant 10/24; family has yet to see improvements.   Per nsg, pt missed breakfast today and only nibbled at meals yesterday.  Husband reports nausea and vomiting during this admission, but not at home PTA. Denies diarrhea or constipation, or difficulty chewing or swallowing.    Encouraged protein foods at every meal and suggested having pt eat those first if appetite remains poor. Agreeable to Ensure Enlive TID in chocolate.  NUTRITION - FOCUSED PHYSICAL EXAM:   Most Recent Value  Orbital Region  No depletion  Upper Arm Region  No depletion  Thoracic and Lumbar Region  No depletion  Buccal Region  No depletion  Temple Region  No depletion  Clavicle Bone Region  No depletion  Clavicle and Acromion Bone Region  No depletion  Scapular Bone Region  No depletion  Dorsal Hand  No depletion  Patellar Region  No depletion  Anterior Thigh Region  No depletion  Posterior Calf Region  No depletion  Edema (RD Assessment)  None  Hair  Reviewed  Eyes  Reviewed  Mouth  Reviewed  Skin  Reviewed  Nails  Reviewed     Diet Order:  25-100% of last 3 meals, per nsg Diet Order            Diet Heart Room service appropriate? Yes; Fluid consistency: Thin  Diet effective now              EDUCATION NEEDS:  No education needs have been identified at this time  Skin:  Skin Assessment: Reviewed RN Assessment  Last BM:  10/24, type 4  Height: Ht Readings from Last 1 Encounters:  05/16/18 5\' 4"  (1.626 m)    Weight:  Wt Readings from Last 1 Encounters:  05/16/18 77.9 kg    Ideal Body Weight:  54.6 kg  BMI:  Body mass index is 29.48 kg/m.  Estimated Nutritional Needs:   Kcal:  8563 kcal/day(MSJ x 1.2)  Protein:  62-78 g protein/day(0.8 -  1.0 g/kg actual BW)  Fluid:  1 mL/kcal or per MD discretion  Althea Grimmer, MS, RDN, LDN On-call pager: [phone]

## 2018-05-17 LAB — BASIC METABOLIC PANEL
ANION GAP: 9 (ref 5–15)
BUN: 9 mg/dL (ref 8–23)
CALCIUM: 8.9 mg/dL (ref 8.9–10.3)
CO2: 21 mmol/L — AB (ref 22–32)
Chloride: 111 mmol/L (ref 98–111)
Creatinine, Ser: 1.25 mg/dL — ABNORMAL HIGH (ref 0.44–1.00)
GFR calc Af Amer: 50 mL/min — ABNORMAL LOW (ref >60)
GFR calc non Af Amer: 43 mL/min — ABNORMAL LOW (ref >60)
GLUCOSE: 88 mg/dL (ref 70–99)
POTASSIUM: 3.1 mmol/L — AB (ref 3.5–5.1)
Sodium: 141 mmol/L (ref 135–145)

## 2018-05-17 LAB — GLUCOSE, CAPILLARY: GLUCOSE-CAPILLARY: 85 mg/dL (ref 70–99)

## 2018-05-17 MED ORDER — POTASSIUM CHLORIDE CRYS ER 20 MEQ PO TBCR
40.0000 meq | EXTENDED_RELEASE_TABLET | Freq: Once | ORAL | Status: AC
  Administered 2018-05-17: 40 meq via ORAL
  Filled 2018-05-17: qty 2

## 2018-05-17 MED ORDER — MEGESTROL ACETATE 400 MG/10ML PO SUSP: 400.0000 mg | Freq: Every day | ORAL | 3 refills | Status: DC

## 2018-05-17 MED ORDER — CEFUROXIME AXETIL 250 MG PO TABS: 250.0000 mg | ORAL_TABLET | Freq: Two times a day (BID) | ORAL | 0 refills | Status: AC

## 2018-05-17 NOTE — Discharge Summary (Signed)
Physician Discharge Summary   Patient ID: Casey Jennings MRN: [id number] DOB/AGE: May 21, 1951 67 y.o.  Admit date: 05/13/2018 Discharge date: 05/17/2018  Primary Care Physician:  Reynold Bowen, MD   Recommendations for Outpatient Follow-up:  1. Follow up with PCP in 1-2 weeks 2. Patient started on appetite stimulant Megace  Home Health: Home health PT OT Equipment/Devices:   Discharge Condition: stable  CODE STATUS: FULL   Diet recommendation: Heart healthy diet   Discharge Diagnoses:   Failure to thrive, generalized weakness . Alzheimer's dementia with behavioral disturbance ([place]) . Diabetes mellitus type 2 with complications ([place]) . Rheumatoid arthritis ([place]) . Hypertension . 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 Acute lower UTI . Dementia . Hypothyroidism   Consults: None    Allergies:   Allergies  Allergen Reactions  . Codeine     hallucinations     DISCHARGE MEDICATIONS: Allergies as of 05/17/2018      Reactions   Codeine    hallucinations      Medication List    STOP taking these medications   doxycycline 100 MG capsule Commonly known as:  VIBRAMYCIN   insulin detemir 100 UNIT/ML injection Commonly known as:  LEVEMIR   losartan-hydrochlorothiazide 100-12.5 MG tablet Commonly known as:  HYZAAR     TAKE these medications   aspirin 81 MG tablet Take 81 mg by mouth daily.   atorvastatin 80 MG tablet Commonly known as:  LIPITOR Take 80 mg by mouth at bedtime.   CALCIUM 600 + D PO Take 600 mg by mouth daily.   cefUROXime 250 MG tablet Commonly known as:  CEFTIN Take 1 tablet (250 mg total) by mouth 2 (two) times daily with a meal for 3 days.   donepezil 10 MG tablet Commonly known as:  ARICEPT Take 1 tablet (10 mg total) by mouth at bedtime.   FISH OIL PO Take 1,200 mg by mouth daily.   folic acid 1 MG tablet Commonly known as:  FOLVITE Take 1 mg by mouth daily.   levothyroxine 100 MCG tablet Commonly  known as:  SYNTHROID, LEVOTHROID Take 100 mcg by mouth daily before breakfast.   linagliptin 5 MG Tabs tablet Commonly known as:  TRADJENTA Take 5 mg by mouth at bedtime.   megestrol 400 MG/10ML suspension Commonly known as:  MEGACE Take 10 mLs (400 mg total) by mouth daily.   memantine 10 MG tablet Commonly known as:  NAMENDA Take 1 tablet (10 mg total) by mouth 2 (two) times daily.   methotrexate 2.5 MG tablet Commonly known as:  RHEUMATREX Take 20 mg by mouth once a week. On Wednesdays.  Caution:Chemotherapy. Protect from light.   metoCLOPramide 10 MG tablet Commonly known as:  REGLAN Take 10 mg by mouth 3 (three) times daily before meals.   metoprolol succinate 50 MG 24 hr tablet Commonly known as:  TOPROL-XL Take 50 mg by mouth daily. Take with or immediately following a meal.   multivitamin with minerals Tabs tablet Take 1 tablet by mouth daily.   NEXIUM 40 MG capsule Generic drug:  esomeprazole Take 40 mg by mouth daily.   nitroGLYCERIN 0.4 MG SL tablet Commonly known as:  NITROSTAT Place 0.4 mg under the tongue every 5 (five) minutes as needed for chest pain.   risperiDONE 0.5 MG tablet Commonly known as:  RISPERDAL One in the morning, 2 tablets at night What changed:  additional instructions   TRILIPIX 135 MG capsule Generic drug:  Choline Fenofibrate Take 135  mg by mouth daily.   Vitamin D (Ergocalciferol) 50000 units Caps capsule Commonly known as:  DRISDOL Take 50,000 Units by mouth. Tuesdays and Fridays.        Brief H and P: For complete details please refer to admission H and P, but in Casey Jennings an 67 y.o.femalewith medical history significant fordementia with behavioral disturbance, rheumatoid arthritis, insulin-dependent diabetes mellitus, chronic kidney disease, hypertension, and hypothyroidism, now presenting to the emergency department for evaluation of anorexia, generalized weakness, nausea, suprapubic pain, and dysuria.  Patient is accompanied by her husband who assist with the history. She has not been eating much of anything over the past 6 or 7 days per report of her husband, and has not been taking her medications. She denies abdominal pain per se, but does report some suprapubic discomfort and nausea.  Hospital Course:  Acute lower UTI -Patient was placed on IV Rocephin  -Patient in the process of being set up for pelvic floor PT by [facility] - Urine culture inconclusive but more than 100000 colonies, given acute kidney injury, mental status changes, at the time of discharge, transitioned to oral Ceftin for another 3 days.   Failure to thrive, generalized weakness, anorexia, AKI, has underlying history of dementia -Suspect secondary to dehydration and UTI -Patient was placed on IV fluids and IV Rocephin -Placed on Megace for appetite stimulation, discussed the risk of clots. -Patient had colonoscopy in 11/2017, polyps removed, benign, recommended repeat colonoscopy in 5 years -Nutrition consult obtained, appreciate recommendations.  Dementia negatively impacting appetite, intake and hydration as well as the meds. -Patient closer to her baseline, appetite improving per husband  Dementia -Patient has been following Dr.Yan with [facility], had recommended Aricept, Namenda, risperidone.  Per neurology, patient has mild cognitive impairment with visual hallucinations, mild parkinsonian features, right sided rigidity more than left possibly central nervous system degenerative disorder, possibly Lewy body dementia -Vitamin B12 elevated, folate, iron panel normal, CK normal -TSH 4.7 - she has been declining in the last 1 year per husband - MRI 11/2016 showed chronic microvascular ischemia without acute intracranial abnormality, progression of atrophy compared to 2014. -Follow-up with neurology.    Diabetes mellitus type 2 with complications ([place]) -Continue Tradjenta, Levemir insulin held,  as patient CBGs were fairly controlled and stable without long-acting insulin  Anemia of chronic disease Anemia panel consistent with anemia of chronic disease, currently stable  Hypothyroidism TSH 4.7, continue Synthroid  Acute on CKD stage IV Seems to have worsened since 2015, creatinine 2.4 at the time of admission,  Patient was placed on gentle hydration, creatinine improved to 1.2 at the time of discharge  Hypokalemia Replaced   Day of Discharge S: Alert and awake, feeling better, no acute issues overnight.  BP (!) 162/66 (BP Location: Left Arm)   Pulse 82   Temp 97.9 F (36.6 C) (Oral)   Resp 18   Ht 5\' 4"  (1.626 m)   Wt 79.2 kg   SpO2 98%   BMI 29.97 kg/m   Physical Exam: General: Alert and awake oriented to self and place, not in any acute distress. HEENT: anicteric sclera, pupils reactive to light and accommodation CVS: S1-S2 clear no murmur rubs or gallops Chest: clear to auscultation bilaterally, no wheezing rales or rhonchi Abdomen: soft nontender, nondistended, normal bowel sounds Extremities: no cyanosis, clubbing or edema noted bilaterally Neuro: Cranial nerves II-XII intact, no focal neurological deficits   The results of significant diagnostics from this hospitalization (including imaging, microbiology, ancillary and  laboratory) are listed below for reference.      Procedures/Studies:   No results found.    LAB RESULTS: Basic Metabolic Panel: Recent Labs  Lab 05/14/18 0250  05/16/18 0614 05/17/18 0550  NA 140   < > 143 141  K 2.8*   < > 3.4* 3.1*  CL 106   < > 112* 111  CO2 24   < > 25 21*  GLUCOSE 106*   < > 116* 88  BUN 27*   < > 10 9  CREATININE 2.28*   < > 1.39* 1.25*  CALCIUM 9.6   < > 9.3 8.9  MG 1.2*  --   --   --    < > = values in this interval not displayed.   Liver Function Tests: Recent Labs  Lab 05/13/18 1948  AST 41  ALT 23  ALKPHOS 34*  BILITOT 0.9  PROT 7.2  ALBUMIN 4.1   No results for input(s):  LIPASE, AMYLASE in the last 168 hours. No results for input(s): AMMONIA in the last 168 hours. CBC: Recent Labs  Lab 05/14/18 0250 05/14/18 0258 05/15/18 0446  WBC 6.0  --  5.1  NEUTROABS 3.0  --   --   HGB 9.5*  --  9.4*  HCT 29.8* 27.6* 29.8*  MCV 89.8  --  89.8  PLT 302  --  306   Cardiac Enzymes: Recent Labs  Lab 05/14/18 0250  CKTOTAL 107   BNP: Invalid input(s): POCBNP CBG: Recent Labs  Lab 05/16/18 2154 05/17/18 0756  GLUCAP 101* 85      Disposition and Follow-up: Discharge Instructions    Diet - low sodium heart healthy   Complete by:  As directed    Discharge instructions   Complete by:  As directed    It is VERY IMPORTANT that you follow up with a PCP on a regular basis.  Check your blood glucoses before each meal and at bedtime and maintain a log of your readings.  Bring this log with you when you follow up with your PCP so that he or she can adjust your insulin at your follow up visit.   Increase activity slowly   Complete by:  As directed        DISPOSITION: [place], MD. Schedule an appointment as soon as possible for a visit in 2 week(s).   Specialty:  Endocrinology Contact information: [place] [address] [phone]            Time coordinating discharge:  35 minutes  Signed:   Estill Cotta M.D. [facility] 05/17/2018, 12:16 PM Pager: [phone]

## 2018-05-17 NOTE — Progress Notes (Signed)
[facility] N[facility] to be D/C'd Home per MD order.  Discussed prescriptions and follow up appointments with the patient. Prescriptions given to patient, medication list explained in detail. Pt verbalized understanding.  Allergies as of 05/17/2018      Reactions   Codeine    hallucinations      Medication List    STOP taking these medications   doxycycline 100 MG capsule Commonly known as:  VIBRAMYCIN   insulin detemir 100 UNIT/ML injection Commonly known as:  LEVEMIR   losartan-hydrochlorothiazide 100-12.5 MG tablet Commonly known as:  HYZAAR     TAKE these medications   aspirin 81 MG tablet Take 81 mg by mouth daily.   atorvastatin 80 MG tablet Commonly known as:  LIPITOR Take 80 mg by mouth at bedtime.   CALCIUM 600 + D PO Take 600 mg by mouth daily.   cefUROXime 250 MG tablet Commonly known as:  CEFTIN Take 1 tablet (250 mg total) by mouth 2 (two) times daily with a meal for 3 days.   donepezil 10 MG tablet Commonly known as:  ARICEPT Take 1 tablet (10 mg total) by mouth at bedtime.   FISH OIL PO Take 1,200 mg by mouth daily.   folic acid 1 MG tablet Commonly known as:  FOLVITE Take 1 mg by mouth daily.   levothyroxine 100 MCG tablet Commonly known as:  SYNTHROID, LEVOTHROID Take 100 mcg by mouth daily before breakfast.   linagliptin 5 MG Tabs tablet Commonly known as:  TRADJENTA Take 5 mg by mouth at bedtime.   megestrol 400 MG/10ML suspension Commonly known as:  MEGACE Take 10 mLs (400 mg total) by mouth daily.   memantine 10 MG tablet Commonly known as:  NAMENDA Take 1 tablet (10 mg total) by mouth 2 (two) times daily.   methotrexate 2.5 MG tablet Commonly known as:  RHEUMATREX Take 20 mg by mouth once a week. On Wednesdays.  Caution:Chemotherapy. Protect from light.   metoCLOPramide 10 MG tablet Commonly known as:  REGLAN Take 10 mg by mouth 3 (three) times daily before meals.   metoprolol succinate 50 MG 24 hr tablet Commonly known as:   TOPROL-XL Take 50 mg by mouth daily. Take with or immediately following a meal.   multivitamin with minerals Tabs tablet Take 1 tablet by mouth daily.   NEXIUM 40 MG capsule Generic drug:  esomeprazole Take 40 mg by mouth daily.   nitroGLYCERIN 0.4 MG SL tablet Commonly known as:  NITROSTAT Place 0.4 mg under the tongue every 5 (five) minutes as needed for chest pain.   risperiDONE 0.5 MG tablet Commonly known as:  RISPERDAL One in the morning, 2 tablets at night What changed:  additional instructions   TRILIPIX 135 MG capsule Generic drug:  Choline Fenofibrate Take 135 mg by mouth daily.   Vitamin D (Ergocalciferol) 50000 units Caps capsule Commonly known as:  DRISDOL Take 50,000 Units by mouth. Tuesdays and Fridays.       Vitals:   05/17/18 0439 05/17/18 0754  BP: (!) 157/63 (!) 162/66  Pulse: 73 82  Resp: 18 18  Temp: 98.7 F (37.1 C) 97.9 F (36.6 C)  SpO2: 98% 98%    Skin clean, dry and intact without evidence of skin break down, no evidence of skin tears noted. IV catheter discontinued intact. Site without signs and symptoms of complications. Dressing and pressure applied. Pt denies pain at this time. No complaints noted.  An After Visit Summary was printed and given to the  patient. Patient escorted via [place], and D/C home via private auto.  Aneta Mins BSN, RN

## 2018-05-20 ENCOUNTER — Other Ambulatory Visit: Payer: Self-pay

## 2018-05-20 NOTE — Patient Outreach (Signed)
[facility]) Care Management  [place]   05/20/2018  Casey Jennings 26-Dec-1950 [id number]   67 year old female outreached by [place] services for a 30 day post discharge medication review.  PMHx includes, but not limited to, hypertension, type 2 diabetes mellitus, gastroparesis, hyperparathyroidism, alzheimer's dementia, rheumatoid arthritis, and  CKD Stage IV.  Subjective: Spoke with Casey Jennings' husband who helps manage her medications, since she has dementia.  He states that she is back to her baseline and doing much better.  She will finish her antibiotic tomorrow.  Patient has a [employer], with CBG values in the range of 80-135.  Her Levemir was discontinued during her recent hospitalization.  Husband states that he is waiting on a prescription at [facility] to increase her appetite.     Objective:  Scr 1.25 mg/dL HgA1c 65 on 02/12/18   Medications Reviewed Today    Reviewed by Casey Jennings, [facility] (Pharmacist) on 05/20/18 at [place] List Status: <None>  Medication Order Taking? Sig Documenting Provider Last Dose Status Informant  Apoaequorin (PREVAGEN) 10 MG CAPS [id number] Yes Take 1 capsule by mouth daily. [provider] Taking Active Spouse/Significant Other  aspirin 81 MG tablet [id number] Yes Take 81 mg by mouth daily. [provider] Taking Active Self           Med Note Casey Jennings, Casey Jennings   Thu Oct 31, 2017  1:35 PM) *  atorvastatin (LIPITOR) 80 MG tablet [id number] Yes Take 80 mg by mouth daily.  [provider] Taking Active Self  Calcium Carbonate-Vitamin D (CALCIUM 600 + D PO) [id number] Yes Take 600 mg by mouth daily. [provider] Taking Active Self           Med Note Casey Jennings, Damien Fusi   Thu Oct 31, 2017  1:35 PM) *  cefUROXime (CEFTIN) 250 MG tablet [id number] Yes Take 1 tablet (250 mg total) by mouth 2 (two) times daily with a meal for 3 days. Casey Jennings, Casey Emerald, MD Taking Active   cetirizine  (ZYRTEC) 10 MG tablet [id number] Yes Take 10 mg by mouth daily. [provider] Taking Active Spouse/Significant Other  Choline Fenofibrate (TRILIPIX) 135 MG capsule [id number] Yes Take 135 mg by mouth daily. [provider] Taking Active Self  donepezil (ARICEPT) 10 MG tablet [id number] Yes Take 1 tablet (10 mg total) by mouth at bedtime. Casey Pacas, MD Taking Active   DULoxetine (CYMBALTA) 60 MG capsule [id number] Yes Take 60 mg by mouth daily. [provider] Taking Active Spouse/Significant Other  esomeprazole (NEXIUM) 20 MG capsule [id number] Yes Take 20 mg by mouth daily. [provider] Taking Active Spouse/Significant Other        Discontinued 05/20/18 1559 (Change in therapy)         Discontinued 05/20/18 1608 (No longer needed (for PRN medications))   insulin aspart (NOVOLOG FLEXPEN) 100 UNIT/ML FlexPen [id number] Yes Inject 16 Units into the skin 3 (three) times daily with meals. Uses about twice daily, because that is how often she eats [provider] Taking Active Spouse/Significant Other  levothyroxine (SYNTHROID, LEVOTHROID) 100 MCG tablet [id number] Yes Take 50-100 mcg by mouth daily before breakfast. Take 150mcg daily except Sunday when she take 50 mcg [provider] Taking Active Self        Discontinued 05/20/18 1604 (Discontinued by provider)         Discontinued 05/20/18 1633 (Discontinued by provider)   memantine (NAMENDA) 10 MG tablet [id number]  Yes Take 1 tablet (10 mg total) by mouth 2 (two) times daily. Casey Pacas, MD Taking Active         Discontinued 05/20/18 1604 (Patient Preference)   metoCLOPramide (REGLAN) 10 MG tablet [id number] Yes Take 10 mg by mouth 3 (three) times daily before meals.  [provider] Taking Active Self  metoprolol succinate (TOPROL-XL) 50 MG 24 hr tablet [id number] Yes Take 50 mg by mouth daily. Take with or immediately following a meal. [provider] Taking Active Self  Multiple  Vitamin (MULTIVITAMIN WITH MINERALS) TABS [id number] Yes Take 1 tablet by mouth daily. [provider] Taking Active Self  nitroGLYCERIN (NITROSTAT) 0.4 MG SL tablet [id number] Yes Place 0.4 mg under the tongue every 5 (five) minutes as needed for chest pain. [provider] Taking Active Self  Omega-3 Fatty Acids (FISH OIL PO) [id number] Yes Take 500 mg by mouth daily.  [provider] Taking Active   risperiDONE (RISPERDAL) 0.5 MG tablet [id number] Yes One in the morning, 2 tablets at night  Patient taking differently:  1 in the morning, 2 tablets at night   Casey Pacas, MD Taking Active Family Member           Med Note (Casey Jennings, Donalynn Jennings   Tue May 20, 2018  4:02 PM)    Vitamin D, Ergocalciferol, (DRISDOL) 50000 UNITS CAPS capsule [id number] Yes Take 50,000 Units by mouth. Tuesdays and Fridays. [provider] Taking Active Self         ASSESSMENT: Date Discharged from Hospital: 02/12/18 Date Medication Reconciliation Performed: 05/20/2018  Medications Discontinued at Discharge:   doxyccyline  Losartan/HTCZ  Insulin detemir  New Medications at Discharge:   Cefuroxime   Megestrol suspension (discontinued by Casey Jennings- never filled)   Patient was recently discharged from hospital and all medications have been reviewed  Drugs sorted by system:  Neurologic/Psychologic: donepezil, duloxetine, memantine, risperidone  Cardiovascular: aspirin 81 mg, atorvastatin, choline fenofibrate, metoprolol succinate, nitroglycerin  Gastrointestinal: esomeprazole, metoclopramide  Endocrine: insulin aspart, levothyroxine  Vitamins/Minerals: Prevagen, calcium/vitamin D, MVI, fish oil, ergocalciferol  Infectious Diseases: cefuroxime  Gaps in therapy:  Patient with type 2 diabletes mellitus and not on statin therapy.  Other issues noted:  Call received from [facility] at Casey Jennings office.  Casey Jennings does not want the patient to take Megestrol suspension.  Will  inform Casey Jennings.  They can discuss other options at her appointment next Tuesday.   PLAN: Route note to PCP, Casey Jennings.  Joetta Manners, PharmD Clinical Pharmacist [place] [phone]

## 2018-05-27 DIAGNOSIS — E1129 Type 2 diabetes mellitus with other diabetic kidney complication: Secondary | ICD-10-CM | POA: Diagnosis not present

## 2018-05-27 DIAGNOSIS — N390 Urinary tract infection, site not specified: Secondary | ICD-10-CM | POA: Diagnosis not present

## 2018-05-27 DIAGNOSIS — R627 Adult failure to thrive: Secondary | ICD-10-CM | POA: Diagnosis not present

## 2018-05-27 DIAGNOSIS — D6489 Other specified anemias: Secondary | ICD-10-CM | POA: Diagnosis not present

## 2018-05-27 DIAGNOSIS — E876 Hypokalemia: Secondary | ICD-10-CM | POA: Diagnosis not present

## 2018-05-27 DIAGNOSIS — I10 Essential (primary) hypertension: Secondary | ICD-10-CM | POA: Diagnosis not present

## 2018-05-27 DIAGNOSIS — F0390 Unspecified dementia without behavioral disturbance: Secondary | ICD-10-CM | POA: Diagnosis not present

## 2018-05-27 DIAGNOSIS — N184 Chronic kidney disease, stage 4 (severe): Secondary | ICD-10-CM | POA: Diagnosis not present

## 2018-05-27 DIAGNOSIS — Z6827 Body mass index (BMI) 27.0-27.9, adult: Secondary | ICD-10-CM | POA: Diagnosis not present

## 2018-06-02 ENCOUNTER — Encounter (HOSPITAL_COMMUNITY): Payer: Self-pay | Admitting: Emergency Medicine

## 2018-06-02 ENCOUNTER — Emergency Department (HOSPITAL_COMMUNITY): Payer: PPO

## 2018-06-02 ENCOUNTER — Other Ambulatory Visit: Payer: Self-pay

## 2018-06-02 ENCOUNTER — Observation Stay (HOSPITAL_COMMUNITY)
Admission: EM | Admit: 2018-06-02 | Discharge: 2018-06-03 | Disposition: A | Discharge status: Home / Self Care | Payer: PPO | Attending: Internal Medicine | Admitting: Internal Medicine

## 2018-06-02 DIAGNOSIS — G9349 Other encephalopathy: Secondary | ICD-10-CM | POA: Diagnosis not present

## 2018-06-02 DIAGNOSIS — I129 Hypertensive chronic kidney disease with stage 1 through stage 4 chronic kidney disease, or unspecified chronic kidney disease: Secondary | ICD-10-CM | POA: Insufficient documentation

## 2018-06-02 DIAGNOSIS — N390 Urinary tract infection, site not specified: Secondary | ICD-10-CM | POA: Insufficient documentation

## 2018-06-02 DIAGNOSIS — E860 Dehydration: Secondary | ICD-10-CM | POA: Insufficient documentation

## 2018-06-02 DIAGNOSIS — F0391 Unspecified dementia with behavioral disturbance: Secondary | ICD-10-CM | POA: Diagnosis not present

## 2018-06-02 DIAGNOSIS — R4182 Altered mental status, unspecified: Secondary | ICD-10-CM | POA: Diagnosis not present

## 2018-06-02 DIAGNOSIS — R410 Disorientation, unspecified: Secondary | ICD-10-CM

## 2018-06-02 DIAGNOSIS — G9340 Encephalopathy, unspecified: Secondary | ICD-10-CM | POA: Diagnosis present

## 2018-06-02 DIAGNOSIS — N184 Chronic kidney disease, stage 4 (severe): Secondary | ICD-10-CM | POA: Diagnosis present

## 2018-06-02 DIAGNOSIS — Z87891 Personal history of nicotine dependence: Secondary | ICD-10-CM | POA: Insufficient documentation

## 2018-06-02 DIAGNOSIS — Z79899 Other long term (current) drug therapy: Secondary | ICD-10-CM | POA: Diagnosis not present

## 2018-06-02 DIAGNOSIS — Z7982 Long term (current) use of aspirin: Secondary | ICD-10-CM | POA: Diagnosis not present

## 2018-06-02 DIAGNOSIS — F0390 Unspecified dementia without behavioral disturbance: Secondary | ICD-10-CM | POA: Insufficient documentation

## 2018-06-02 DIAGNOSIS — Z794 Long term (current) use of insulin: Secondary | ICD-10-CM | POA: Insufficient documentation

## 2018-06-02 DIAGNOSIS — E118 Type 2 diabetes mellitus with unspecified complications: Secondary | ICD-10-CM | POA: Diagnosis present

## 2018-06-02 DIAGNOSIS — E119 Type 2 diabetes mellitus without complications: Secondary | ICD-10-CM | POA: Diagnosis not present

## 2018-06-02 DIAGNOSIS — M069 Rheumatoid arthritis, unspecified: Secondary | ICD-10-CM | POA: Diagnosis not present

## 2018-06-02 DIAGNOSIS — I10 Essential (primary) hypertension: Secondary | ICD-10-CM | POA: Diagnosis present

## 2018-06-02 DIAGNOSIS — R443 Hallucinations, unspecified: Secondary | ICD-10-CM | POA: Diagnosis present

## 2018-06-02 DIAGNOSIS — F03918 Unspecified dementia, unspecified severity, with other behavioral disturbance: Secondary | ICD-10-CM | POA: Diagnosis present

## 2018-06-02 LAB — URINALYSIS, ROUTINE W REFLEX MICROSCOPIC
Bilirubin Urine: NEGATIVE
GLUCOSE, UA: 150 mg/dL — AB
HGB URINE DIPSTICK: NEGATIVE
Ketones, ur: NEGATIVE mg/dL
Nitrite: NEGATIVE
PROTEIN: 30 mg/dL — AB
Specific Gravity, Urine: 1.014 (ref 1.005–1.030)
pH: 6 (ref 5.0–8.0)

## 2018-06-02 LAB — COMPREHENSIVE METABOLIC PANEL
ALK PHOS: 28 U/L — AB (ref 38–126)
ALT: 22 U/L (ref 0–44)
AST: 42 U/L — ABNORMAL HIGH (ref 15–41)
Albumin: 3.5 g/dL (ref 3.5–5.0)
Anion gap: 8 (ref 5–15)
BUN: 16 mg/dL (ref 8–23)
CHLORIDE: 103 mmol/L (ref 98–111)
CO2: 25 mmol/L (ref 22–32)
Calcium: 9.6 mg/dL (ref 8.9–10.3)
Creatinine, Ser: 1.61 mg/dL — ABNORMAL HIGH (ref 0.44–1.00)
GFR calc Af Amer: 37 mL/min — ABNORMAL LOW (ref >60)
GFR, EST NON AFRICAN AMERICAN: 32 mL/min — AB (ref >60)
GLUCOSE: 197 mg/dL — AB (ref 70–99)
Potassium: 3.6 mmol/L (ref 3.5–5.1)
Sodium: 136 mmol/L (ref 135–145)
TOTAL PROTEIN: 6 g/dL — AB (ref 6.5–8.1)
Total Bilirubin: 1 mg/dL (ref 0.3–1.2)

## 2018-06-02 LAB — CBC WITH DIFFERENTIAL/PLATELET
ABS IMMATURE GRANULOCYTES: 0.02 10*3/uL (ref 0.00–0.07)
BASOS ABS: 0 10*3/uL (ref 0.0–0.1)
BASOS PCT: 1 %
EOS ABS: 0.1 10*3/uL (ref 0.0–0.5)
Eosinophils Relative: 1 %
HEMATOCRIT: 29.6 % — AB (ref 36.0–46.0)
HEMOGLOBIN: 9.6 g/dL — AB (ref 12.0–15.0)
IMMATURE GRANULOCYTES: 0 %
Lymphocytes Relative: 46 %
Lymphs Abs: 2.6 10*3/uL (ref 0.7–4.0)
MCH: 29.9 pg (ref 26.0–34.0)
MCHC: 32.4 g/dL (ref 30.0–36.0)
MCV: 92.2 fL (ref 80.0–100.0)
MONO ABS: 0.5 10*3/uL (ref 0.1–1.0)
MONOS PCT: 9 %
Neutro Abs: 2.5 10*3/uL (ref 1.7–7.7)
Neutrophils Relative %: 43 %
Platelets: 316 10*3/uL (ref 150–400)
RBC: 3.21 MIL/uL — AB (ref 3.87–5.11)
RDW: 12.8 % (ref 11.5–15.5)
WBC: 5.6 10*3/uL (ref 4.0–10.5)
nRBC: 0 % (ref 0.0–0.2)

## 2018-06-02 LAB — RAPID URINE DRUG SCREEN, HOSP PERFORMED
AMPHETAMINES: NOT DETECTED
BARBITURATES: NOT DETECTED
Benzodiazepines: NOT DETECTED
COCAINE: NOT DETECTED
Opiates: NOT DETECTED
Tetrahydrocannabinol: NOT DETECTED

## 2018-06-02 LAB — CBG MONITORING, ED: GLUCOSE-CAPILLARY: 206 mg/dL — AB (ref 70–99)

## 2018-06-02 LAB — ETHANOL: Alcohol, Ethyl (B): <10 mg/dL (ref <10)

## 2018-06-02 MED ORDER — SODIUM CHLORIDE 0.9 % IV BOLUS
500.0000 mL | Freq: Once | INTRAVENOUS | Status: AC
  Administered 2018-06-02: 500 mL via INTRAVENOUS

## 2018-06-02 NOTE — ED Triage Notes (Signed)
Pt presents from home for visual hallucinations. Pt denies any suicidal or homicidal ideations. Pt reports seeing figures for the last several days. Pt currently calm and cooperative at this time.

## 2018-06-02 NOTE — ED Notes (Signed)
ED TO INPATIENT HANDOFF REPORT  Name/Age/Gender Casey Jennings 67 y.o. female  Code Status Code Status History    Date Active Date Inactive Code Status Order ID Comments User Context   05/13/2018 2300 05/17/2018 1442 Full Code [id number]  Casey Bulls, MD ED   04/10/2014 2321 04/13/2014 1454 Full Code [id number]  Casey Overall, MD Inpatient    Advance Directive Documentation     Most Recent Value  Type of Advance Directive  Healthcare Power of Attorney, Living will  Pre-existing out of facility DNR order (yellow form or pink MOST form)  -  "MOST" Form in Place?  -      Home/SNF/Other Home  Chief Complaint Hallucinations;Dementia  Level of Care/Admitting Diagnosis ED Disposition    ED Disposition Condition [place]: [facility] [100102]  Level of Care: Med-Surg [16]  Diagnosis: Acute encephalopathy [492010]  Admitting Physician: Casey Jennings [phone]  Attending Physician: Casey Jennings [phone]  PT Class (Do Not Modify): Observation [104]  PT Acc Code (Do Not Modify): Observation [10022]       Medical History Past Medical History:  Diagnosis Date  . Anemia   . Arthritis   . Chronic kidney disease    hx of uti   . Depression   . Diabetes mellitus without complication (Major)   . GERD (gastroesophageal reflux disease)   . Heart murmur   . Hypercalcemia   . Hyperlipidemia   . Hypertension   . Memory loss   . Osteopenia   . Osteoporosis   . UTI (urinary tract infection) 05/14/2018  . Yeast infection    questinable per pt on 05/23/12- to let MD be aware of     Allergies Allergies  Allergen Reactions  . Codeine     hallucinations    IV Location/Drains/Wounds Patient Lines/Drains/Airways Status   Active Line/Drains/Airways    Name:   Placement date:   Placement time:   Site:   Days:   Peripheral IV 06/02/18 Right Antecubital   06/02/18    2210    Antecubital   less than 1           Labs/Imaging Results for orders placed or performed during the hospital encounter of 06/02/18 (from the past 48 hour(s))  CBG monitoring, ED     Status: Abnormal   Collection Time: 06/02/18  7:34 PM  Result Value Ref Range   Glucose-Capillary 206 (H) 70 - 99 mg/dL  Urine rapid drug screen (hosp performed)     Status: None   Collection Time: 06/02/18  8:27 PM  Result Value Ref Range   Opiates NONE DETECTED NONE DETECTED   Cocaine NONE DETECTED NONE DETECTED   Benzodiazepines NONE DETECTED NONE DETECTED   Amphetamines NONE DETECTED NONE DETECTED   Tetrahydrocannabinol NONE DETECTED NONE DETECTED   Barbiturates NONE DETECTED NONE DETECTED    Comment: (NOTE) DRUG SCREEN FOR MEDICAL PURPOSES ONLY.  IF CONFIRMATION IS NEEDED FOR ANY PURPOSE, NOTIFY LAB WITHIN 5 DAYS. LOWEST DETECTABLE LIMITS FOR URINE DRUG SCREEN Drug Class                     Cutoff (ng/mL) Amphetamine and metabolites    1000 Barbiturate and metabolites    200 Benzodiazepine                 883 Tricyclics and metabolites     300 Opiates and metabolites        300 Cocaine  and metabolites        300 THC                            50 Performed at [facility], [place] [address], [place], [place] [address]   Urinalysis, Routine w reflex microscopic     Status: Abnormal   Collection Time: 06/02/18  8:27 PM  Result Value Ref Range   Color, Urine YELLOW YELLOW   APPearance HAZY (A) CLEAR   Specific Gravity, Urine 1.014 1.005 - 1.030   pH 6.0 5.0 - 8.0   Glucose, UA 150 (A) NEGATIVE mg/dL   Hgb urine dipstick NEGATIVE NEGATIVE   Bilirubin Urine NEGATIVE NEGATIVE   Ketones, ur NEGATIVE NEGATIVE mg/dL   Protein, ur 30 (A) NEGATIVE mg/dL   Nitrite NEGATIVE NEGATIVE   Leukocytes, UA LARGE (A) NEGATIVE   RBC / HPF 0-5 0 - 5 RBC/hpf   WBC, UA >50 (H) 0 - 5 WBC/hpf   Bacteria, UA RARE (A) NONE SEEN   Squamous Epithelial / LPF 0-5 0 - 5   Mucus PRESENT    Budding Yeast PRESENT    Hyaline Casts,  UA PRESENT    Non Squamous Epithelial 0-5 (A) NONE SEEN    Comment: Performed at [facility], [place] [address] [place], [place] [address]  Comprehensive metabolic panel     Status: Abnormal   Collection Time: 06/02/18  8:57 PM  Result Value Ref Range   Sodium 136 135 - 145 mmol/L   Potassium 3.6 3.5 - 5.1 mmol/L   Chloride 103 98 - 111 mmol/L   CO2 25 22 - 32 mmol/L   Glucose, Bld 197 (H) 70 - 99 mg/dL   BUN 16 8 - 23 mg/dL   Creatinine, Ser 1.61 (H) 0.44 - 1.00 mg/dL   Calcium 9.6 8.9 - 10.3 mg/dL   Total Protein 6.0 (L) 6.5 - 8.1 g/dL   Albumin 3.5 3.5 - 5.0 g/dL   AST 42 (H) 15 - 41 U/L   ALT 22 0 - 44 U/L   Alkaline Phosphatase 28 (L) 38 - 126 U/L   Total Bilirubin 1.0 0.3 - 1.2 mg/dL   GFR calc non Af Amer 32 (L) >60 mL/min   GFR calc Af Amer 37 (L) >60 mL/min    Comment: (NOTE) The eGFR has been calculated using the CKD EPI equation. This calculation has not been validated in all clinical situations. eGFR's persistently <60 mL/min signify possible Chronic Kidney Disease.    Anion gap 8 5 - 15    Comment: Performed at [facility], [place] [address], [place], [place] [address]  Ethanol     Status: None   Collection Time: 06/02/18  8:57 PM  Result Value Ref Range   Alcohol, Ethyl (B) <10 <10 mg/dL    Comment: (NOTE) Lowest detectable limit for serum alcohol is 10 mg/dL. For medical purposes only. Performed at [facility], [place] [address], [place], [place] [address]   CBC with Differential     Status: Abnormal   Collection Time: 06/02/18  8:57 PM  Result Value Ref Range   WBC 5.6 4.0 - 10.5 K/uL   RBC 3.21 (L) 3.87 - 5.11 MIL/uL   Hemoglobin 9.6 (L) 12.0 - 15.0 g/dL   HCT 29.6 (L) 36.0 - 46.0 %   MCV 92.2 80.0 - 100.0 fL   MCH 29.9 26.0 - 34.0 pg  MCHC 32.4 30.0 - 36.0 g/dL   RDW 12.8 11.5 - 15.5 %   Platelets 316 150 - 400 K/uL   nRBC 0.0 0.0 - 0.2 %   Neutrophils Relative % 43 %   Neutro Abs 2.5 1.7 -  7.7 K/uL   Lymphocytes Relative 46 %   Lymphs Abs 2.6 0.7 - 4.0 K/uL   Monocytes Relative 9 %   Monocytes Absolute 0.5 0.1 - 1.0 K/uL   Eosinophils Relative 1 %   Eosinophils Absolute 0.1 0.0 - 0.5 K/uL   Basophils Relative 1 %   Basophils Absolute 0.0 0.0 - 0.1 K/uL   Immature Granulocytes 0 %   Abs Immature Granulocytes 0.02 0.00 - 0.07 K/uL    Comment: Performed at [facility], [place] [address], [place], [place] [address]   Dg Chest 2 View  Result Date: 06/02/2018 CLINICAL DATA:  67 year old female with a history of altered mental status. EXAM: CHEST - 2 VIEW COMPARISON:  05/23/2012 FINDINGS: Cardiomediastinal silhouette unchanged in size and contour. No evidence of central vascular congestion. No pneumothorax or pleural effusion. No confluent airspace disease. No displaced fracture IMPRESSION: Negative for acute cardiopulmonary disease Electronically Signed   By: Corrie Mckusick D.O.   On: 06/02/2018 20:48   EKG Interpretation  Date/Time:  Monday June 02 2018 21:11:05 EST Ventricular Rate:  85 PR Interval:  140 QRS Duration: 86 QT Interval:  386 QTC Calculation: 459 R Axis:   48 Text Interpretation:  Normal sinus rhythm Normal ECG Confirmed by Davonna Belling [phone]) on 06/02/2018 9:41:08 PM   Pending Labs Unresulted Labs (From admission, onward)    Start     Ordered   06/02/18 2146  Urine culture  ONCE - STAT,   STAT     06/02/18 2145          Vitals/Pain Today's Vitals   06/02/18 1921 06/02/18 1923  BP:  (!) 149/67  Pulse:  85  Resp:  19  Temp:  98.6 F (37 C)  TempSrc:  Oral  SpO2:  100%  Weight: 72.6 kg   Height: '5\' 4"'  (1.626 m)     Isolation Precautions No active isolations  Medications Medications  sodium chloride 0.9 % bolus 500 mL (has no administration in time range)    Mobility walks

## 2018-06-02 NOTE — ED Provider Notes (Signed)
[facility] DEPT Provider Note   CSN: [id number] Arrival date & time: 06/02/18  1903     History   Chief Complaint Chief Complaint  Patient presents with  . Hallucinations    HPI Casey Jennings is a 67 y.o. female. Level 5 caveat due to dementia/delirium. HPI Patient with history of dementia.  Has been doing worse recently.  Decreased oral intake.  Today was out at [employer].  When family member returned back outside she was out of the car had walked down the middle of the 3 lane Road.  Patient cannot tell me who her husband sitting and bursitis.  States she has been having hallucinations.  States she sees people around.  States he does not see it right now.  History of dementia but also has had delirium with UTI in the past. Past Medical History:  Diagnosis Date  . Anemia   . Arthritis   . Chronic kidney disease    hx of uti   . Depression   . Diabetes mellitus without complication ([place])   . GERD (gastroesophageal reflux disease)   . Heart murmur   . Hypercalcemia   . Hyperlipidemia   . Hypertension   . Memory loss   . Osteopenia   . Osteoporosis   . UTI (urinary tract infection) 05/14/2018  . Yeast infection    questinable per pt on 05/23/12- to let MD be aware of     Patient Active Problem List   Diagnosis Date Noted  . Acute encephalopathy 06/02/2018  . UTI (urinary tract infection) 05/14/2018  . Hypertension 05/13/2018  . CKD (chronic kidney disease), stage IV ([place]) 05/13/2018  . Acute lower UTI 05/13/2018  . Acute UTI 05/13/2018  . Dementia with behavioral disturbance ([place]) 10/31/2017  . Alzheimer's dementia with behavioral disturbance ([place]) 02/25/2017  . Memory loss 12/24/2016  . Visual hallucination 12/24/2016  . Diabetes mellitus type 2 with complications ([place]) 17/79/3903  . Gastroparesis due to DM ([place]) 04/11/2014  . Rheumatoid arthritis ([place]) 04/11/2014  . Cholecystitis 04/10/2014  . Hyperparathyroidism, primary ([place])  05/12/2012    Past Surgical History:  Procedure Laterality Date  . BACK SURGERY    . BLADDER SUSPENSION    . CARPAL TUNNEL RELEASE    . CESAREAN SECTION    . CHOLECYSTECTOMY N/A 04/11/2014   Procedure: LAPAROSCOPIC CHOLECYSTECTOMY WITH INTRAOPERATIVE CHOLANGIOGRAM;  Surgeon: [name], MD;  Location: [facility] ORS;  Service: General;  Laterality: N/A;  . PARATHYROIDECTOMY  05/27/2012   Procedure: PARATHYROIDECTOMY;  Surgeon: Earnstine Regal, MD;  Location: [facility] ORS;  Service: General;  Laterality: N/A;  . right ankle surgery      due to fracture   . SPINE SURGERY    . TONSILLECTOMY       OB History   None      Home Medications    Prior to Admission medications   Medication Sig Start Date End Date Taking? Authorizing Provider  Apoaequorin (PREVAGEN) 10 MG CAPS Take 1 capsule by mouth daily.   Yes [provider]  aspirin 81 MG tablet Take 81 mg by mouth daily.   Yes [provider]  atorvastatin (LIPITOR) 80 MG tablet Take 80 mg by mouth daily.    Yes [provider]  Calcium Carbonate-Vitamin D (CALCIUM 600 + D PO) Take 600 mg by mouth daily.   Yes [provider]  cetirizine (ZYRTEC) 10 MG tablet Take 10 mg by mouth daily.   Yes [provider]  Choline  Fenofibrate (TRILIPIX) 135 MG capsule Take 135 mg by mouth daily.   Yes [provider]  donepezil (ARICEPT) 10 MG tablet Take 1 tablet (10 mg total) by mouth at bedtime. 04/29/17  Yes Marcial Pacas, MD  DULoxetine (CYMBALTA) 60 MG capsule Take 60 mg by mouth daily.   Yes [provider]  esomeprazole (NEXIUM) 20 MG capsule Take 20 mg by mouth daily.   Yes [provider]  fluticasone (FLONASE) 50 MCG/ACT nasal spray Place 2 sprays into both nostrils daily as needed for allergies. 05/24/18  Yes [provider]  insulin aspart (NOVOLOG FLEXPEN) 100 UNIT/ML FlexPen Inject 16 Units into the skin 3 (three) times daily with meals. Uses about twice daily, because that  is how often she eats   Yes [provider]  levothyroxine (SYNTHROID, LEVOTHROID) 100 MCG tablet Take 50-100 mcg by mouth daily before breakfast. Take 184mcg daily except Sunday when she take 50 mcg   Yes [provider]  memantine (NAMENDA) 10 MG tablet Take 1 tablet (10 mg total) by mouth 2 (two) times daily. 07/30/17  Yes Marcial Pacas, MD  metoCLOPramide (REGLAN) 10 MG tablet Take 10 mg by mouth 3 (three) times daily before meals.    Yes [provider]  metoprolol succinate (TOPROL-XL) 50 MG 24 hr tablet Take 50 mg by mouth daily. Take with or immediately following a meal.   Yes [provider]  Multiple Vitamin (MULTIVITAMIN WITH MINERALS) TABS Take 1 tablet by mouth daily.   Yes [provider]  Omega-3 Fatty Acids (FISH OIL PO) Take 500 mg by mouth daily.    Yes [provider]  risperiDONE (RISPERDAL) 0.5 MG tablet One in the morning, 2 tablets at night Patient taking differently: 1 in the morning, 2 tablets at night 07/30/17  Yes Marcial Pacas, MD  Vitamin D, Ergocalciferol, (DRISDOL) 50000 UNITS CAPS capsule Take 50,000 Units by mouth. Tuesdays and Fridays.   Yes [provider]  nitroGLYCERIN (NITROSTAT) 0.4 MG SL tablet Place 0.4 mg under the tongue every 5 (five) minutes as needed for chest pain.    [provider]    Family History Family History  Problem Relation Age of Onset  . Diabetes Mother   . Heart attack Father     Social History Social History   Tobacco Use  . Smoking status: Former Smoker    Last attempt to quit: 05/07/1978    Years since quitting: 40.0  . Smokeless tobacco: Never Used  Substance Use Topics  . Alcohol use: Yes    Comment:  1- 2 times per year  . Drug use: No     Allergies   Codeine   Review of Systems Review of Systems  Unable to perform ROS: Dementia     Physical Exam Updated Vital Signs BP (!) 153/82 (BP Location: Left Arm)   Pulse 87   Temp 98.6 F (37 C)  (Oral)   Resp 16   Ht 5\' 4"  (1.626 m)   Wt 72.6 kg   SpO2 93%   BMI 27.46 kg/m   Physical Exam  Constitutional: She appears well-developed.  HENT:  Head: Atraumatic.  Eyes: Pupils are equal, round, and reactive to light.  Neck: Neck supple.  Cardiovascular: Normal rate.  Pulmonary/Chest: Effort normal.  Abdominal: There is no tenderness.  Musculoskeletal: She exhibits no edema.  Neurological: She is alert.  Moving all extremities.  No she is at the hospital but cannot identify her husband sitting next to her.  Skin: Skin is warm. Capillary refill takes less than 2 seconds.  Psychiatric: She has a normal mood and affect.     ED Treatments / Results  Labs (all labs ordered are listed, but only abnormal results are displayed) Labs Reviewed  COMPREHENSIVE METABOLIC PANEL - Abnormal; Notable for the following components:      Result Value   Glucose, Bld 197 (*)    Creatinine, Ser 1.61 (*)    Total Protein 6.0 (*)    AST 42 (*)    Alkaline Phosphatase 28 (*)    GFR calc non Af Amer 32 (*)    GFR calc Af Amer 37 (*)    All other components within normal limits  URINALYSIS, ROUTINE W REFLEX MICROSCOPIC - Abnormal; Notable for the following components:   APPearance HAZY (*)    Glucose, UA 150 (*)    Protein, ur 30 (*)    Leukocytes, UA LARGE (*)    WBC, UA >50 (*)    Bacteria, UA RARE (*)    Non Squamous Epithelial 0-5 (*)    All other components within normal limits  CBC WITH DIFFERENTIAL/PLATELET - Abnormal; Notable for the following components:   RBC 3.21 (*)    Hemoglobin 9.6 (*)    HCT 29.6 (*)    All other components within normal limits  CBG MONITORING, ED - Abnormal; Notable for the following components:   Glucose-Capillary 206 (*)    All other components within normal limits  URINE CULTURE  ETHANOL  RAPID URINE DRUG SCREEN, HOSP PERFORMED    EKG EKG Interpretation  Date/Time:  Monday June 02 2018 21:11:05 EST Ventricular Rate:  85 PR  Interval:  140 QRS Duration: 86 QT Interval:  386 QTC Calculation: 459 R Axis:   48 Text Interpretation:  Normal sinus rhythm Normal ECG Confirmed by Davonna Belling [phone]) on 06/02/2018 9:41:08 PM   Radiology Dg Chest 2 View  Result Date: 06/02/2018 CLINICAL DATA:  67 year old female with a history of altered mental status. EXAM: CHEST - 2 VIEW COMPARISON:  05/23/2012 FINDINGS: Cardiomediastinal silhouette unchanged in size and contour. No evidence of central vascular congestion. No pneumothorax or pleural effusion. No confluent airspace disease. No displaced fracture IMPRESSION: Negative for acute cardiopulmonary disease Electronically Signed   By: Corrie Mckusick D.O.   On: 06/02/2018 20:48    Procedures Procedures (including critical care time)  Medications Ordered in ED Medications  sodium chloride 0.9 % bolus 500 mL ( Intravenous Rate/Dose Verify 06/02/18 2304)     Initial Impression / Assessment and Plan / ED Course  I have reviewed the triage vital signs and the nursing notes.  Pertinent labs & imaging results that were available during my care of the patient were reviewed by me and considered in my medical decision making (see chart for details).     Patient with mental status changes.  Does have a history of dementia however has not been eating and drinking less.  Creatinine is increased from baseline.  Also urine shows potential infection.  Only rare bacteria but does have many white cells.  Culture is been sent.  I think at this point I cannot medically clear her that this is just a dementia not delirium.  Will admit to hospitalist  Final Clinical Impressions(s) / ED Diagnoses   Final diagnoses:  Dehydration  Dementia with behavioral disturbance, unspecified dementia type [facility])  Delirium    ED Discharge Orders    None       Davonna Belling,  MD 06/02/18 2335

## 2018-06-02 NOTE — ED Notes (Signed)
Pt family reports that pt was with family member at [employer] and did not want to get out of vehicle and family member returned 20 minutes later and found pt walking down the middle of a 3 lane road. Pt family members report that pt has been having confusion to who her husband was and was telling family members that there were people trying to take parts off of the car while in motion.

## 2018-06-03 ENCOUNTER — Encounter (HOSPITAL_COMMUNITY): Payer: Self-pay | Admitting: Internal Medicine

## 2018-06-03 ENCOUNTER — Emergency Department (HOSPITAL_COMMUNITY)
Admission: EM | Admit: 2018-06-03 | Discharge: 2018-06-03 | Disposition: A | Discharge status: Home / Self Care | Payer: PPO | Source: Home / Self Care | Attending: Emergency Medicine | Admitting: Emergency Medicine

## 2018-06-03 DIAGNOSIS — Z794 Long term (current) use of insulin: Secondary | ICD-10-CM | POA: Insufficient documentation

## 2018-06-03 DIAGNOSIS — F0390 Unspecified dementia without behavioral disturbance: Secondary | ICD-10-CM

## 2018-06-03 DIAGNOSIS — R410 Disorientation, unspecified: Secondary | ICD-10-CM | POA: Diagnosis not present

## 2018-06-03 DIAGNOSIS — G9340 Encephalopathy, unspecified: Secondary | ICD-10-CM | POA: Diagnosis not present

## 2018-06-03 DIAGNOSIS — I129 Hypertensive chronic kidney disease with stage 1 through stage 4 chronic kidney disease, or unspecified chronic kidney disease: Secondary | ICD-10-CM

## 2018-06-03 DIAGNOSIS — E119 Type 2 diabetes mellitus without complications: Secondary | ICD-10-CM | POA: Insufficient documentation

## 2018-06-03 DIAGNOSIS — N184 Chronic kidney disease, stage 4 (severe): Secondary | ICD-10-CM

## 2018-06-03 DIAGNOSIS — Z7982 Long term (current) use of aspirin: Secondary | ICD-10-CM

## 2018-06-03 DIAGNOSIS — Z79899 Other long term (current) drug therapy: Secondary | ICD-10-CM | POA: Insufficient documentation

## 2018-06-03 DIAGNOSIS — E860 Dehydration: Secondary | ICD-10-CM

## 2018-06-03 DIAGNOSIS — N390 Urinary tract infection, site not specified: Secondary | ICD-10-CM | POA: Diagnosis not present

## 2018-06-03 DIAGNOSIS — Z87891 Personal history of nicotine dependence: Secondary | ICD-10-CM | POA: Insufficient documentation

## 2018-06-03 DIAGNOSIS — R4182 Altered mental status, unspecified: Secondary | ICD-10-CM | POA: Diagnosis not present

## 2018-06-03 LAB — GLUCOSE, CAPILLARY
Glucose-Capillary: 151 mg/dL — ABNORMAL HIGH (ref 70–99)
Glucose-Capillary: 183 mg/dL — ABNORMAL HIGH (ref 70–99)

## 2018-06-03 LAB — HEPATIC FUNCTION PANEL
ALBUMIN: 3.1 g/dL — AB (ref 3.5–5.0)
ALK PHOS: 25 U/L — AB (ref 38–126)
ALT: 23 U/L (ref 0–44)
AST: 27 U/L (ref 15–41)
BILIRUBIN DIRECT: 0.2 mg/dL (ref 0.0–0.2)
BILIRUBIN INDIRECT: 0.3 mg/dL (ref 0.3–0.9)
BILIRUBIN TOTAL: 0.5 mg/dL (ref 0.3–1.2)
Total Protein: 5.7 g/dL — ABNORMAL LOW (ref 6.5–8.1)

## 2018-06-03 LAB — CBC
HEMATOCRIT: 31 % — AB (ref 36.0–46.0)
Hemoglobin: 9.9 g/dL — ABNORMAL LOW (ref 12.0–15.0)
MCH: 29.3 pg (ref 26.0–34.0)
MCHC: 31.9 g/dL (ref 30.0–36.0)
MCV: 91.7 fL (ref 80.0–100.0)
NRBC: 0 % (ref 0.0–0.2)
Platelets: 338 10*3/uL (ref 150–400)
RBC: 3.38 MIL/uL — ABNORMAL LOW (ref 3.87–5.11)
RDW: 12.9 % (ref 11.5–15.5)
WBC: 4.5 10*3/uL (ref 4.0–10.5)

## 2018-06-03 LAB — BASIC METABOLIC PANEL
Anion gap: 8 (ref 5–15)
BUN: 12 mg/dL (ref 8–23)
CHLORIDE: 107 mmol/L (ref 98–111)
CO2: 25 mmol/L (ref 22–32)
CREATININE: 1.32 mg/dL — AB (ref 0.44–1.00)
Calcium: 9.2 mg/dL (ref 8.9–10.3)
GFR calc non Af Amer: 40 mL/min — ABNORMAL LOW (ref >60)
GFR, EST AFRICAN AMERICAN: 47 mL/min — AB (ref >60)
Glucose, Bld: 161 mg/dL — ABNORMAL HIGH (ref 70–99)
Potassium: 3.4 mmol/L — ABNORMAL LOW (ref 3.5–5.1)
SODIUM: 140 mmol/L (ref 135–145)

## 2018-06-03 LAB — AMMONIA: AMMONIA: 22 umol/L (ref 9–35)

## 2018-06-03 LAB — TSH: TSH: 2.414 u[IU]/mL (ref 0.350–4.500)

## 2018-06-03 MED ORDER — METOPROLOL SUCCINATE ER 50 MG PO TB24
50.0000 mg | ORAL_TABLET | Freq: Every day | ORAL | Status: DC
  Administered 2018-06-03: 50 mg via ORAL
  Filled 2018-06-03: qty 1

## 2018-06-03 MED ORDER — RISPERIDONE 1 MG PO TABS: 1.0000 mg | ORAL_TABLET | Freq: Every day | ORAL | Status: DC

## 2018-06-03 MED ORDER — OMEGA-3-ACID ETHYL ESTERS 1 G PO CAPS
1.0000 g | ORAL_CAPSULE | Freq: Every day | ORAL | Status: DC
  Administered 2018-06-03: 1 g via ORAL
  Filled 2018-06-03: qty 1

## 2018-06-03 MED ORDER — RISPERIDONE 0.25 MG PO TABS: 0.5000 mg | ORAL_TABLET | Freq: Every day | ORAL | Status: DC

## 2018-06-03 MED ORDER — FLUTICASONE PROPIONATE 50 MCG/ACT NA SUSP: 2.0000 | Freq: Every day | NASAL | Status: DC | PRN

## 2018-06-03 MED ORDER — METOCLOPRAMIDE HCL 10 MG PO TABS
10.0000 mg | ORAL_TABLET | Freq: Three times a day (TID) | ORAL | Status: DC
  Administered 2018-06-03: 10 mg via ORAL
  Filled 2018-06-03 (×2): qty 1

## 2018-06-03 MED ORDER — ONDANSETRON HCL 4 MG PO TABS: 4.0000 mg | ORAL_TABLET | Freq: Four times a day (QID) | ORAL | Status: DC | PRN

## 2018-06-03 MED ORDER — ATORVASTATIN CALCIUM 40 MG PO TABS: 80.0000 mg | ORAL_TABLET | Freq: Every day | ORAL | Status: DC

## 2018-06-03 MED ORDER — INSULIN ASPART 100 UNIT/ML ~~LOC~~ SOLN
0.0000 [IU] | Freq: Three times a day (TID) | SUBCUTANEOUS | Status: DC
  Administered 2018-06-03 (×2): 2 [IU] via SUBCUTANEOUS

## 2018-06-03 MED ORDER — ASPIRIN EC 81 MG PO TBEC
81.0000 mg | DELAYED_RELEASE_TABLET | Freq: Every day | ORAL | Status: DC
  Administered 2018-06-03: 81 mg via ORAL
  Filled 2018-06-03: qty 1

## 2018-06-03 MED ORDER — MEMANTINE HCL 10 MG PO TABS
10.0000 mg | ORAL_TABLET | Freq: Two times a day (BID) | ORAL | Status: DC
  Administered 2018-06-03: 10 mg via ORAL
  Filled 2018-06-03: qty 1

## 2018-06-03 MED ORDER — APOAEQUORIN 10 MG PO CAPS: 1.0000 | ORAL_CAPSULE | Freq: Every day | ORAL | Status: DC

## 2018-06-03 MED ORDER — FENOFIBRATE 160 MG PO TABS
160.0000 mg | ORAL_TABLET | Freq: Every day | ORAL | Status: DC
  Administered 2018-06-03: 160 mg via ORAL
  Filled 2018-06-03: qty 1

## 2018-06-03 MED ORDER — ONDANSETRON HCL 4 MG/2ML IJ SOLN: 4.0000 mg | Freq: Four times a day (QID) | INTRAMUSCULAR | Status: DC | PRN

## 2018-06-03 MED ORDER — DULOXETINE HCL 60 MG PO CPEP
60.0000 mg | ORAL_CAPSULE | Freq: Every day | ORAL | Status: DC
  Administered 2018-06-03: 60 mg via ORAL
  Filled 2018-06-03: qty 1

## 2018-06-03 MED ORDER — RISPERIDONE 0.25 MG PO TABS
0.5000 mg | ORAL_TABLET | Freq: Every day | ORAL | Status: DC
  Administered 2018-06-03: 0.5 mg via ORAL
  Filled 2018-06-03: qty 2

## 2018-06-03 MED ORDER — ENOXAPARIN SODIUM 40 MG/0.4ML ~~LOC~~ SOLN
40.0000 mg | Freq: Every day | SUBCUTANEOUS | Status: DC
  Administered 2018-06-03: 40 mg via SUBCUTANEOUS
  Filled 2018-06-03: qty 0.4

## 2018-06-03 MED ORDER — DONEPEZIL HCL 10 MG PO TABS: 10.0000 mg | ORAL_TABLET | Freq: Every day | ORAL | Status: DC

## 2018-06-03 MED ORDER — LEVOTHYROXINE SODIUM 50 MCG PO TABS: 50.0000 ug | ORAL_TABLET | ORAL | Status: DC

## 2018-06-03 MED ORDER — LEVOTHYROXINE SODIUM 100 MCG PO TABS
100.0000 ug | ORAL_TABLET | ORAL | Status: DC
  Filled 2018-06-03: qty 1

## 2018-06-03 MED ORDER — CEFDINIR 300 MG PO CAPS: 300.0000 mg | ORAL_CAPSULE | Freq: Two times a day (BID) | ORAL | 0 refills | Status: AC

## 2018-06-03 MED ORDER — PANTOPRAZOLE SODIUM 40 MG PO TBEC
40.0000 mg | DELAYED_RELEASE_TABLET | Freq: Every day | ORAL | Status: DC
  Administered 2018-06-03: 40 mg via ORAL
  Filled 2018-06-03: qty 1

## 2018-06-03 MED ORDER — NITROGLYCERIN 0.4 MG SL SUBL: 0.4000 mg | SUBLINGUAL_TABLET | SUBLINGUAL | Status: DC | PRN

## 2018-06-03 MED ORDER — LORAZEPAM 2 MG/ML IJ SOLN
1.0000 mg | Freq: Once | INTRAMUSCULAR | Status: AC
  Administered 2018-06-03: 1 mg via INTRAVENOUS
  Filled 2018-06-03: qty 1

## 2018-06-03 MED ORDER — ACETAMINOPHEN 650 MG RE SUPP: 650.0000 mg | Freq: Four times a day (QID) | RECTAL | Status: DC | PRN

## 2018-06-03 MED ORDER — ACETAMINOPHEN 325 MG PO TABS: 650.0000 mg | ORAL_TABLET | Freq: Four times a day (QID) | ORAL | Status: DC | PRN

## 2018-06-03 MED ORDER — SODIUM CHLORIDE 0.9 % IV SOLN
INTRAVENOUS | Status: DC
  Administered 2018-06-03 (×2): via INTRAVENOUS

## 2018-06-03 MED ORDER — SODIUM CHLORIDE 0.9 % IV SOLN
1.0000 g | Freq: Every day | INTRAVENOUS | Status: DC
  Administered 2018-06-03: 1 g via INTRAVENOUS
  Filled 2018-06-03: qty 1

## 2018-06-03 NOTE — H&P (Signed)
History and Physical    Casey Jennings EYC:[id number] DOB: [birth year] DOA: 06/02/2018  PCP: Reynold Bowen, MD  Patient coming from: Home.  History obtained from patient's husband.  Chief Complaint: Increasing confusion and decreased p.o. intake.  HPI: Casey Jennings is a 67 y.o. female with history of dementia with behavioral disturbances, hypertension, diabetes mellitus type 2, rheumatoid arthritis, hypothyroidism was recently admitted and discharged 3 weeks ago for similar complaints including decreased p.o. intake and confusion at the time patient was found to be in acute renal failure with UTI and will discharge after discontinuing patient's long-acting insulin ARB hydrochlorothiazide and treated for UTI was brought back to the ER by patient has been out of patient was found to be having decreased p.o. intake over the last few days and has been having increasing hallucination and confusion.  Patient has been denies patient having any fall or loss of consciousness or any chest pain or shortness of breath nausea vomiting or diarrhea.  ED Course: In the ER patient on exam has no abdominal pain is not febrile.  Is only oriented to her name.  Labs revealed creatinine has worsened from recent of 1.2-1.6.  UA is compatible with UTI.  Patient is being admitted for acute worsening of renal function with increasing confusion with UTI.  Review of Systems: As per HPI, rest all negative.   Past Medical History:  Diagnosis Date  . Anemia   . Arthritis   . Chronic kidney disease    hx of uti   . Depression   . Diabetes mellitus without complication ([place])   . GERD (gastroesophageal reflux disease)   . Heart murmur   . Hypercalcemia   . Hyperlipidemia   . Hypertension   . Memory loss   . Osteopenia   . Osteoporosis   . UTI (urinary tract infection) 05/14/2018  . Yeast infection    questinable per pt on 05/23/12- to let MD be aware of     Past Surgical History:  Procedure Laterality  Date  . BACK SURGERY    . BLADDER SUSPENSION    . CARPAL TUNNEL RELEASE    . CESAREAN SECTION    . CHOLECYSTECTOMY N/A 04/11/2014   Procedure: LAPAROSCOPIC CHOLECYSTECTOMY WITH INTRAOPERATIVE CHOLANGIOGRAM;  Surgeon: [name], MD;  Location: [facility] ORS;  Service: General;  Laterality: N/A;  . PARATHYROIDECTOMY  05/27/2012   Procedure: PARATHYROIDECTOMY;  Surgeon: Earnstine Regal, MD;  Location: [facility] ORS;  Service: General;  Laterality: N/A;  . right ankle surgery      due to fracture   . SPINE SURGERY    . TONSILLECTOMY       reports that she quit smoking about 40 years ago. She has never used smokeless tobacco. She reports that she drinks alcohol. She reports that she does not use drugs.  Allergies  Allergen Reactions  . Codeine     hallucinations    Family History  Problem Relation Age of Onset  . Diabetes Mother   . Heart attack Father     Prior to Admission medications   Medication Sig Start Date End Date Taking? Authorizing Provider  Apoaequorin (PREVAGEN) 10 MG CAPS Take 1 capsule by mouth daily.   Yes [provider]  aspirin 81 MG tablet Take 81 mg by mouth daily.   Yes [provider]  atorvastatin (LIPITOR) 80 MG tablet Take 80 mg by mouth daily.    Yes [provider]  Calcium Carbonate-Vitamin D (CALCIUM 600 + D PO)  Take 600 mg by mouth daily.   Yes [provider]  cetirizine (ZYRTEC) 10 MG tablet Take 10 mg by mouth daily.   Yes [provider]  Choline Fenofibrate (TRILIPIX) 135 MG capsule Take 135 mg by mouth daily.   Yes [provider]  donepezil (ARICEPT) 10 MG tablet Take 1 tablet (10 mg total) by mouth at bedtime. 04/29/17  Yes Marcial Pacas, MD  DULoxetine (CYMBALTA) 60 MG capsule Take 60 mg by mouth daily.   Yes [provider]  esomeprazole (NEXIUM) 20 MG capsule Take 20 mg by mouth daily.   Yes [provider]  fluticasone (FLONASE) 50 MCG/ACT nasal spray Place 2 sprays into both nostrils  daily as needed for allergies. 05/24/18  Yes [provider]  insulin aspart (NOVOLOG FLEXPEN) 100 UNIT/ML FlexPen Inject 16 Units into the skin 3 (three) times daily with meals. Uses about twice daily, because that is how often she eats   Yes [provider]  levothyroxine (SYNTHROID, LEVOTHROID) 100 MCG tablet Take 50-100 mcg by mouth daily before breakfast. Take 136mcg daily except Sunday when she take 50 mcg   Yes [provider]  memantine (NAMENDA) 10 MG tablet Take 1 tablet (10 mg total) by mouth 2 (two) times daily. 07/30/17  Yes Marcial Pacas, MD  metoCLOPramide (REGLAN) 10 MG tablet Take 10 mg by mouth 3 (three) times daily before meals.    Yes [provider]  metoprolol succinate (TOPROL-XL) 50 MG 24 hr tablet Take 50 mg by mouth daily. Take with or immediately following a meal.   Yes [provider]  Multiple Vitamin (MULTIVITAMIN WITH MINERALS) TABS Take 1 tablet by mouth daily.   Yes [provider]  Omega-3 Fatty Acids (FISH OIL PO) Take 500 mg by mouth daily.    Yes [provider]  risperiDONE (RISPERDAL) 0.5 MG tablet One in the morning, 2 tablets at night Patient taking differently: 1 in the morning, 2 tablets at night 07/30/17  Yes Marcial Pacas, MD  Vitamin D, Ergocalciferol, (DRISDOL) 50000 UNITS CAPS capsule Take 50,000 Units by mouth. Tuesdays and Fridays.   Yes [provider]  nitroGLYCERIN (NITROSTAT) 0.4 MG SL tablet Place 0.4 mg under the tongue every 5 (five) minutes as needed for chest pain.    [provider]    Physical Exam: Vitals:   06/02/18 1921 06/02/18 1923 06/02/18 2303 06/02/18 2348  BP:  (!) 149/67 (!) 153/82 (!) 154/75  Pulse:  85 87 89  Resp:  19 16 14   Temp:  98.6 F (37 C)  98.5 F (36.9 C)  TempSrc:  Oral  Oral  SpO2:  100% 93% 98%  Weight: 72.6 kg     Height: 5\' 4"  (1.626 m)         Constitutional: Moderately built and nourished. Vitals:   06/02/18 1921 06/02/18 1923  06/02/18 2303 06/02/18 2348  BP:  (!) 149/67 (!) 153/82 (!) 154/75  Pulse:  85 87 89  Resp:  19 16 14   Temp:  98.6 F (37 C)  98.5 F (36.9 C)  TempSrc:  Oral  Oral  SpO2:  100% 93% 98%  Weight: 72.6 kg     Height: 5\' 4"  (1.626 m)      Eyes: Anicteric no pallor. ENMT: No discharge from the ears eyes nose or mouth. Neck: No mass felt.  No neck rigidity.  No JVD appreciated. Respiratory: No rhonchi or crepitations. Cardiovascular: S1-S2 heard no murmurs appreciated. Abdomen: Soft nontender bowel  sounds present. Musculoskeletal: No edema. Skin: No rash. Neurologic: Alert awake oriented to her name only.  Moves all extremities. Psychiatric: Appears confused.   Labs on Admission: I have personally reviewed following labs and imaging studies  CBC: Recent Labs  Lab 06/02/18 2057  WBC 5.6  NEUTROABS 2.5  HGB 9.6*  HCT 29.6*  MCV 92.2  PLT 712   Basic Metabolic Panel: Recent Labs  Lab 06/02/18 2057  NA 136  K 3.6  CL 103  CO2 25  GLUCOSE 197*  BUN 16  CREATININE 1.61*  CALCIUM 9.6   GFR: Estimated Creatinine Clearance: 32.7 mL/min (A) (by C-G formula based on SCr of 1.61 mg/dL (H)). Liver Function Tests: Recent Labs  Lab 06/02/18 2057  AST 42*  ALT 22  ALKPHOS 28*  BILITOT 1.0  PROT 6.0*  ALBUMIN 3.5   No results for input(s): LIPASE, AMYLASE in the last 168 hours. No results for input(s): AMMONIA in the last 168 hours. Coagulation Profile: No results for input(s): INR, PROTIME in the last 168 hours. Cardiac Enzymes: No results for input(s): CKTOTAL, CKMB, CKMBINDEX, TROPONINI in the last 168 hours. BNP (last 3 results) No results for input(s): PROBNP in the last 8760 hours. HbA1C: No results for input(s): HGBA1C in the last 72 hours. CBG: Recent Labs  Lab 06/02/18 1934  GLUCAP 206*   Lipid Profile: No results for input(s): CHOL, HDL, LDLCALC, TRIG, CHOLHDL, LDLDIRECT in the last 72 hours. Thyroid Function Tests: No results for input(s): TSH,  T4TOTAL, FREET4, T3FREE, THYROIDAB in the last 72 hours. Anemia Panel: No results for input(s): VITAMINB12, FOLATE, FERRITIN, TIBC, IRON, RETICCTPCT in the last 72 hours. Urine analysis:    Component Value Date/Time   COLORURINE YELLOW 06/02/2018 2027   APPEARANCEUR HAZY (A) 06/02/2018 2027   LABSPEC 1.014 06/02/2018 2027   PHURINE 6.0 06/02/2018 2027   GLUCOSEU 150 (A) 06/02/2018 2027   HGBUR NEGATIVE 06/02/2018 2027   BILIRUBINUR NEGATIVE 06/02/2018 2027   KETONESUR NEGATIVE 06/02/2018 2027   PROTEINUR 30 (A) 06/02/2018 2027   UROBILINOGEN 1.0 04/10/2014 1831   NITRITE NEGATIVE 06/02/2018 2027   LEUKOCYTESUR LARGE (A) 06/02/2018 2027   Sepsis Labs: @LABRCNTIP (procalcitonin:4,lacticidven:4) )No results found for this or any previous visit (from the past 240 hour(s)).   Radiological Exams on Admission: Dg Chest 2 View  Result Date: 06/02/2018 CLINICAL DATA:  67 year old female with a history of altered mental status. EXAM: CHEST - 2 VIEW COMPARISON:  05/23/2012 FINDINGS: Cardiomediastinal silhouette unchanged in size and contour. No evidence of central vascular congestion. No pneumothorax or pleural effusion. No confluent airspace disease. No displaced fracture IMPRESSION: Negative for acute cardiopulmonary disease Electronically Signed   By: Corrie Mckusick D.O.   On: 06/02/2018 20:48    EKG: Independently reviewed.  Normal sinus rhythm.  Assessment/Plan Principal Problem:   Acute encephalopathy Active Problems:   Diabetes mellitus type 2 with complications ([facility])   Rheumatoid arthritis ([facility])   Dementia with behavioral disturbance ([facility])   Hypertension   CKD (chronic kidney disease), stage IV ([facility])   Acute lower UTI   UTI (urinary tract infection)   Dehydration    1. Acute encephalopathy with a history of dementia with behavioral disturbances likely could have been precipitated by dehydration and UTI.  Patient placed on empiric antibiotics for UTI and IV fluids.  Check  ammonia levels. 2. Acute on chronic kidney disease stage III -gently hydrate follow metabolic panel. 3. Dementia with behavioral disturbances on Risperdal and will continue Aricept and Namenda.  On Cymbalta. 4. UTI on ceftriaxone follow urine cultures. 5. Hypertension on metoprolol. 6. Diabetes mellitus type 2 we will keep patient on sliding scale coverage. 7. Rheumatoid arthritis on methotrexate. 8. Hypothyroidism on Synthroid. 9. Chronic anemia follow CBC.  During recent admission patient had B12 folate levels all checked within normal limits.   DVT prophylaxis: Lovenox. Code Status: DNR as confirmed with patient's husband. Family Communication: Patient husband. Disposition Plan: To be determined. Consults called: Education officer, museum. Admission status: Observation.   Rise Patience MD [facility] Pager [phone].  If 7PM-7AM, please contact night-coverage [email or web address] Password [email or web address]  06/03/2018, 2:48 AM

## 2018-06-03 NOTE — ED Notes (Signed)
Social Worker and Tourist information centre manager in to talk with family

## 2018-06-03 NOTE — Discharge Instructions (Signed)

## 2018-06-03 NOTE — Discharge Summary (Signed)
Physician Discharge Summary  Casey Jennings JWJ:[id number] DOB: 03/14/1951 DOA: 06/02/2018  PCP: Reynold Bowen, MD  Admit date: 06/02/2018 Discharge date: 06/03/2018  Admitted From: Home Disposition:  Home  Recommendations for Outpatient Follow-up:  1. Follow up with PCP in 1-2 weeks 2. Please obtain BMP/CBC in one week 3. Please follow up on the urine culture ordered 06/03/2018  Home Health: No Equipment/Devices:No  Discharge Condition: stable CODE STATUS: DNR Diet recommendation: Regular diet  Brief/Interim Summary:  #) Acute encephalopathy superimposed on dementia due to UTI: Patient was admitted with acute encephalopathy likely secondary to UTI and dehydration.  On extensive discussion with the patient's husband patient apparently progressively stops eating and drinking and then develops UTI and becomes altered.  Here she was given IV fluids and IV ceftriaxone and dramatically improved in 1 day.  She was discharged home on oral Ceftin ear to complete a total of 7 days.  On discussion with the husband he understands that because of her progressive dementia that her failure to thrive will only worsen.  He is contemplating whether or not PEG tube versus discussion of end-of-life care is not appropriate.  Otherwise patient was continued on home donepezil, memantine, risperidone.  She does have a urine culture pending.  #) Hypertension/hyperlipidemia: Patient was continued on aspirin, atorvastatin, metoprolol, fenofibrate.  #) Pain/psych/rheumatoid arthritis: Patient was continued on home duloxetine.  #)  type 2 diabetes: Patient's home insulin aspart was held.  She was maintained on sliding scale here.  She may restart her home regimen on discharge.  #) Stage III CKD: This was stable during this hospitalization.  Discharge Diagnoses:  Principal Problem:   Acute encephalopathy Active Problems:   Diabetes mellitus type 2 with complications (HCC)   Rheumatoid arthritis (HCC)    Dementia with behavioral disturbance (HCC)   Hypertension   CKD (chronic kidney disease), stage IV (HCC)   Acute lower UTI   UTI (urinary tract infection)   Dehydration    Discharge Instructions  Discharge Instructions    Call MD for:  difficulty breathing, headache or visual disturbances   Complete by:  As directed    Call MD for:  hives   Complete by:  As directed    Call MD for:  persistant dizziness or light-headedness   Complete by:  As directed    Call MD for:  persistant nausea and vomiting   Complete by:  As directed    Call MD for:  redness, tenderness, or signs of infection (pain, swelling, redness, odor or green/yellow discharge around incision site)   Complete by:  As directed    Call MD for:  severe uncontrolled pain   Complete by:  As directed    Call MD for:  temperature >100.4   Complete by:  As directed    Diet - low sodium heart healthy   Complete by:  As directed    Discharge instructions   Complete by:  As directed    Please take your antibiotics as prescribed.  Please follow-up with your primary care doctor in 1 week.   Increase activity slowly   Complete by:  As directed      Allergies as of 06/03/2018      Reactions   Codeine    hallucinations      Medication List    TAKE these medications   aspirin 81 MG tablet Take 81 mg by mouth daily.   atorvastatin 80 MG tablet Commonly known as:  LIPITOR Take 80 mg by mouth  daily.   CALCIUM 600 + D PO Take 600 mg by mouth daily.   cefdinir 300 MG capsule Commonly known as:  OMNICEF Take 1 capsule (300 mg total) by mouth 2 (two) times daily for 10 days.   cetirizine 10 MG tablet Commonly known as:  ZYRTEC Take 10 mg by mouth daily.   donepezil 10 MG tablet Commonly known as:  ARICEPT Take 1 tablet (10 mg total) by mouth at bedtime.   DULoxetine 60 MG capsule Commonly known as:  CYMBALTA Take 60 mg by mouth daily.   esomeprazole 20 MG capsule Commonly known as:  NEXIUM Take 20 mg by  mouth daily.   FISH OIL PO Take 500 mg by mouth daily.   fluticasone 50 MCG/ACT nasal spray Commonly known as:  FLONASE Place 2 sprays into both nostrils daily as needed for allergies.   levothyroxine 100 MCG tablet Commonly known as:  SYNTHROID, LEVOTHROID Take 50-100 mcg by mouth daily before breakfast. Take 126mcg daily except Sunday when she take 50 mcg   memantine 10 MG tablet Commonly known as:  NAMENDA Take 1 tablet (10 mg total) by mouth 2 (two) times daily.   metoCLOPramide 10 MG tablet Commonly known as:  REGLAN Take 10 mg by mouth 3 (three) times daily before meals.   metoprolol succinate 50 MG 24 hr tablet Commonly known as:  TOPROL-XL Take 50 mg by mouth daily. Take with or immediately following a meal.   multivitamin with minerals Tabs tablet Take 1 tablet by mouth daily.   nitroGLYCERIN 0.4 MG SL tablet Commonly known as:  NITROSTAT Place 0.4 mg under the tongue every 5 (five) minutes as needed for chest pain.   NOVOLOG FLEXPEN 100 UNIT/ML FlexPen Generic drug:  insulin aspart Inject 16 Units into the skin 3 (three) times daily with meals. Uses about twice daily, because that is how often she eats   PREVAGEN 10 MG Caps Generic drug:  Apoaequorin Take 1 capsule by mouth daily.   risperiDONE 0.5 MG tablet Commonly known as:  RISPERDAL One in the morning, 2 tablets at night What changed:  additional instructions   TRILIPIX 135 MG capsule Generic drug:  Choline Fenofibrate Take 135 mg by mouth daily.   Vitamin D (Ergocalciferol) 1.25 MG (50000 UT) Caps capsule Commonly known as:  DRISDOL Take 50,000 Units by mouth. Tuesdays and Fridays.       Allergies  Allergen Reactions  . Codeine     hallucinations    Consultations:  None   Procedures/Studies: Dg Chest 2 View  Result Date: 06/02/2018 CLINICAL DATA:  67 year old female with a history of altered mental status. EXAM: CHEST - 2 VIEW COMPARISON:  05/23/2012 FINDINGS: Cardiomediastinal  silhouette unchanged in size and contour. No evidence of central vascular congestion. No pneumothorax or pleural effusion. No confluent airspace disease. No displaced fracture IMPRESSION: Negative for acute cardiopulmonary disease Electronically Signed   By: Corrie Mckusick D.O.   On: 06/02/2018 20:48       Subjective:   Discharge Exam: Vitals:   06/02/18 2348 06/03/18 0635  BP: (!) 154/75 (!) 144/81  Pulse: 89 76  Resp: 14 16  Temp: 98.5 F (36.9 C) 98.3 F (36.8 C)  SpO2: 98% 98%   Vitals:   06/02/18 1923 06/02/18 2303 06/02/18 2348 06/03/18 0635  BP: (!) 149/67 (!) 153/82 (!) 154/75 (!) 144/81  Pulse: 85 87 89 76  Resp: 19 16 14 16   Temp: 98.6 F (37 C)  98.5 F (36.9 C) 98.3  F (36.8 C)  TempSrc: Oral  Oral Oral  SpO2: 100% 93% 98% 98%  Weight:      Height:       General exam: Appears calm and comfortable  Respiratory system: Clear to auscultation. Respiratory effort normal. Cardiovascular system: Regular rate and rhythm, no murmurs Gastrointestinal system: Soft, nondistended, no rebound or guarding, plus bowel sounds Central nervous system: Alert but not oriented, unable to participate neurological exam grossly moving all extremities Extremities: Trace lower extremity edema. Skin: No rashes over visible skin Psychiatry: Unable to assess due to mental status    The results of significant diagnostics from this hospitalization (including imaging, microbiology, ancillary and laboratory) are listed below for reference.     Microbiology: No results found for this or any previous visit (from the past 240 hour(s)).   Labs: BNP (last 3 results) No results for input(s): BNP in the last 8760 hours. Basic Metabolic Panel: Recent Labs  Lab 06/02/18 2057 06/03/18 0538  NA 136 140  K 3.6 3.4*  CL 103 107  CO2 25 25  GLUCOSE 197* 161*  BUN 16 12  CREATININE 1.61* 1.32*  CALCIUM 9.6 9.2   Liver Function Tests: Recent Labs  Lab 06/02/18 2057 06/03/18 0538   AST 42* 27  ALT 22 23  ALKPHOS 28* 25*  BILITOT 1.0 0.5  PROT 6.0* 5.7*  ALBUMIN 3.5 3.1*   No results for input(s): LIPASE, AMYLASE in the last 168 hours. Recent Labs  Lab 06/03/18 0538  AMMONIA 22   CBC: Recent Labs  Lab 06/02/18 2057 06/03/18 0538  WBC 5.6 4.5  NEUTROABS 2.5  --   HGB 9.6* 9.9*  HCT 29.6* 31.0*  MCV 92.2 91.7  PLT 316 338   Cardiac Enzymes: No results for input(s): CKTOTAL, CKMB, CKMBINDEX, TROPONINI in the last 168 hours. BNP: Invalid input(s): POCBNP CBG: Recent Labs  Lab 06/02/18 1934 06/03/18 0745 06/03/18 1203  GLUCAP 206* 151* 183*   D-Dimer No results for input(s): DDIMER in the last 72 hours. Hgb A1c No results for input(s): HGBA1C in the last 72 hours. Lipid Profile No results for input(s): CHOL, HDL, LDLCALC, TRIG, CHOLHDL, LDLDIRECT in the last 72 hours. Thyroid function studies Recent Labs    06/03/18 0538  TSH 2.414   Anemia work up No results for input(s): VITAMINB12, FOLATE, FERRITIN, TIBC, IRON, RETICCTPCT in the last 72 hours. Urinalysis    Component Value Date/Time   COLORURINE YELLOW 06/02/2018 2027   APPEARANCEUR HAZY (A) 06/02/2018 2027   LABSPEC 1.014 06/02/2018 2027   PHURINE 6.0 06/02/2018 2027   GLUCOSEU 150 (A) 06/02/2018 2027   HGBUR NEGATIVE 06/02/2018 2027   BILIRUBINUR NEGATIVE 06/02/2018 2027   KETONESUR NEGATIVE 06/02/2018 2027   PROTEINUR 30 (A) 06/02/2018 2027   UROBILINOGEN 1.0 04/10/2014 1831   NITRITE NEGATIVE 06/02/2018 2027   LEUKOCYTESUR LARGE (A) 06/02/2018 2027   Sepsis Labs Invalid input(s): PROCALCITONIN,  WBC,  LACTICIDVEN Microbiology No results found for this or any previous visit (from the past 240 hour(s)).   Time coordinating discharge: 35  SIGNED:   Cristy Folks, MD  [facility] 06/03/2018, 1:39 PM  If 7PM-7AM, please contact night-coverage [email or web address] Password [email or web address]

## 2018-06-03 NOTE — Progress Notes (Signed)
CSW and RNCM met at length with pt's husband, Rickey Lundeen. Provided emotional support and information about keeping pt safe. Pt's husband wants to continue to keep pt at home. CSW provided resources for [facility] and [facility]ares. CSW provided a list of Memory Care Units in the [place] area. Pt's husband aware that pt may need Memory Care Unit in the future. Pt's husband at this time wants to try to keep patient at home.   Plan: Pt to transition home with home health. RNCM working to establish home health for pt.    , LCSWA [facility]  [phone]  

## 2018-06-03 NOTE — ED Triage Notes (Addendum)
Pt brought in by [employer] after she was found a couple of blocks away from her home. [employer] helped patient get warm and attempted to help her back home. Pt refused to go home at that time and was becoming increasingly agitated.   Patient states "I left [place] this afternoon and went to my friend's house. My friend was going to help me home, but he didn't seem like he wanted to help me home."

## 2018-06-03 NOTE — Discharge Instructions (Addendum)
You have been seen today for confusion. Please read and follow all provided instructions.   1. Medications: usual home medications, continue antibiotic for UTI as prescribed 2. Treatment: rest, drink plenty of fluids, 3. Follow Up: Please follow up with your primary doctor in 7 days for discussion of your diagnoses and further evaluation after today's visit; if you do not have a primary care doctor use the resource guide provided to find one; Please return to the ER for any new or worsening symptoms.   Take medications as prescribed. Return to the emergency room for worsening condition or new concerning symptoms. Follow up with your regular doctor. If you don't have a regular doctor use one of the numbers below to establish a primary care doctor.

## 2018-06-03 NOTE — ED Provider Notes (Signed)
[facility] EMERGENCY DEPARTMENT Provider Note   CSN: [id number] Arrival date & time: 06/03/18  1803     History   Chief Complaint Chief Complaint  Patient presents with  . Altered Mental Status    HPI Casey Jennings is a 67 y.o. female with a PMH of dementia presents with altered mental status. Patient was brought by [employer] after walking a few blocks from her house and refusing to go home. [employer] reports patient was agitated and confused. Patient was seen at [facility] for altered mental status and discharged today around 1pm. Patient was diagnosed with a UTI and provided antibiotics. Patient denies any hallucinations currently, but states she "saw people" earlier today. Patient is unable to provide history due to inattentiveness.   Spoke to husband on the phone regarding baseline of patient. Husband reports he was showering when patient left house. Husband states her dementia continues to worsen and she keeps on wandering off on her own. Husband reports he does not have other help available at home at this time. Husband states patient has been eating and drinking less lately.   Level 5 caveat due to AMS.  HPI  Past Medical History:  Diagnosis Date  . Anemia   . Arthritis   . Chronic kidney disease    hx of uti   . Depression   . Diabetes mellitus without complication ([place])   . GERD (gastroesophageal reflux disease)   . Heart murmur   . Hypercalcemia   . Hyperlipidemia   . Hypertension   . Memory loss   . Osteopenia   . Osteoporosis   . UTI (urinary tract infection) 05/14/2018  . Yeast infection    questinable per pt on 05/23/12- to let MD be aware of     Patient Active Problem List   Diagnosis Date Noted  . Dehydration   . Acute encephalopathy 06/02/2018  . UTI (urinary tract infection) 05/14/2018  . Hypertension 05/13/2018  . CKD (chronic kidney disease), stage IV ([place]) 05/13/2018  . Acute lower UTI 05/13/2018  . Acute UTI 05/13/2018  .  Dementia with behavioral disturbance ([place]) 10/31/2017  . Alzheimer's dementia with behavioral disturbance ([place]) 02/25/2017  . Memory loss 12/24/2016  . Visual hallucination 12/24/2016  . Diabetes mellitus type 2 with complications ([place]) 42/35/3614  . Gastroparesis due to DM ([place]) 04/11/2014  . Rheumatoid arthritis ([place]) 04/11/2014  . Cholecystitis 04/10/2014  . Hyperparathyroidism, primary ([place]) 05/12/2012    Past Surgical History:  Procedure Laterality Date  . BACK SURGERY    . BLADDER SUSPENSION    . CARPAL TUNNEL RELEASE    . CESAREAN SECTION    . CHOLECYSTECTOMY N/A 04/11/2014   Procedure: LAPAROSCOPIC CHOLECYSTECTOMY WITH INTRAOPERATIVE CHOLANGIOGRAM;  Surgeon: [name], MD;  Location: [facility] ORS;  Service: General;  Laterality: N/A;  . PARATHYROIDECTOMY  05/27/2012   Procedure: PARATHYROIDECTOMY;  Surgeon: Earnstine Regal, MD;  Location: [facility] ORS;  Service: General;  Laterality: N/A;  . right ankle surgery      due to fracture   . SPINE SURGERY    . TONSILLECTOMY       OB History   None      Home Medications    Prior to Admission medications   Medication Sig Start Date End Date Taking? Authorizing Provider  Apoaequorin (PREVAGEN) 10 MG CAPS Take 1 capsule by mouth daily.    [provider]  aspirin 81 MG tablet Take 81 mg by mouth daily.    [provider]  atorvastatin (LIPITOR) 80 MG tablet Take 80 mg by mouth daily.     [provider]  Calcium Carbonate-Vitamin D (CALCIUM 600 + D PO) Take 600 mg by mouth daily.    [provider]  cefdinir (OMNICEF) 300 MG capsule Take 1 capsule (300 mg total) by mouth 2 (two) times daily for 10 days. 06/03/18 06/13/18  Purohit, Konrad Dolores, MD  cetirizine (ZYRTEC) 10 MG tablet Take 10 mg by mouth daily.    [provider]  Choline Fenofibrate (TRILIPIX) 135 MG capsule Take 135 mg by mouth daily.    [provider]  donepezil (ARICEPT) 10 MG tablet Take 1 tablet (10 mg total) by mouth  at bedtime. 04/29/17   Marcial Pacas, MD  DULoxetine (CYMBALTA) 60 MG capsule Take 60 mg by mouth daily.    [provider]  esomeprazole (NEXIUM) 20 MG capsule Take 20 mg by mouth daily.    [provider]  fluticasone (FLONASE) 50 MCG/ACT nasal spray Place 2 sprays into both nostrils daily as needed for allergies. 05/24/18   [provider]  insulin aspart (NOVOLOG FLEXPEN) 100 UNIT/ML FlexPen Inject 16 Units into the skin 3 (three) times daily with meals. Uses about twice daily, because that is how often she eats    [provider]  levothyroxine (SYNTHROID, LEVOTHROID) 100 MCG tablet Take 50-100 mcg by mouth daily before breakfast. Take 157mcg daily except Sunday when she take 50 mcg    [provider]  memantine (NAMENDA) 10 MG tablet Take 1 tablet (10 mg total) by mouth 2 (two) times daily. 07/30/17   Marcial Pacas, MD  metoCLOPramide (REGLAN) 10 MG tablet Take 10 mg by mouth 3 (three) times daily before meals.     [provider]  metoprolol succinate (TOPROL-XL) 50 MG 24 hr tablet Take 50 mg by mouth daily. Take with or immediately following a meal.    [provider]  Multiple Vitamin (MULTIVITAMIN WITH MINERALS) TABS Take 1 tablet by mouth daily.    [provider]  nitroGLYCERIN (NITROSTAT) 0.4 MG SL tablet Place 0.4 mg under the tongue every 5 (five) minutes as needed for chest pain.    [provider]  Omega-3 Fatty Acids (FISH OIL PO) Take 500 mg by mouth daily.     [provider]  risperiDONE (RISPERDAL) 0.5 MG tablet One in the morning, 2 tablets at night Patient taking differently: 1 in the morning, 2 tablets at night 07/30/17   Marcial Pacas, MD  Vitamin D, Ergocalciferol, (DRISDOL) 50000 UNITS CAPS capsule Take 50,000 Units by mouth. Tuesdays and Fridays.    [provider]    Family History Family History  Problem Relation Age of Onset  . Diabetes Mother   . Heart attack Father     Social  History Social History   Tobacco Use  . Smoking status: Former Smoker    Last attempt to quit: 05/07/1978    Years since quitting: 40.1  . Smokeless tobacco: Never Used  Substance Use Topics  . Alcohol use: Yes    Comment:  1- 2 times per year  . Drug use: No     Allergies   Codeine   Review of Systems Review of Systems  Unable to perform ROS: Dementia     Physical Exam Updated Vital Signs BP (!) 144/56   Pulse 81   Resp 18   SpO2 97%   Physical Exam  Constitutional: She appears well-developed and well-nourished. No distress.  HENT:  Head: Normocephalic and atraumatic.  Eyes: Pupils are equal, round, and reactive to light. EOM are normal.  Neck: Normal range of motion. Neck supple.  Cardiovascular: Normal rate, regular rhythm and normal heart sounds. Exam reveals no gallop and no friction rub.  No murmur heard. Pulmonary/Chest: Effort normal and breath sounds normal. No respiratory distress. She has no wheezes. She has no rales.  Abdominal: Soft. She exhibits no distension. There is no tenderness. There is no guarding.  Musculoskeletal: Normal range of motion.  Neurological: She is alert.  Skin: Skin is warm. No rash noted. She is not diaphoretic. No erythema.  Psychiatric: She has a normal mood and affect. Her speech is normal. She is slowed. She is not agitated and not actively hallucinating. She is inattentive.  Nursing note and vitals reviewed.  Mental Status:  Alert and oriented to person and place. Not able to identify date or president. Pt is unable to give a coherent history. Speech fluent without evidence of aphasia. Able to follow 2 step commands without difficulty.  Cranial Nerves:  II:  Peripheral visual fields grossly normal, pupils equal, round, reactive to light III,IV, VI: ptosis not present, extra-ocular motions intact bilaterally  V,VII: smile symmetric, facial light touch sensation equal VIII: hearing grossly normal to voice  X: uvula elevates  symmetrically  XI: bilateral shoulder shrug symmetric and strong XII: midline tongue extension without fassiculations Motor:  Normal tone. 5/5 in upper and lower extremities bilaterally including strong and equal grip strength and dorsiflexion/plantar flexion Sensory: light touch normal in all extremities.  Cerebellar: normal finger-to-nose with bilateral upper extremities CV: distal pulses palpable throughout     ED Treatments / Results  Labs (all labs ordered are listed, but only abnormal results are displayed) Labs Reviewed - No data to display  EKG None  Radiology Dg Chest 2 View  Result Date: 06/02/2018 CLINICAL DATA:  67 year old female with a history of altered mental status. EXAM: CHEST - 2 VIEW COMPARISON:  05/23/2012 FINDINGS: Cardiomediastinal silhouette unchanged in size and contour. No evidence of central vascular congestion. No pneumothorax or pleural effusion. No confluent airspace disease. No displaced fracture IMPRESSION: Negative for acute cardiopulmonary disease Electronically Signed   By: Corrie Mckusick D.O.   On: 06/02/2018 20:48    Procedures Procedures (including critical care time)  Medications Ordered in ED Medications - No data to display   Initial Impression / Assessment and Plan / ED Course  I have reviewed the triage vital signs and the nursing notes.  Pertinent labs & imaging results that were available during my care of the patient were reviewed by me and considered in my medical decision making (see chart for details).    Patient presents with complaint of altered mental status. Patient nontoxic appearing, in no apparent distress, vitals WNL.   DDX includes, but is not limited to: diabetic ketoacidosis, intracranial hemorrhage, meningitis, sepsis, subarachnoid hemorrhage, subdural hematoma, carbon monoxide poisoning, intoxication, electrolyte abnormality and stroke.  Labs/imaging: not necessary to reorder due to recent labs from [facility]  today.  Assessment/Plan: Suspect symptoms are likely due to Dementia and UTI. Discussed with family the workup performed earlier [facility] at [facility]. Advised family to continue antibiotics for UTI as prescribed. Family requested help with home health services. Will consult case management and social work. Social work provided information about [facility] in the [place] area. Husband states he wants to keep patient at home at this time. RNCM is also working on establishing home health  for the patient.   Doubt need for further emergent work up at this time. I discussed results, treatment plan, need for PCP follow-up, and return precautions to return to the ER including for any other new or worsening symptoms with the patient. Provided opportunity for questions, patient confirmed understanding and is in agreement with plan. I have answered their questions. Discharge instructions concerning home care and prescriptions have been given. The patient is STABLE and is discharged to home in good condition. Encouraged patient to follow up with PCP and have PCP obtain results of this visit in 1 week or sooner if needed.   Findings and plan of care discussed with supervising physician Dr. Venora Maples who personally evaluated and examined this patient.   Final Clinical Impressions(s) / ED Diagnoses   Final diagnoses:  Confusion    ED Discharge Orders         [place]     06/03/18 2049    Face-to-face encounter (required for Medicare/Medicaid patients)    Comments:  [facility] certify that this patient is under my care and that I, or a nurse practitioner or physician's assistant working with me, had a face-to-face encounter that meets the physician face-to-face encounter requirements with this patient on 06/03/2018. The encounter with the patient was in whole, or in part for the following medical condition(s) which is the primary reason for home health care (List medical condition):  worsening dementia and wandering   06/03/18 2049           Julienne Kass 06/03/18 2215    Jola Schmidt, MD 06/05/18 [phone]

## 2018-06-03 NOTE — Progress Notes (Signed)
PHARMACIST - PHYSICIAN ORDER COMMUNICATION  CONCERNING: P&T Medication Policy on Herbal Medications  DESCRIPTION:  This patient's order for:  apoaequorin  has been noted.  This product(s) is classified as an "herbal" or natural product. Due to a lack of definitive safety studies or FDA approval, nonstandard manufacturing practices, plus the potential risk of unknown drug-drug interactions while on inpatient medications, the Pharmacy and Therapeutics Committee does not permit the use of "herbal" or natural products of this type within [facility].   ACTION TAKEN: The pharmacy department is unable to verify this order at this time and your patient has been informed of this safety policy. Please reevaluate patient's clinical condition at discharge and address if the herbal or natural product(s) should be resumed at that time.   Thanks Lawana Pai R 06/03/2018 3:00 AM

## 2018-06-03 NOTE — ED Notes (Signed)
Pt ambulatory to bathroom without any problems 

## 2018-06-04 LAB — URINE CULTURE

## 2018-06-05 ENCOUNTER — Other Ambulatory Visit: Payer: Self-pay

## 2018-06-05 NOTE — Patient Outreach (Signed)
[facility]) Care Management  06/05/2018  Casey Jennings [birth year] [id number]   Telephone Screen  Referral Date: 06/05/18 Referral Source: Urgent-[facility]A UM Dept. Referral Reason: " husband states that member has dementia, was taken to ED by [facility] police dept after walking blocks from home and refusing to go home, dementia worsening, no other help available at home" Insurance: [place] attempt # 1 to patient/spouse. No answer after several rings and unable to leave message.      Plan: RN CM will make outreach attempt to patient within 3-4 business days. RN CM will send unsuccessful outreach letter to patient.    Enzo Montgomery, RN,BSN,CCM [place] Management Telephonic Care Management Coordinator Direct Phone: [phone] Toll Free: [phone] Fax: [phone]

## 2018-06-06 ENCOUNTER — Other Ambulatory Visit: Payer: Self-pay

## 2018-06-06 NOTE — Patient Outreach (Signed)
[facility]) Care Management  06/06/2018  Casey Jennings 06-27-1951 [id number]   Telephone Screen  Referral Date: 06/05/18 Referral Source: Urgent-HTA UM Dept. Referral Reason: " husband states that member has dementia, was taken to ED by [facility] police dept after walking blocks from home and refusing to go home, dementia worsening, no other help available at home" Insurance: HTA   Outreach attempt #2 to patient/spouse. Spoke with spouse(DPR on file). He voices that patient is still able to talk and care on meaningful conversations but gets forgetful so he normally handles her medical affairs. They reside in their home together. Spouse states that patient requires assistance with all ADLs/IADLs. He drives patient to medical appts. He denies any falls within the past year. He voices that patient does not use any assistive deices. Spouse reports that is it becoming increasingly more difficult to manage patient's care in the home. Due to her dementia which he feels is "getting worse" patient is wandering and leaving the home often. He voices that he is sole caregiver and has limited support. Spouse interested in home services that can provide some assistance to patient such as being with her a few hours a day so he can run errands and do other this. Spouse states that given conversation he had with HTA representative he thought these services were covered by HTA. Advised spouse that theses services are not covered and would be an out of pocket expense. He is unsure if they can afford this. Spouse also states that he has "heard the word Medicaid before" but doesn't know if patient qualifies. He is agreeable to [facility] SW referral for possible assistance. Per chart review, patient has PMH of RA,DM, dementia, HTN and CKD. He voices that patient has manages her own blood sugars and they tend to be normal. Last A1C was 5.9(Nov 2019). Patient was recently hospitalized on 05/13/18-05/17/18. (PCP  does TOC). He denies needing any education and/or support in managing chronic conditions. He states that patient is taking about 15 meds. He denies any issues managing and/or affording meds. He voices that patient is followed by PCP-saw last week and neurologist-sees yearly. [facility] services reviewed and discussed with spouse. Verbal consent or services given. Spouse agrees to SW referral.     Plan: RN CM will send [facility] SW referral for possible in home support/assistance resources.  Enzo Montgomery, RN,BSN,CCM [place] Management Telephonic Care Management Coordinator Direct Phone: [phone] Toll Free: [phone] Fax: [phone]

## 2018-06-10 ENCOUNTER — Other Ambulatory Visit: Payer: Self-pay

## 2018-06-10 DIAGNOSIS — Z6827 Body mass index (BMI) 27.0-27.9, adult: Secondary | ICD-10-CM | POA: Diagnosis not present

## 2018-06-10 DIAGNOSIS — E1129 Type 2 diabetes mellitus with other diabetic kidney complication: Secondary | ICD-10-CM | POA: Diagnosis not present

## 2018-06-10 DIAGNOSIS — N390 Urinary tract infection, site not specified: Secondary | ICD-10-CM | POA: Diagnosis not present

## 2018-06-10 DIAGNOSIS — R627 Adult failure to thrive: Secondary | ICD-10-CM | POA: Diagnosis not present

## 2018-06-10 DIAGNOSIS — Z7189 Other specified counseling: Secondary | ICD-10-CM | POA: Diagnosis not present

## 2018-06-10 DIAGNOSIS — F0390 Unspecified dementia without behavioral disturbance: Secondary | ICD-10-CM | POA: Diagnosis not present

## 2018-06-10 NOTE — Patient Outreach (Addendum)
[facility]) Care Management  06/10/2018  Casey Jennings 1951-03-14 [id number]   Initial outreach to patient's spouse, Cypress Hinkson, regarding social work referral for in-home support resources.  Mr. Kilgore reported that patient will receive home health services through [facility].  BSW informed him that CNA services should be covered by Medicare, if ordered by her MD, while in conjunction with skilled services.  BSW informed him that CNA services would have to be paid for out-of-pocket once skilled services are discharged. BSW talked with him about Medicaid but he and patient are significantly over the income limit to qualify.  BSW agreed to send him a list of in-home providers that he can contact regarding cost. BSW inquired about family members or friends that may be able to provide support at a lower cost. He said they have been in communication with a neighbor who is a CNA so this may be an upon discharge of home health services.  Mr. Stephani confirmed that they received additional resources from Inpatient LCSW, Wendelyn Breslow including [employer], Adult Daycare's, and a list of Memory Care Units.  BSW further discussed some of the caregiver programs provided by [employer].  BSW will follow up with Mr. Provencal next week to ensure receipt of resources mailed.   Ronn Melena, BSW Social Worker [phone]

## 2018-06-12 ENCOUNTER — Encounter (HOSPITAL_COMMUNITY): Payer: Self-pay | Admitting: Emergency Medicine

## 2018-06-12 ENCOUNTER — Other Ambulatory Visit: Payer: Self-pay

## 2018-06-12 ENCOUNTER — Emergency Department (HOSPITAL_COMMUNITY)
Admission: EM | Admit: 2018-06-12 | Discharge: 2018-06-13 | Disposition: A | Discharge status: Home / Self Care | Payer: PPO | Attending: Emergency Medicine | Admitting: Emergency Medicine

## 2018-06-12 DIAGNOSIS — F0390 Unspecified dementia without behavioral disturbance: Secondary | ICD-10-CM | POA: Diagnosis not present

## 2018-06-12 DIAGNOSIS — F0281 Dementia in other diseases classified elsewhere with behavioral disturbance: Secondary | ICD-10-CM | POA: Diagnosis not present

## 2018-06-12 DIAGNOSIS — Z794 Long term (current) use of insulin: Secondary | ICD-10-CM | POA: Diagnosis not present

## 2018-06-12 DIAGNOSIS — N184 Chronic kidney disease, stage 4 (severe): Secondary | ICD-10-CM | POA: Insufficient documentation

## 2018-06-12 DIAGNOSIS — E11649 Type 2 diabetes mellitus with hypoglycemia without coma: Secondary | ICD-10-CM | POA: Diagnosis not present

## 2018-06-12 DIAGNOSIS — G309 Alzheimer's disease, unspecified: Secondary | ICD-10-CM | POA: Insufficient documentation

## 2018-06-12 DIAGNOSIS — I129 Hypertensive chronic kidney disease with stage 1 through stage 4 chronic kidney disease, or unspecified chronic kidney disease: Secondary | ICD-10-CM | POA: Insufficient documentation

## 2018-06-12 DIAGNOSIS — Z87891 Personal history of nicotine dependence: Secondary | ICD-10-CM | POA: Diagnosis not present

## 2018-06-12 DIAGNOSIS — E162 Hypoglycemia, unspecified: Secondary | ICD-10-CM

## 2018-06-12 DIAGNOSIS — M069 Rheumatoid arthritis, unspecified: Secondary | ICD-10-CM | POA: Insufficient documentation

## 2018-06-12 DIAGNOSIS — E1165 Type 2 diabetes mellitus with hyperglycemia: Secondary | ICD-10-CM | POA: Diagnosis present

## 2018-06-12 LAB — CBG MONITORING, ED
GLUCOSE-CAPILLARY: 98 mg/dL (ref 70–99)
Glucose-Capillary: 43 mg/dL — CL (ref 70–99)

## 2018-06-12 NOTE — ED Provider Notes (Signed)
[facility] EMERGENCY DEPARTMENT Provider Note   CSN: [id number] Arrival date & time: 06/12/18  2129     History   Chief Complaint Chief Complaint  Patient presents with  . Hyperglycemia    HPI Casey Jennings is a 67 y.o. female with a PMH of Dementia presenting due to hyperglycemia onset 2030 today. Patient's husband is the historian. Husband reports patient ate dinner at around 1930 and appeared to look ill. Patient was concerned and checked blood glucose. Glucose was reported at 209. Husband became concerned and gave her 14 units of Novolog. Husband reports this is the normal dose he gives her in the morning and at night. Husband states wife requested to go to the ER and Husband agreed. Husband denies nausea, vomiting, or abdominal pain. Husband reports wife is at baseline. Patient is unable to give a coherent story. Patient is a poor historian due to her dementia.   Level 5 caveat.   HPI  Past Medical History:  Diagnosis Date  . Anemia   . Arthritis   . Chronic kidney disease    hx of uti   . Depression   . Diabetes mellitus without complication ([place])   . GERD (gastroesophageal reflux disease)   . Heart murmur   . Hypercalcemia   . Hyperlipidemia   . Hypertension   . Memory loss   . Osteopenia   . Osteoporosis   . UTI (urinary tract infection) 05/14/2018  . Yeast infection    questinable per pt on 05/23/12- to let MD be aware of     Patient Active Problem List   Diagnosis Date Noted  . Dehydration   . Acute encephalopathy 06/02/2018  . UTI (urinary tract infection) 05/14/2018  . Hypertension 05/13/2018  . CKD (chronic kidney disease), stage IV ([place]) 05/13/2018  . Acute lower UTI 05/13/2018  . Acute UTI 05/13/2018  . Dementia with behavioral disturbance ([place]) 10/31/2017  . Alzheimer's dementia with behavioral disturbance ([place]) 02/25/2017  . Memory loss 12/24/2016  . Visual hallucination 12/24/2016  . Diabetes mellitus type 2 with  complications ([place]) 40/98/1191  . Gastroparesis due to DM ([place]) 04/11/2014  . Rheumatoid arthritis ([place]) 04/11/2014  . Cholecystitis 04/10/2014  . Hyperparathyroidism, primary ([place]) 05/12/2012    Past Surgical History:  Procedure Laterality Date  . BACK SURGERY    . BLADDER SUSPENSION    . CARPAL TUNNEL RELEASE    . CESAREAN SECTION    . CHOLECYSTECTOMY N/A 04/11/2014   Procedure: LAPAROSCOPIC CHOLECYSTECTOMY WITH INTRAOPERATIVE CHOLANGIOGRAM;  Surgeon: [name], MD;  Location: [facility] ORS;  Service: General;  Laterality: N/A;  . PARATHYROIDECTOMY  05/27/2012   Procedure: PARATHYROIDECTOMY;  Surgeon: Earnstine Regal, MD;  Location: [facility] ORS;  Service: General;  Laterality: N/A;  . right ankle surgery      due to fracture   . SPINE SURGERY    . TONSILLECTOMY       OB History   None      Home Medications    Prior to Admission medications   Medication Sig Start Date End Date Taking? Authorizing Provider  Apoaequorin (PREVAGEN) 10 MG CAPS Take 1 capsule by mouth daily.   Yes [provider]  aspirin 81 MG tablet Take 81 mg by mouth daily.   Yes [provider]  atorvastatin (LIPITOR) 80 MG tablet Take 80 mg by mouth daily.    Yes [provider]  Calcium Carbonate-Vitamin D (CALCIUM 600 + D PO) Take 600 mg by mouth daily.  Yes [provider]  cefdinir (OMNICEF) 300 MG capsule Take 1 capsule (300 mg total) by mouth 2 (two) times daily for 10 days. 06/03/18 06/13/18 Yes Purohit, Konrad Dolores, MD  cetirizine (ZYRTEC) 10 MG tablet Take 10 mg by mouth daily.   Yes [provider]  Choline Fenofibrate (TRILIPIX) 135 MG capsule Take 135 mg by mouth daily.   Yes [provider]  donepezil (ARICEPT) 10 MG tablet Take 1 tablet (10 mg total) by mouth at bedtime. 04/29/17  Yes Marcial Pacas, MD  DULoxetine (CYMBALTA) 60 MG capsule Take 60 mg by mouth daily.   Yes [provider]  esomeprazole (NEXIUM) 20 MG capsule Take 20 mg by mouth daily.    Yes [provider]  fluticasone (FLONASE) 50 MCG/ACT nasal spray Place 2 sprays into both nostrils daily as needed for allergies. 05/24/18  Yes [provider]  insulin aspart (NOVOLOG FLEXPEN) 100 UNIT/ML FlexPen Inject 16 Units into the skin 3 (three) times daily with meals. Uses about twice daily, because that is how often she eats   Yes [provider]  levothyroxine (SYNTHROID, LEVOTHROID) 100 MCG tablet Take 50-100 mcg by mouth daily before breakfast. Take 193mcg daily except Sunday when she take 50 mcg   Yes [provider]  memantine (NAMENDA) 10 MG tablet Take 1 tablet (10 mg total) by mouth 2 (two) times daily. 07/30/17  Yes Marcial Pacas, MD  metoCLOPramide (REGLAN) 10 MG tablet Take 10 mg by mouth 3 (three) times daily before meals.    Yes [provider]  metoprolol succinate (TOPROL-XL) 50 MG 24 hr tablet Take 50 mg by mouth daily. Take with or immediately following a meal.   Yes [provider]  Multiple Vitamin (MULTIVITAMIN WITH MINERALS) TABS Take 1 tablet by mouth daily.   Yes [provider]  nitroGLYCERIN (NITROSTAT) 0.4 MG SL tablet Place 0.4 mg under the tongue every 5 (five) minutes as needed for chest pain.   Yes [provider]  Omega-3 Fatty Acids (FISH OIL PO) Take 500 mg by mouth daily.    Yes [provider]  risperiDONE (RISPERDAL) 0.5 MG tablet One in the morning, 2 tablets at night Patient taking differently: 1 in the morning, 2 tablets at night 07/30/17  Yes Marcial Pacas, MD  Vitamin D, Ergocalciferol, (DRISDOL) 50000 UNITS CAPS capsule Take 50,000 Units by mouth. Tuesdays and Fridays.   Yes [provider]    Family History Family History  Problem Relation Age of Onset  . Diabetes Mother   . Heart attack Father     Social History Social History   Tobacco Use  . Smoking status: Former Smoker    Last attempt to quit: 05/07/1978    Years since quitting: 40.1  . Smokeless  tobacco: Never Used  Substance Use Topics  . Alcohol use: Yes    Comment:  1- 2 times per year  . Drug use: No     Allergies   Codeine   Review of Systems Review of Systems  Unable to perform ROS: Dementia     Physical Exam Updated Vital Signs BP (!) 142/73 (BP Location: Left Arm)   Pulse 89   Temp 98.3 F (36.8 C) (Oral)   Resp 18   Ht 5\' 4"  (1.626 m)   Wt 72.6 kg   SpO2 98%   BMI 27.46 kg/m   Physical Exam  Constitutional: She appears well-developed and well-nourished. No distress.  HENT:  Head: Normocephalic and atraumatic.  Neck: Normal range of motion. Neck supple.  Cardiovascular: Normal rate, regular rhythm and normal heart sounds. Exam reveals no gallop and no friction rub.  No murmur heard. Pulmonary/Chest: Effort normal and breath sounds normal. No respiratory distress. She has no wheezes. She has no rales.  Abdominal: Soft. She exhibits no distension. There is no tenderness. There is no guarding.  Musculoskeletal: Normal range of motion.  Neurological: She is alert.  Skin: Skin is warm. No rash noted. She is not diaphoretic. No erythema.  Psychiatric: She has a normal mood and affect.  Nursing note and vitals reviewed.  Mental Status:  Alert, oriented to person and place, but patient is unable to identify month, year, or president. Patient is unable to provide a coherent history. Speech fluent without evidence of aphasia. Able to follow 2 step commands without difficulty.  Cranial Nerves:  II:  Peripheral visual fields grossly normal, pupils equal, round, reactive to light III,IV, VI: ptosis not present, extra-ocular motions intact bilaterally  V,VII: smile symmetric, facial light touch sensation equal VIII: hearing grossly normal to voice  X: uvula elevates symmetrically  XI: bilateral shoulder shrug symmetric and strong XII: midline tongue extension without fassiculations Motor:  Normal tone. 5/5 in upper and lower extremities bilaterally  including strong and equal grip strength and dorsiflexion/plantar flexion Sensory: light touch normal in all extremities.  Cerebellar: normal finger-to-nose with bilateral upper extremities CV: distal pulses palpable throughout    ED Treatments / Results  Labs (all labs ordered are listed, but only abnormal results are displayed) Labs Reviewed  CBG MONITORING, ED - Abnormal; Notable for the following components:      Result Value   Glucose-Capillary 43 (*)    All other components within normal limits  I-STAT CHEM 8, ED - Abnormal; Notable for the following components:   Potassium 3.0 (*)    Creatinine, Ser 1.40 (*)    Hemoglobin 10.9 (*)    HCT 32.0 (*)    All other components within normal limits  CBG MONITORING, ED - Abnormal; Notable for the following components:   Glucose-Capillary 62 (*)    All other components within normal limits  CBC WITH DIFFERENTIAL/PLATELET  CBC WITH DIFFERENTIAL/PLATELET  CBG MONITORING, ED  CBG MONITORING, ED    EKG None  Radiology No results found.  Procedures Procedures (including critical care time)  Medications Ordered in ED Medications - No data to display   Initial Impression / Assessment and Plan / ED Course  I have reviewed the triage vital signs and the nursing notes.  Pertinent labs & imaging results that were available during my care of the patient were reviewed by me and considered in my medical decision making (see chart for details).  Clinical Course as of Jun 13 41  Thu Jun 12, 2018  2337 Blood glucose was 43. Provided 8oz of orange juice and food. Will recheck blood glucose in 15 minutes.    [AH]  2337 CBG monitoring, ED(!!) [AH]  Fri Jun 13, 2018  0010 CBG monitoring, ED(!) [AH]    Clinical Course User Index [AH] Arville Lime, [place]    Assessment/Plan: Patient presents with complaint of hyperglycemia. Patient nontoxic appearing, in no apparent distress, vitals WNL, stable. Blood glucose was 98 on arrival at  2153. Rechecked blood glucose at 2337 and it was 43.  Provided orange juice and food. Rechecked blood glucose at 1206 and it was 62. Provided more orange juice. Will continue to monitor blood glucose to ensure improvement. Ordered CBC  and Chem 8 to evaluate signs of infection and kidney function. Husband states patient is back to baseline. Discussed hyperglycemia and hypoglycemia with husband. Provided educational materials and encouraged patient to follow up with PCP regarding further diabetes management.   Findings and plan of care discussed with supervising physician Dr. Alvino Chapel who personally evaluated and examined this patient.  At shift change care was transferred to [facility] who will follow pending studies, re-evaluate and determine disposition.    Final Clinical Impressions(s) / ED Diagnoses   Final diagnoses:  Hypoglycemia    ED Discharge Orders    None       Arville Lime, [place] 06/13/18 0042    Davonna Belling, MD 06/13/18 1535

## 2018-06-12 NOTE — ED Triage Notes (Signed)
C/O of high blood sugar. Per husband, pt's sugar increased to 209 after eating supper. Pt requested to come to ER. Pt did receive 20 units at time of CBG.

## 2018-06-13 LAB — I-STAT CHEM 8, ED
BUN: 21 mg/dL (ref 8–23)
CALCIUM ION: 1.31 mmol/L (ref 1.15–1.40)
Chloride: 102 mmol/L (ref 98–111)
Creatinine, Ser: 1.4 mg/dL — ABNORMAL HIGH (ref 0.44–1.00)
GLUCOSE: 76 mg/dL (ref 70–99)
HEMATOCRIT: 32 % — AB (ref 36.0–46.0)
HEMOGLOBIN: 10.9 g/dL — AB (ref 12.0–15.0)
Potassium: 3 mmol/L — ABNORMAL LOW (ref 3.5–5.1)
Sodium: 141 mmol/L (ref 135–145)
TCO2: 30 mmol/L (ref 22–32)

## 2018-06-13 LAB — CBC WITH DIFFERENTIAL/PLATELET
Abs Immature Granulocytes: 0.01 10*3/uL (ref 0.00–0.07)
BASOS ABS: 0 10*3/uL (ref 0.0–0.1)
Basophils Relative: 1 %
EOS PCT: 1 %
Eosinophils Absolute: 0 10*3/uL (ref 0.0–0.5)
HCT: 34.4 % — ABNORMAL LOW (ref 36.0–46.0)
HEMOGLOBIN: 10.8 g/dL — AB (ref 12.0–15.0)
Immature Granulocytes: 0 %
Lymphocytes Relative: 30 %
Lymphs Abs: 2 10*3/uL (ref 0.7–4.0)
MCH: 29.2 pg (ref 26.0–34.0)
MCHC: 31.4 g/dL (ref 30.0–36.0)
MCV: 93 fL (ref 80.0–100.0)
Monocytes Absolute: 0.6 10*3/uL (ref 0.1–1.0)
Monocytes Relative: 9 %
NEUTROS PCT: 59 %
NRBC: 0 % (ref 0.0–0.2)
Neutro Abs: 3.9 10*3/uL (ref 1.7–7.7)
Platelets: 310 10*3/uL (ref 150–400)
RBC: 3.7 MIL/uL — AB (ref 3.87–5.11)
RDW: 13.1 % (ref 11.5–15.5)
WBC: 6.6 10*3/uL (ref 4.0–10.5)

## 2018-06-13 LAB — CBG MONITORING, ED
GLUCOSE-CAPILLARY: 170 mg/dL — AB (ref 70–99)
GLUCOSE-CAPILLARY: 70 mg/dL (ref 70–99)
Glucose-Capillary: 62 mg/dL — ABNORMAL LOW (ref 70–99)

## 2018-06-13 MED ORDER — POTASSIUM CHLORIDE CRYS ER 20 MEQ PO TBCR
40.0000 meq | EXTENDED_RELEASE_TABLET | Freq: Once | ORAL | Status: AC
  Administered 2018-06-13: 40 meq via ORAL
  Filled 2018-06-13: qty 2

## 2018-06-13 NOTE — ED Notes (Signed)
Patient verbalizes understanding of discharge instructions. Opportunity for questioning and answers were provided. Armband removed by staff, pt discharged from ED in wheelchair.  

## 2018-06-13 NOTE — Discharge Instructions (Addendum)
You have been seen today for low blood sugar. Please read and follow all provided instructions.   1. Medications: usual home medications 2. Treatment: rest, drink plenty of fluids 3. Follow Up: Please follow up with your primary doctor in 2 days for discussion of your diagnoses and further evaluation after today's visit; if you do not have a primary care doctor use the resource guide provided to find one; Please return to the ER for any new or worsening symptoms.   Take medications as prescribed. Return to the emergency room for worsening condition or new concerning symptoms. Follow up with your regular doctor. If you don't have a regular doctor use one of the numbers below to establish a primary care doctor.   Emergency Department Resource Guide 1) Find a Doctor and Pay Out of Pocket Although you won't have to find out who is covered by your insurance plan, it is a good idea to ask around and get recommendations. You will then need to call the office and see if the doctor you have chosen will accept you as a new patient and what types of options they offer for patients who are self-pay. Some doctors offer discounts or will set up payment plans for their patients who do not have insurance, but you will need to ask so you aren't surprised when you get to your appointment.  2) Contact Your Local Health Department Not all health departments have doctors that can see patients for sick visits, but many do, so it is worth a call to see if yours does. If you don't know where your local health department is, you can check in your phone book. The CDC also has a tool to help you locate your state's health department, and many state websites also have listings of all of their local health departments.  3) Find a [facility] If your illness is not likely to be very severe or complicated, you may want to try a walk in clinic. These are popping up all over the country in pharmacies, drugstores, and shopping  centers. They're usually staffed by nurse practitioners or physician assistants that have been trained to treat common illnesses and complaints. They're usually fairly quick and inexpensive. However, if you have serious medical issues or chronic medical problems, these are probably not your best option.  No Primary Care Doctor: Call Health Connect at  [phone] - they can help you locate a primary care doctor that  accepts your insurance, provides certain services, etc. Physician Referral [phone]  Emergency Department Resource Guide 1) Find a Doctor and Pay Out of Pocket Although you won't have to find out who is covered by your insurance plan, it is a good idea to ask around and get recommendations. You will then need to call the office and see if the doctor you have chosen will accept you as a new patient and what types of options they offer for patients who are self-pay. Some doctors offer discounts or will set up payment plans for their patients who do not have insurance, but you will need to ask so you aren't surprised when you get to your appointment.  2) Contact Your Local Health Department Not all health departments have doctors that can see patients for sick visits, but many do, so it is worth a call to see if yours does. If you don't know where your local health department is, you can check in your phone book. The CDC also has a tool to help you  locate your state's health department, and many state websites also have listings of all of their local health departments.  3) Find a [facility] If your illness is not likely to be very severe or complicated, you may want to try a walk in clinic. These are popping up all over the country in pharmacies, drugstores, and shopping centers. They're usually staffed by nurse practitioners or physician assistants that have been trained to treat common illnesses and complaints. They're usually fairly quick and inexpensive. However, if you  have serious medical issues or chronic medical problems, these are probably not your best option.  No Primary Care Doctor: Call Health Connect at  [phone] - they can help you locate a primary care doctor that  accepts your insurance, provides certain services, etc. Physician Referral Service- [phone]  Chronic Pain Problems: Organization         Address  Phone   Notes  [facility]  [phone] Patients need to be referred by their primary care doctor.   Medication Assistance: Organization         Address  Phone   Notes  [facility] Medication [facility] [address] [place], [place] [address] [phone] --Must be a resident of [facility] -- Must have NO insurance coverage whatsoever (no Medicaid/ Medicare, etc.) -- The pt. MUST have a primary care doctor that directs their care regularly and follows them in the community   [facility]  [phone]   [employer]  [phone]    Agencies that provide inexpensive medical care: Organization         Address  Phone   Notes  [place]  [phone]   [facility]    [phone]   [facility] [place], [place] [address] [phone]   [place] [address] [place] [phone]   [facility]anned Parenthood    [phone]   [facility]    [phone]   [place] and [place] [address], [place] Phone:  [phone], Fax:  [phone] Hours of Operation:  9 am - 6 pm, M-F.  Also accepts Medicaid/Medicare and self-pay.  [facility] for [place] [address], [address], [place] Phone: [phone], Fax: [phone]. Hours of Operation:  8:30 am - 5:30 pm, M-F.  Also accepts Medicaid and self-pay.  [facility] [place] [address], [place] Phone: [phone]   [place],  [place], [place] [phone], Ext. [phone] Mondays & Thursdays: 7-9 AM.  First 15 patients are seen on a first come, first serve basis.    [place] Providers:  Organization         Address  Phone   Notes  [facility] [address], [place] [phone] Also accepts self-pay patients.  [facility] [address], [place]  [phone]   [place], [address], [place] [phone]   [facility] [address] [place] [phone]   Lucianne Lei [address], [place]   [phone] Only accepts [place] [facility] [place] patients after they have their name applied to their card.   Self-Pay (no insurance) in [facility]:  Organization  Address  Phone   Notes  Sickle Cell Patients, [facility] [place] [phone]   [facility] [place] [phone]   [facility] [place]  [address], [address], [place] [phone]   [facility]/Dr. Osei-Bonsu  [address], Home or [address], [place] [phone] Phone number for both [place] and [place] locations is the same.  Urgent Medical and [facility] [address], [place] [phone]   [facility] [address], [place] or [address] [phone]   [facility] [address] [place] [phone], phone; [phone], fax Sees patients 1st and 3rd Saturday of every month.  Must not qualify for public or private insurance (i.e. Medicaid, Medicare, [place] [employer] Choice, Veterans' Benefits)  Household income should be no more than 200% of the poverty level The clinic cannot treat you if you are pregnant or think you are pregnant  Sexually  transmitted diseases are not treated at the clinic.

## 2018-06-16 DIAGNOSIS — F0390 Unspecified dementia without behavioral disturbance: Secondary | ICD-10-CM | POA: Diagnosis not present

## 2018-06-16 DIAGNOSIS — E1129 Type 2 diabetes mellitus with other diabetic kidney complication: Secondary | ICD-10-CM | POA: Diagnosis not present

## 2018-06-16 DIAGNOSIS — M069 Rheumatoid arthritis, unspecified: Secondary | ICD-10-CM | POA: Diagnosis not present

## 2018-06-16 DIAGNOSIS — E785 Hyperlipidemia, unspecified: Secondary | ICD-10-CM | POA: Diagnosis not present

## 2018-06-16 DIAGNOSIS — D631 Anemia in chronic kidney disease: Secondary | ICD-10-CM | POA: Diagnosis not present

## 2018-06-16 DIAGNOSIS — R627 Adult failure to thrive: Secondary | ICD-10-CM | POA: Diagnosis not present

## 2018-06-16 DIAGNOSIS — F339 Major depressive disorder, recurrent, unspecified: Secondary | ICD-10-CM | POA: Diagnosis not present

## 2018-06-16 DIAGNOSIS — I10 Essential (primary) hypertension: Secondary | ICD-10-CM | POA: Diagnosis not present

## 2018-06-16 DIAGNOSIS — E11319 Type 2 diabetes mellitus with unspecified diabetic retinopathy without macular edema: Secondary | ICD-10-CM | POA: Diagnosis not present

## 2018-06-16 DIAGNOSIS — Z794 Long term (current) use of insulin: Secondary | ICD-10-CM | POA: Diagnosis not present

## 2018-06-16 DIAGNOSIS — N184 Chronic kidney disease, stage 4 (severe): Secondary | ICD-10-CM | POA: Diagnosis not present

## 2018-06-16 DIAGNOSIS — E876 Hypokalemia: Secondary | ICD-10-CM | POA: Diagnosis not present

## 2018-06-20 ENCOUNTER — Other Ambulatory Visit: Payer: Self-pay

## 2018-06-20 ENCOUNTER — Ambulatory Visit: Payer: Self-pay

## 2018-06-20 NOTE — Patient Outreach (Signed)
[facility]) Care Management  06/20/2018  Megean Fabio 07-Jun-1951 [id number]   BSW attempted to contact patient's spouse, Roquel Burgin, to ensure receipt of resources mailed on 06/10/18.  BSW left voicemail message.  Will attempt to reach again within four business days.  Ronn Melena, BSW Social Worker [phone]

## 2018-06-24 ENCOUNTER — Other Ambulatory Visit: Payer: Self-pay

## 2018-06-24 DIAGNOSIS — R350 Frequency of micturition: Secondary | ICD-10-CM | POA: Diagnosis not present

## 2018-06-24 DIAGNOSIS — N3941 Urge incontinence: Secondary | ICD-10-CM | POA: Diagnosis not present

## 2018-06-24 NOTE — Patient Outreach (Signed)
[facility]) Care Management  06/24/2018  Casey Jennings 27-Dec-1950 [id number]   Follow up call to patient's spouse, Casey Jennings, to ensure receipt of resources mailed on 06/10/18.  Casey Jennings confirmed receipt.  Casey Jennings reported that he is considering long term placement for patient because he is not sure that remaining in the home is the safest option for her at this time.  BSW informed him that referral will be placed to LCSW to assist with and further educate about this process.  Ronn Melena, BSW Social Worker [phone]

## 2018-06-26 ENCOUNTER — Other Ambulatory Visit: Payer: Self-pay | Admitting: Licensed Clinical Social Worker

## 2018-06-26 NOTE — Patient Outreach (Signed)
[facility]) Care Management  06/26/2018  Casey Jennings Nov 08, 1950 [id number]  [facility] CSW received new referral for LTC placement and completed initial call to spouse on 06/26/18. Verbal consent received. HIPPA verifications received as well. [facility] CSW introduced self, reason for call and of [facility] services. Family agreeable to services. Spouse reports patient having dementia with behavioral disturbances and that PCP is suggesting LTC placement now for patient and family feel this will be best.  However, family is unsure how they will be able to afford placement as they do not know if they will qualify for Medicaid due to their assets. Spouse reports that it is getting harder to take care of patient because she will refuse to eat and take her medications. Spouse also reports that patient is starting to wander. [facility] CSW provided extensive education on the entire LTC placement process. Spouse was receptive. Spouse reports already having an elderly law attorney and has an upcoming appointments with her next week to discuss and prepare for LTC placement. He reports that he will discuss protection of assets with lawyer as well. Spouse reports that he will ask his CNA neighbor if she can look after patient while he goes to DSS to apply for Medicaid. He reports that he will do this after his meeting with his lawyer. [facility] CSW will send involvement letter to PCP at this time and will follow up with placement in two weeks.   [facility] CM Care Plan Problem One     Most Recent Value  Care Plan Problem One  Need for LTC placement  Role Documenting the Problem One  Clinical Social Worker  Care Plan for Problem One  Active  [facility] Long Term Goal   Pt will be placed in a LTC facility within 90 days  [facility] Long Term Goal Start Date  06/26/18  Interventions for Problem One Long Term Goal  [facility] CSW provided EXTENSIVE education on the entire LTC placement process as well as payment sources for payment. Spouse agreeable  to meet with his lawyer and DSS.  [facility] CM Short Term Goal #1   Spouse will meet with lawyer within 30 days to prepare for LTC placement  [facility] CM Short Term Goal #1 Start Date  06/26/18  Interventions for Short Term Goal #1  Spouse will meet with lawyer and discuss protection of assets and LTC placement.  [facility] CM Short Term Goal #2   Spouse will meet with DSS Medicaid caseworker and apply for LTC placement within 30 days  [facility] CM Short Term Goal #2 Start Date  06/26/18  Interventions for Short Term Goal #2  DSS address and contact information provided to spouse. Spouse was encouraged to take important financial documents with him to apply for Medicaid.     Eula Fried, BSW, MSW, [email or web address] Phone: [phone] Fax: [phone]

## 2018-07-01 DIAGNOSIS — R350 Frequency of micturition: Secondary | ICD-10-CM | POA: Diagnosis not present

## 2018-07-03 ENCOUNTER — Other Ambulatory Visit: Payer: Self-pay | Admitting: Neurology

## 2018-07-07 ENCOUNTER — Other Ambulatory Visit: Payer: Self-pay | Admitting: Licensed Clinical Social Worker

## 2018-07-07 NOTE — Patient Outreach (Signed)
[facility]) Care Management  07/07/2018  Rickiya Picariello 1951-03-21 [id number]  Assessment-CSW completed outreach attempt today to follow up on LTC placement. CSW unable to reach patient's spouse successfully. CSW left a HIPPA compliant voice message encouraging family to return call once available.  Plan-CSW will await return call or complete an additional outreach if needed.  Eula Fried, BSW, MSW, [email or web address] Phone: [phone] Fax: [phone]

## 2018-07-08 DIAGNOSIS — N3941 Urge incontinence: Secondary | ICD-10-CM | POA: Diagnosis not present

## 2018-07-08 DIAGNOSIS — R350 Frequency of micturition: Secondary | ICD-10-CM | POA: Diagnosis not present

## 2018-07-09 ENCOUNTER — Other Ambulatory Visit: Payer: Self-pay | Admitting: Licensed Clinical Social Worker

## 2018-07-09 NOTE — Patient Outreach (Signed)
[facility]) Care Management  07/09/2018  Jossalin Chervenak [birth year] [id number]  [facility] CSW received incoming return call from patient's spouse. HIPPA verifications received. He reports that he went to his attorney's office last week and was informed that he has to spend down some of their assets before patient can qualify for protection of assets as well as for long term care Medicaid. Patient reports that his 401k from his retirement is part of the assets he will have to spend down. Spouse reports that patient seems to be doing better though. She has "settled down more" and is eating/drinking better. Spouse reports that he hired a Public affairs consultant to come in 3-4 hours a day 3-4 times per week which is a great relief for family. Family is unable to follow through with LTC placement yet as they will need to spend down some of their assets which may take some time. Family feel more comfortable with patient in the home now that an aide has been hired. Family agreeable to case closure at this time as family is no longer needing assistance with placement right now. However, family was strongly encouraged to contact this [facility] CSW once family is ready for placement. [facility] CSW will close case at this time. Family appreciative of education, support and assistance provided.  Eula Fried, BSW, MSW, [email or web address] Phone: [phone] Fax: [phone]

## 2018-07-14 ENCOUNTER — Other Ambulatory Visit: Payer: Self-pay | Admitting: Neurology

## 2018-07-15 DIAGNOSIS — N3941 Urge incontinence: Secondary | ICD-10-CM | POA: Diagnosis not present

## 2018-07-15 DIAGNOSIS — R350 Frequency of micturition: Secondary | ICD-10-CM | POA: Diagnosis not present

## 2018-07-22 DIAGNOSIS — N3941 Urge incontinence: Secondary | ICD-10-CM | POA: Diagnosis not present

## 2018-07-22 DIAGNOSIS — R350 Frequency of micturition: Secondary | ICD-10-CM | POA: Diagnosis not present

## 2018-07-24 DIAGNOSIS — N390 Urinary tract infection, site not specified: Secondary | ICD-10-CM | POA: Diagnosis not present

## 2018-07-29 DIAGNOSIS — F0390 Unspecified dementia without behavioral disturbance: Secondary | ICD-10-CM | POA: Diagnosis not present

## 2018-07-29 DIAGNOSIS — Z6824 Body mass index (BMI) 24.0-24.9, adult: Secondary | ICD-10-CM | POA: Diagnosis not present

## 2018-07-29 DIAGNOSIS — E1129 Type 2 diabetes mellitus with other diabetic kidney complication: Secondary | ICD-10-CM | POA: Diagnosis not present

## 2018-07-29 DIAGNOSIS — I10 Essential (primary) hypertension: Secondary | ICD-10-CM | POA: Diagnosis not present

## 2018-07-29 DIAGNOSIS — N184 Chronic kidney disease, stage 4 (severe): Secondary | ICD-10-CM | POA: Diagnosis not present

## 2018-07-29 DIAGNOSIS — Z794 Long term (current) use of insulin: Secondary | ICD-10-CM | POA: Diagnosis not present

## 2018-08-05 DIAGNOSIS — N3941 Urge incontinence: Secondary | ICD-10-CM | POA: Diagnosis not present

## 2018-08-06 ENCOUNTER — Other Ambulatory Visit: Payer: Self-pay | Admitting: Neurology

## 2018-08-07 DIAGNOSIS — R300 Dysuria: Secondary | ICD-10-CM | POA: Diagnosis not present

## 2018-08-12 DIAGNOSIS — N3941 Urge incontinence: Secondary | ICD-10-CM | POA: Diagnosis not present

## 2018-08-19 DIAGNOSIS — N3941 Urge incontinence: Secondary | ICD-10-CM | POA: Diagnosis not present

## 2018-08-19 DIAGNOSIS — R350 Frequency of micturition: Secondary | ICD-10-CM | POA: Diagnosis not present

## 2018-08-25 DIAGNOSIS — N184 Chronic kidney disease, stage 4 (severe): Secondary | ICD-10-CM | POA: Diagnosis not present

## 2018-08-25 DIAGNOSIS — M859 Disorder of bone density and structure, unspecified: Secondary | ICD-10-CM | POA: Diagnosis not present

## 2018-08-25 DIAGNOSIS — E038 Other specified hypothyroidism: Secondary | ICD-10-CM | POA: Diagnosis not present

## 2018-08-25 DIAGNOSIS — D631 Anemia in chronic kidney disease: Secondary | ICD-10-CM | POA: Diagnosis not present

## 2018-08-25 DIAGNOSIS — I10 Essential (primary) hypertension: Secondary | ICD-10-CM | POA: Diagnosis not present

## 2018-08-25 DIAGNOSIS — Z794 Long term (current) use of insulin: Secondary | ICD-10-CM | POA: Diagnosis not present

## 2018-08-25 DIAGNOSIS — E1129 Type 2 diabetes mellitus with other diabetic kidney complication: Secondary | ICD-10-CM | POA: Diagnosis not present

## 2018-08-25 DIAGNOSIS — E559 Vitamin D deficiency, unspecified: Secondary | ICD-10-CM | POA: Diagnosis not present

## 2018-08-25 DIAGNOSIS — F339 Major depressive disorder, recurrent, unspecified: Secondary | ICD-10-CM | POA: Diagnosis not present

## 2018-08-25 DIAGNOSIS — F0390 Unspecified dementia without behavioral disturbance: Secondary | ICD-10-CM | POA: Diagnosis not present

## 2018-08-25 DIAGNOSIS — E11319 Type 2 diabetes mellitus with unspecified diabetic retinopathy without macular edema: Secondary | ICD-10-CM | POA: Diagnosis not present

## 2018-08-25 DIAGNOSIS — E7849 Other hyperlipidemia: Secondary | ICD-10-CM | POA: Diagnosis not present

## 2018-08-26 DIAGNOSIS — R350 Frequency of micturition: Secondary | ICD-10-CM | POA: Diagnosis not present

## 2018-08-26 DIAGNOSIS — N3941 Urge incontinence: Secondary | ICD-10-CM | POA: Diagnosis not present

## 2018-09-02 DIAGNOSIS — R350 Frequency of micturition: Secondary | ICD-10-CM | POA: Diagnosis not present

## 2018-09-02 DIAGNOSIS — N3941 Urge incontinence: Secondary | ICD-10-CM | POA: Diagnosis not present

## 2018-09-09 DIAGNOSIS — N3941 Urge incontinence: Secondary | ICD-10-CM | POA: Diagnosis not present

## 2018-09-09 DIAGNOSIS — R350 Frequency of micturition: Secondary | ICD-10-CM | POA: Diagnosis not present

## 2018-09-16 DIAGNOSIS — N3942 Incontinence without sensory awareness: Secondary | ICD-10-CM | POA: Diagnosis not present

## 2018-09-16 DIAGNOSIS — R350 Frequency of micturition: Secondary | ICD-10-CM | POA: Diagnosis not present

## 2018-09-16 DIAGNOSIS — N3941 Urge incontinence: Secondary | ICD-10-CM | POA: Diagnosis not present

## 2018-09-22 ENCOUNTER — Encounter: Payer: Self-pay | Admitting: Neurology

## 2018-10-28 DIAGNOSIS — F0390 Unspecified dementia without behavioral disturbance: Secondary | ICD-10-CM | POA: Diagnosis not present

## 2018-10-28 DIAGNOSIS — I10 Essential (primary) hypertension: Secondary | ICD-10-CM | POA: Diagnosis not present

## 2018-10-28 DIAGNOSIS — E1129 Type 2 diabetes mellitus with other diabetic kidney complication: Secondary | ICD-10-CM | POA: Diagnosis not present

## 2018-10-28 DIAGNOSIS — N184 Chronic kidney disease, stage 4 (severe): Secondary | ICD-10-CM | POA: Diagnosis not present

## 2018-11-03 ENCOUNTER — Ambulatory Visit: Payer: PPO | Admitting: Nurse Practitioner

## 2018-11-03 ENCOUNTER — Ambulatory Visit: Payer: PPO | Admitting: Neurology

## 2018-12-02 DIAGNOSIS — E1129 Type 2 diabetes mellitus with other diabetic kidney complication: Secondary | ICD-10-CM | POA: Diagnosis not present

## 2018-12-02 DIAGNOSIS — N184 Chronic kidney disease, stage 4 (severe): Secondary | ICD-10-CM | POA: Diagnosis not present

## 2018-12-02 DIAGNOSIS — I10 Essential (primary) hypertension: Secondary | ICD-10-CM | POA: Diagnosis not present

## 2018-12-02 DIAGNOSIS — Z794 Long term (current) use of insulin: Secondary | ICD-10-CM | POA: Diagnosis not present

## 2018-12-02 DIAGNOSIS — F0390 Unspecified dementia without behavioral disturbance: Secondary | ICD-10-CM | POA: Diagnosis not present

## 2019-01-05 ENCOUNTER — Other Ambulatory Visit: Payer: Self-pay | Admitting: Neurology

## 2019-01-12 DIAGNOSIS — N184 Chronic kidney disease, stage 4 (severe): Secondary | ICD-10-CM | POA: Diagnosis not present

## 2019-01-12 DIAGNOSIS — E871 Hypo-osmolality and hyponatremia: Secondary | ICD-10-CM | POA: Diagnosis not present

## 2019-01-12 DIAGNOSIS — H8110 Benign paroxysmal vertigo, unspecified ear: Secondary | ICD-10-CM | POA: Diagnosis not present

## 2019-01-12 DIAGNOSIS — E1129 Type 2 diabetes mellitus with other diabetic kidney complication: Secondary | ICD-10-CM | POA: Diagnosis not present

## 2019-01-12 DIAGNOSIS — R627 Adult failure to thrive: Secondary | ICD-10-CM | POA: Diagnosis not present

## 2019-01-12 DIAGNOSIS — R269 Unspecified abnormalities of gait and mobility: Secondary | ICD-10-CM | POA: Diagnosis not present

## 2019-01-12 DIAGNOSIS — D631 Anemia in chronic kidney disease: Secondary | ICD-10-CM | POA: Diagnosis not present

## 2019-01-12 DIAGNOSIS — I129 Hypertensive chronic kidney disease with stage 1 through stage 4 chronic kidney disease, or unspecified chronic kidney disease: Secondary | ICD-10-CM | POA: Diagnosis not present

## 2019-01-12 DIAGNOSIS — E11319 Type 2 diabetes mellitus with unspecified diabetic retinopathy without macular edema: Secondary | ICD-10-CM | POA: Diagnosis not present

## 2019-01-12 DIAGNOSIS — F0390 Unspecified dementia without behavioral disturbance: Secondary | ICD-10-CM | POA: Diagnosis not present

## 2019-01-12 DIAGNOSIS — E039 Hypothyroidism, unspecified: Secondary | ICD-10-CM | POA: Diagnosis not present

## 2019-01-12 DIAGNOSIS — E785 Hyperlipidemia, unspecified: Secondary | ICD-10-CM | POA: Diagnosis not present

## 2019-03-03 DIAGNOSIS — E1129 Type 2 diabetes mellitus with other diabetic kidney complication: Secondary | ICD-10-CM | POA: Diagnosis not present

## 2019-03-03 DIAGNOSIS — D631 Anemia in chronic kidney disease: Secondary | ICD-10-CM | POA: Diagnosis not present

## 2019-03-03 DIAGNOSIS — N184 Chronic kidney disease, stage 4 (severe): Secondary | ICD-10-CM | POA: Diagnosis not present

## 2019-03-03 DIAGNOSIS — F0390 Unspecified dementia without behavioral disturbance: Secondary | ICD-10-CM | POA: Diagnosis not present

## 2019-03-03 DIAGNOSIS — I10 Essential (primary) hypertension: Secondary | ICD-10-CM | POA: Diagnosis not present

## 2019-03-03 DIAGNOSIS — N390 Urinary tract infection, site not specified: Secondary | ICD-10-CM | POA: Diagnosis not present

## 2019-03-23 ENCOUNTER — Encounter: Payer: Self-pay | Admitting: Podiatry

## 2019-03-23 ENCOUNTER — Other Ambulatory Visit: Payer: Self-pay

## 2019-03-23 ENCOUNTER — Ambulatory Visit: Payer: PPO | Admitting: Podiatry

## 2019-03-23 VITALS — BP 105/61 | HR 75

## 2019-03-23 DIAGNOSIS — M79675 Pain in left toe(s): Secondary | ICD-10-CM

## 2019-03-23 DIAGNOSIS — M79674 Pain in right toe(s): Secondary | ICD-10-CM

## 2019-03-23 DIAGNOSIS — E118 Type 2 diabetes mellitus with unspecified complications: Secondary | ICD-10-CM

## 2019-03-23 DIAGNOSIS — B351 Tinea unguium: Secondary | ICD-10-CM

## 2019-03-23 NOTE — Patient Instructions (Addendum)
Diabetes Mellitus and Foot Care Foot care is an important part of your health, especially when you have diabetes. Diabetes may cause you to have problems because of poor blood flow (circulation) to your feet and legs, which can cause your skin to:  Become thinner and drier.  Break more easily.  Heal more slowly.  Peel and crack. You may also have nerve damage (neuropathy) in your legs and feet, causing decreased feeling in them. This means that you may not notice minor injuries to your feet that could lead to more serious problems. Noticing and addressing any potential problems early is the best way to prevent future foot problems. How to care for your feet Foot hygiene  Wash your feet daily with warm water and mild soap. Do not use hot water. Then, pat your feet and the areas between your toes until they are completely dry. Do not soak your feet as this can dry your skin.  Trim your toenails straight across. Do not dig under them or around the cuticle. File the edges of your nails with an emery board or nail file.  Apply a moisturizing lotion or petroleum jelly to the skin on your feet and to dry, brittle toenails. Use lotion that does not contain alcohol and is unscented. Do not apply lotion between your toes. Shoes and socks  Wear clean socks or stockings every day. Make sure they are not too tight. Do not wear knee-high stockings since they may decrease blood flow to your legs.  Wear shoes that fit properly and have enough cushioning. Always look in your shoes before you put them on to be sure there are no objects inside.  To break in new shoes, wear them for just a few hours a day. This prevents injuries on your feet. Wounds, scrapes, corns, and calluses  Check your feet daily for blisters, cuts, bruises, sores, and redness. If you cannot see the bottom of your feet, use a mirror or ask someone for help.  Do not cut corns or calluses or try to remove them with medicine.  If you  find a minor scrape, cut, or break in the skin on your feet, keep it and the skin around it clean and dry. You may clean these areas with mild soap and water. Do not clean the area with peroxide, alcohol, or iodine.  If you have a wound, scrape, corn, or callus on your foot, look at it several times a day to make sure it is healing and not infected. Check for: ? Redness, swelling, or pain. ? Fluid or blood. ? Warmth. ? Pus or a bad smell. General instructions  Do not cross your legs. This may decrease blood flow to your feet.  Do not use heating pads or hot water bottles on your feet. They may burn your skin. If you have lost feeling in your feet or legs, you may not know this is happening until it is too late.  Protect your feet from hot and cold by wearing shoes, such as at the beach or on hot pavement.  Schedule a complete foot exam at least once a year (annually) or more often if you have foot problems. If you have foot problems, report any cuts, sores, or bruises to your health care provider immediately. Contact a health care provider if:  You have a medical condition that increases your risk of infection and you have any cuts, sores, or bruises on your feet.  You have an injury that is not   healing.  You have redness on your legs or feet.  You feel burning or tingling in your legs or feet.  You have pain or cramps in your legs and feet.  Your legs or feet are numb.  Your feet always feel cold.  You have pain around a toenail. Get help right away if:  You have a wound, scrape, corn, or callus on your foot and: ? You have pain, swelling, or redness that gets worse. ? You have fluid or blood coming from the wound, scrape, corn, or callus. ? Your wound, scrape, corn, or callus feels warm to the touch. ? You have pus or a bad smell coming from the wound, scrape, corn, or callus. ? You have a fever. ? You have a red line going up your leg. Summary  Check your feet every day  for cuts, sores, red spots, swelling, and blisters.  Moisturize feet and legs daily.  Wear shoes that fit properly and have enough cushioning.  If you have foot problems, report any cuts, sores, or bruises to your health care provider immediately.  Schedule a complete foot exam at least once a year (annually) or more often if you have foot problems. This information is not intended to replace advice given to you by your health care provider. Make sure you discuss any questions you have with your health care provider. Document Released: 07/06/2000 Document Revised: 08/21/2017 Document Reviewed: 08/10/2016 Elsevier Patient Education  2020 Elsevier Inc.   Onychomycosis/Fungal Toenails  WHAT IS IT? An infection that lies within the keratin of your nail plate that is caused by a fungus.  WHY ME? Fungal infections affect all ages, sexes, races, and creeds.  There may be many factors that predispose you to a fungal infection such as age, coexisting medical conditions such as diabetes, or an autoimmune disease; stress, medications, fatigue, genetics, etc.  Bottom line: fungus thrives in a warm, moist environment and your shoes offer such a location.  IS IT CONTAGIOUS? Theoretically, yes.  You do not want to share shoes, nail clippers or files with someone who has fungal toenails.  Walking around barefoot in the same room or sleeping in the same bed is unlikely to transfer the organism.  It is important to realize, however, that fungus can spread easily from one nail to the next on the same foot.  HOW DO WE TREAT THIS?  There are several ways to treat this condition.  Treatment may depend on many factors such as age, medications, pregnancy, liver and kidney conditions, etc.  It is best to ask your doctor which options are available to you.  1. No treatment.   Unlike many other medical concerns, you can live with this condition.  However for many people this can be a painful condition and may lead to  ingrown toenails or a bacterial infection.  It is recommended that you keep the nails cut short to help reduce the amount of fungal nail. 2. Topical treatment.  These range from herbal remedies to prescription strength nail lacquers.  About 40-50% effective, topicals require twice daily application for approximately 9 to 12 months or until an entirely new nail has grown out.  The most effective topicals are medical grade medications available through physicians offices. 3. Oral antifungal medications.  With an 80-90% cure rate, the most common oral medication requires 3 to 4 months of therapy and stays in your system for a year as the new nail grows out.  Oral antifungal medications do require   blood work to make sure it is a safe drug for you.  A liver function panel will be performed prior to starting the medication and after the first month of treatment.  It is important to have the blood work performed to avoid any harmful side effects.  In general, this medication safe but blood work is required. 4. Laser Therapy.  This treatment is performed by applying a specialized laser to the affected nail plate.  This therapy is noninvasive, fast, and non-painful.  It is not covered by insurance and is therefore, out of pocket.  The results have been very good with a 80-95% cure rate.  The [facility] is the only practice in the area to offer this therapy. 5. Permanent Nail Avulsion.  Removing the entire nail so that a new nail will not grow back. 

## 2019-03-30 NOTE — Progress Notes (Signed)
Subjective: Casey Jennings presents today referred by Casey Bowen, MD with cc of painful, discolored, thick toenails which interfere with daily activities.  Pain is aggravated when wearing enclosed shoe gear.   She was last seen in our clinic in 2015. She was able to manage her nails at that time, but due to decline in cognition, husband relates she no longer does them. They are too thick and have become painful. Most symptomatic are her great toes which have become deformed.  [name] husband is present during the visit and relates some of her history due to her dx of dementia.   She has been diabetic about 10 years.  Denies any h/o foot wounds.  Denies any numbness, tingling, burning or pins/needles in her feet.  Past Medical History:  Diagnosis Date  . Anemia   . Arthritis   . Chronic kidney disease    hx of uti   . Depression   . Diabetes mellitus without complication ([place])   . GERD (gastroesophageal reflux disease)   . Heart murmur   . Hypercalcemia   . Hyperlipidemia   . Hypertension   . Memory loss   . Osteopenia   . Osteoporosis   . UTI (urinary tract infection) 05/14/2018  . Yeast infection    questinable per pt on 05/23/12- to let MD be aware of      Patient Active Problem List   Diagnosis Date Noted  . Dehydration   . Acute encephalopathy 06/02/2018  . UTI (urinary tract infection) 05/14/2018  . Hypertension 05/13/2018  . CKD (chronic kidney disease), stage IV ([place]) 05/13/2018  . Acute lower UTI 05/13/2018  . Acute UTI 05/13/2018  . History of adenomatous polyp of colon 12/05/2017  . Dementia with behavioral disturbance ([place]) 10/31/2017  . Alzheimer's dementia with behavioral disturbance ([place]) 02/25/2017  . Memory loss 12/24/2016  . Visual hallucination 12/24/2016  . Diabetes mellitus type 2 with complications ([place]) 05/19/2535  . Gastroparesis due to DM ([place]) 04/11/2014  . Rheumatoid arthritis ([place]) 04/11/2014  . Cholecystitis 04/10/2014  .  Hyperparathyroidism, primary ([place]) 05/12/2012     Past Surgical History:  Procedure Laterality Date  . BACK SURGERY    . BLADDER SUSPENSION    . CARPAL TUNNEL RELEASE    . CESAREAN SECTION    . CHOLECYSTECTOMY N/A 04/11/2014   Procedure: LAPAROSCOPIC CHOLECYSTECTOMY WITH INTRAOPERATIVE CHOLANGIOGRAM;  Surgeon: [name], MD;  Location: [facility] ORS;  Service: General;  Laterality: N/A;  . PARATHYROIDECTOMY  05/27/2012   Procedure: PARATHYROIDECTOMY;  Surgeon: Earnstine Regal, MD;  Location: [facility] ORS;  Service: General;  Laterality: N/A;  . right ankle surgery      due to fracture   . SPINE SURGERY    . TONSILLECTOMY        Current Outpatient Medications:  .  Apoaequorin (PREVAGEN) 10 MG CAPS, Take 1 capsule by mouth daily., Disp: , Rfl:  .  aspirin 81 MG tablet, Take 81 mg by mouth daily., Disp: , Rfl:  .  atorvastatin (LIPITOR) 80 MG tablet, Take 80 mg by mouth daily. , Disp: , Rfl:  .  Calcium Carbonate-Vitamin D (CALCIUM 600 + D PO), Take 600 mg by mouth daily., Disp: , Rfl:  .  cetirizine (ZYRTEC) 10 MG tablet, Take 10 mg by mouth daily., Disp: , Rfl:  .  Choline Fenofibrate (TRILIPIX) 135 MG capsule, Take 135 mg by mouth daily., Disp: , Rfl:  .  donepezil (ARICEPT) 10 MG tablet, TAKE 1 TABLET BY MOUTH EVERYDAY  AT BEDTIME, Disp: 90 tablet, Rfl: 3 .  DULoxetine (CYMBALTA) 60 MG capsule, Take 60 mg by mouth daily., Disp: , Rfl:  .  esomeprazole (NEXIUM) 20 MG capsule, Take 20 mg by mouth daily., Disp: , Rfl:  .  fluticasone (FLONASE) 50 MCG/ACT nasal spray, Place 2 sprays into both nostrils daily as needed for allergies., Disp: , Rfl: 2 .  insulin aspart (NOVOLOG FLEXPEN) 100 UNIT/ML FlexPen, Inject 16 Units into the skin 3 (three) times daily with meals. Uses about twice daily, because that is how often she eats, Disp: , Rfl:  .  levothyroxine (SYNTHROID, LEVOTHROID) 100 MCG tablet, Take 50-100 mcg by mouth daily before breakfast. Take 116mcg daily except Sunday when she take 50 mcg, Disp:  , Rfl:  .  memantine (NAMENDA) 10 MG tablet, TAKE 1 TABLET BY MOUTH TWICE A DAY, Disp: 180 tablet, Rfl: 1 .  metoCLOPramide (REGLAN) 10 MG tablet, Take 10 mg by mouth 3 (three) times daily before meals. , Disp: , Rfl:  .  metoprolol succinate (TOPROL-XL) 50 MG 24 hr tablet, Take 50 mg by mouth daily. Take with or immediately following a meal., Disp: , Rfl:  .  Multiple Vitamin (MULTIVITAMIN WITH MINERALS) TABS, Take 1 tablet by mouth daily., Disp: , Rfl:  .  nitroGLYCERIN (NITROSTAT) 0.4 MG SL tablet, Place 0.4 mg under the tongue every 5 (five) minutes as needed for chest pain., Disp: , Rfl:  .  Omega-3 Fatty Acids (FISH OIL PO), Take 500 mg by mouth daily. , Disp: , Rfl:  .  risperiDONE (RISPERDAL) 0.5 MG tablet, ONE IN THE MORNING, 2 TABLETS AT NIGHT, Disp: 270 tablet, Rfl: 1 .  Vitamin D, Ergocalciferol, (DRISDOL) 50000 UNITS CAPS capsule, Take 50,000 Units by mouth. Tuesdays and Fridays., Disp: , Rfl:    Allergies  Allergen Reactions  . Codeine     hallucinations     Social History   Occupational History  . Occupation: Retired  Tobacco Use  . Smoking status: Former Smoker    Quit date: 05/07/1978    Years since quitting: 40.9  . Smokeless tobacco: Never Used  Substance and Sexual Activity  . Alcohol use: Yes    Comment:  1- 2 times per year  . Drug use: No  . Sexual activity: Not on file     Family History  Problem Relation Age of Onset  . Diabetes Mother   . Heart attack Father       There is no immunization history on file for this patient.   Review of systems: Positive Findings in bold print.  Constitutional:  chills, fatigue, fever, sweats, weight change Communication: Optometrist, sign Ecologist, hand writing, iPad/Android device Head: headaches, head injury Eyes: changes in vision, eye pain, glaucoma, cataracts, macular degeneration, diplopia, glare,  light sensitivity, eyeglasses or contacts, blindness Ears nose mouth throat: hearing impaired,  hearing aids,  ringing in ears, deaf, sign language,  vertigo,   nosebleeds,  rhinitis,  cold sores, snoring, swollen glands Cardiovascular: HTN, edema, arrhythmia, pacemaker in place, defibrillator in place, chest pain/tightness, chronic anticoagulation, blood clot, heart failure, MI Peripheral Vascular: leg cramps, varicose veins, blood clots, lymphedema, varicosities Respiratory:  difficulty breathing, denies congestion, SOB, wheezing, cough, emphysema Gastrointestinal: change in appetite or weight, abdominal pain, constipation, diarrhea, nausea, vomiting, vomiting blood, change in bowel habits, abdominal pain, jaundice, rectal bleeding, hemorrhoids, GERD Genitourinary:  nocturia,  pain on urination, polyuria,  blood in urine, Foley catheter, urinary urgency, ESRD on hemodialysis Musculoskeletal: amputation, cramping, stiff  joints, painful joints, decreased joint motion, fractures, OA, gout, hemiplegia, paraplegia, uses cane, wheelchair bound, uses walker, uses rollator Skin: +changes in toenails, color change, dryness, itching, mole changes,  rash, wound(s) Neurological: headaches, numbness in feet, paresthesias in feet, burning in feet, fainting,  seizures, change in speech. denies headaches, memory problems/poor historian, cerebral palsy, weakness, paralysis, CVA, TIA Endocrine: diabetes, hypothyroidism, hyperthyroidism,  goiter, dry mouth, flushing, heat intolerance,  cold intolerance,  excessive thirst, denies polyuria,  nocturia Hematological:  easy bleeding, excessive bleeding, easy bruising, enlarged lymph nodes, on long term blood thinner, history of past transusions Allergy/immunological:  hives, eczema, frequent infections, multiple drug allergies, seasonal allergies, transplant recipient, multiple food allergies Psychiatric:  anxiety, depression, mood disorder, suicidal ideations, hallucinations, insomnia  Objective: Vitals:   03/23/19 1554  BP: 105/61  Pulse: 75    Vascular  Examination: Capillary refill time immediate x 10 digits.  Dorsalis pedis pulses palpable b/l.   Posterior tibial pulses palpable b/l.   No digital hair x 10 digits.  Skin temperature gradient WNL b/l.  Dermatological Examination: Skin with normal turgor, texture and tone b/l.  Toenails 1-5 b/l discolored, thick, dystrophic with subungual debris and pain with palpation to nailbeds due to thickness of nails.  Bilateral great toe nailplates are growing in a retrograde direction towards her proximal nailfold.  Musculoskeletal: Muscle strength 5/5 b/l to all LE muscle groups.  Neurological: Patient unable to comply with commands of neurological examination due to cognition status, but does respond to external noxious stimuli.   Assessment: 1. Painful onychomycosis toenails 1-5 b/l  2. NIDDM  Plan: 1. Discussed onychomycosis and treatment options.  Literature dispensed on today. 2. Toenails 1-5 b/l were debrided in length and girth without iatrogenic bleeding. 3. Patient to continue soft, supportive shoe gear daily. 4. Patient to report any pedal injuries to medical professional immediately. 5. Follow up 3 months.  6. Patient/POA to call should there be a concern in the interim.

## 2019-05-01 DIAGNOSIS — Z23 Encounter for immunization: Secondary | ICD-10-CM | POA: Diagnosis not present

## 2019-05-07 DIAGNOSIS — F0390 Unspecified dementia without behavioral disturbance: Secondary | ICD-10-CM | POA: Diagnosis not present

## 2019-05-07 DIAGNOSIS — D631 Anemia in chronic kidney disease: Secondary | ICD-10-CM | POA: Diagnosis not present

## 2019-05-07 DIAGNOSIS — E11319 Type 2 diabetes mellitus with unspecified diabetic retinopathy without macular edema: Secondary | ICD-10-CM | POA: Diagnosis not present

## 2019-05-07 DIAGNOSIS — Z1331 Encounter for screening for depression: Secondary | ICD-10-CM | POA: Diagnosis not present

## 2019-05-07 DIAGNOSIS — Z0000 Encounter for general adult medical examination without abnormal findings: Secondary | ICD-10-CM | POA: Diagnosis not present

## 2019-05-07 DIAGNOSIS — R627 Adult failure to thrive: Secondary | ICD-10-CM | POA: Diagnosis not present

## 2019-05-07 DIAGNOSIS — M069 Rheumatoid arthritis, unspecified: Secondary | ICD-10-CM | POA: Diagnosis not present

## 2019-05-07 DIAGNOSIS — N1831 Chronic kidney disease, stage 3a: Secondary | ICD-10-CM | POA: Diagnosis not present

## 2019-05-07 DIAGNOSIS — K222 Esophageal obstruction: Secondary | ICD-10-CM | POA: Diagnosis not present

## 2019-05-07 DIAGNOSIS — F339 Major depressive disorder, recurrent, unspecified: Secondary | ICD-10-CM | POA: Diagnosis not present

## 2019-05-07 DIAGNOSIS — E1129 Type 2 diabetes mellitus with other diabetic kidney complication: Secondary | ICD-10-CM | POA: Diagnosis not present

## 2019-05-07 DIAGNOSIS — K3184 Gastroparesis: Secondary | ICD-10-CM | POA: Diagnosis not present

## 2019-06-02 DIAGNOSIS — H52223 Regular astigmatism, bilateral: Secondary | ICD-10-CM | POA: Diagnosis not present

## 2019-06-02 DIAGNOSIS — H524 Presbyopia: Secondary | ICD-10-CM | POA: Diagnosis not present

## 2019-06-02 DIAGNOSIS — H2513 Age-related nuclear cataract, bilateral: Secondary | ICD-10-CM | POA: Diagnosis not present

## 2019-06-02 DIAGNOSIS — E113293 Type 2 diabetes mellitus with mild nonproliferative diabetic retinopathy without macular edema, bilateral: Secondary | ICD-10-CM | POA: Diagnosis not present

## 2019-06-03 DIAGNOSIS — E1129 Type 2 diabetes mellitus with other diabetic kidney complication: Secondary | ICD-10-CM | POA: Diagnosis not present

## 2019-06-03 DIAGNOSIS — I10 Essential (primary) hypertension: Secondary | ICD-10-CM | POA: Diagnosis not present

## 2019-06-03 DIAGNOSIS — N183 Chronic kidney disease, stage 3 unspecified: Secondary | ICD-10-CM | POA: Diagnosis not present

## 2019-06-16 ENCOUNTER — Encounter (HOSPITAL_COMMUNITY): Payer: Self-pay

## 2019-06-16 ENCOUNTER — Inpatient Hospital Stay (HOSPITAL_COMMUNITY)
Admission: EM | Admit: 2019-06-16 | Discharge: 2019-06-20 | DRG: 602 | Disposition: A | Discharge status: Home / Self Care | Payer: PPO | Attending: Internal Medicine | Admitting: Internal Medicine

## 2019-06-16 ENCOUNTER — Emergency Department (HOSPITAL_COMMUNITY): Payer: PPO

## 2019-06-16 ENCOUNTER — Inpatient Hospital Stay (HOSPITAL_COMMUNITY): Payer: PPO

## 2019-06-16 ENCOUNTER — Other Ambulatory Visit: Payer: Self-pay

## 2019-06-16 DIAGNOSIS — Z681 Body mass index (BMI) 19 or less, adult: Secondary | ICD-10-CM

## 2019-06-16 DIAGNOSIS — F419 Anxiety disorder, unspecified: Secondary | ICD-10-CM | POA: Diagnosis present

## 2019-06-16 DIAGNOSIS — E1143 Type 2 diabetes mellitus with diabetic autonomic (poly)neuropathy: Secondary | ICD-10-CM | POA: Diagnosis not present

## 2019-06-16 DIAGNOSIS — E43 Unspecified severe protein-calorie malnutrition: Secondary | ICD-10-CM | POA: Diagnosis present

## 2019-06-16 DIAGNOSIS — Z515 Encounter for palliative care: Secondary | ICD-10-CM | POA: Diagnosis present

## 2019-06-16 DIAGNOSIS — D631 Anemia in chronic kidney disease: Secondary | ICD-10-CM | POA: Diagnosis present

## 2019-06-16 DIAGNOSIS — E785 Hyperlipidemia, unspecified: Secondary | ICD-10-CM | POA: Diagnosis not present

## 2019-06-16 DIAGNOSIS — F0281 Dementia in other diseases classified elsewhere with behavioral disturbance: Secondary | ICD-10-CM | POA: Diagnosis not present

## 2019-06-16 DIAGNOSIS — R413 Other amnesia: Secondary | ICD-10-CM | POA: Diagnosis not present

## 2019-06-16 DIAGNOSIS — N183 Chronic kidney disease, stage 3 unspecified: Secondary | ICD-10-CM | POA: Diagnosis present

## 2019-06-16 DIAGNOSIS — K3184 Gastroparesis: Secondary | ICD-10-CM | POA: Diagnosis not present

## 2019-06-16 DIAGNOSIS — R627 Adult failure to thrive: Secondary | ICD-10-CM | POA: Diagnosis not present

## 2019-06-16 DIAGNOSIS — Z9049 Acquired absence of other specified parts of digestive tract: Secondary | ICD-10-CM

## 2019-06-16 DIAGNOSIS — F1020 Alcohol dependence, uncomplicated: Secondary | ICD-10-CM | POA: Diagnosis not present

## 2019-06-16 DIAGNOSIS — R5383 Other fatigue: Secondary | ICD-10-CM | POA: Diagnosis not present

## 2019-06-16 DIAGNOSIS — F329 Major depressive disorder, single episode, unspecified: Secondary | ICD-10-CM | POA: Diagnosis not present

## 2019-06-16 DIAGNOSIS — R700 Elevated erythrocyte sedimentation rate: Secondary | ICD-10-CM | POA: Diagnosis not present

## 2019-06-16 DIAGNOSIS — E860 Dehydration: Secondary | ICD-10-CM | POA: Diagnosis present

## 2019-06-16 DIAGNOSIS — F03918 Unspecified dementia, unspecified severity, with other behavioral disturbance: Secondary | ICD-10-CM | POA: Diagnosis present

## 2019-06-16 DIAGNOSIS — L03319 Cellulitis of trunk, unspecified: Secondary | ICD-10-CM | POA: Diagnosis present

## 2019-06-16 DIAGNOSIS — M810 Age-related osteoporosis without current pathological fracture: Secondary | ICD-10-CM | POA: Diagnosis present

## 2019-06-16 DIAGNOSIS — Z20828 Contact with and (suspected) exposure to other viral communicable diseases: Secondary | ICD-10-CM | POA: Diagnosis present

## 2019-06-16 DIAGNOSIS — L03312 Cellulitis of back [any part except buttock]: Secondary | ICD-10-CM | POA: Diagnosis not present

## 2019-06-16 DIAGNOSIS — L0501 Pilonidal cyst with abscess: Secondary | ICD-10-CM | POA: Diagnosis not present

## 2019-06-16 DIAGNOSIS — E039 Hypothyroidism, unspecified: Secondary | ICD-10-CM | POA: Diagnosis present

## 2019-06-16 DIAGNOSIS — L0291 Cutaneous abscess, unspecified: Secondary | ICD-10-CM | POA: Diagnosis not present

## 2019-06-16 DIAGNOSIS — L89150 Pressure ulcer of sacral region, unstageable: Secondary | ICD-10-CM | POA: Diagnosis not present

## 2019-06-16 DIAGNOSIS — R7401 Elevation of levels of liver transaminase levels: Secondary | ICD-10-CM | POA: Diagnosis not present

## 2019-06-16 DIAGNOSIS — Z87440 Personal history of urinary (tract) infections: Secondary | ICD-10-CM

## 2019-06-16 DIAGNOSIS — F0391 Unspecified dementia with behavioral disturbance: Secondary | ICD-10-CM | POA: Diagnosis not present

## 2019-06-16 DIAGNOSIS — E892 Postprocedural hypoparathyroidism: Secondary | ICD-10-CM | POA: Diagnosis present

## 2019-06-16 DIAGNOSIS — E440 Moderate protein-calorie malnutrition: Secondary | ICD-10-CM | POA: Diagnosis not present

## 2019-06-16 DIAGNOSIS — Z87891 Personal history of nicotine dependence: Secondary | ICD-10-CM

## 2019-06-16 DIAGNOSIS — N179 Acute kidney failure, unspecified: Secondary | ICD-10-CM | POA: Diagnosis not present

## 2019-06-16 DIAGNOSIS — Z794 Long term (current) use of insulin: Secondary | ICD-10-CM

## 2019-06-16 DIAGNOSIS — R7982 Elevated C-reactive protein (CRP): Secondary | ICD-10-CM | POA: Diagnosis not present

## 2019-06-16 DIAGNOSIS — F0150 Vascular dementia without behavioral disturbance: Secondary | ICD-10-CM | POA: Diagnosis not present

## 2019-06-16 DIAGNOSIS — E118 Type 2 diabetes mellitus with unspecified complications: Secondary | ICD-10-CM | POA: Diagnosis not present

## 2019-06-16 DIAGNOSIS — L89153 Pressure ulcer of sacral region, stage 3: Secondary | ICD-10-CM | POA: Diagnosis present

## 2019-06-16 DIAGNOSIS — K219 Gastro-esophageal reflux disease without esophagitis: Secondary | ICD-10-CM | POA: Diagnosis present

## 2019-06-16 DIAGNOSIS — L03317 Cellulitis of buttock: Secondary | ICD-10-CM | POA: Diagnosis not present

## 2019-06-16 DIAGNOSIS — Z8249 Family history of ischemic heart disease and other diseases of the circulatory system: Secondary | ICD-10-CM

## 2019-06-16 DIAGNOSIS — Z833 Family history of diabetes mellitus: Secondary | ICD-10-CM

## 2019-06-16 DIAGNOSIS — Z981 Arthrodesis status: Secondary | ICD-10-CM

## 2019-06-16 DIAGNOSIS — Z7189 Other specified counseling: Secondary | ICD-10-CM

## 2019-06-16 DIAGNOSIS — Z66 Do not resuscitate: Secondary | ICD-10-CM | POA: Diagnosis present

## 2019-06-16 DIAGNOSIS — N189 Chronic kidney disease, unspecified: Secondary | ICD-10-CM | POA: Diagnosis not present

## 2019-06-16 DIAGNOSIS — G309 Alzheimer's disease, unspecified: Secondary | ICD-10-CM | POA: Diagnosis not present

## 2019-06-16 DIAGNOSIS — Z7982 Long term (current) use of aspirin: Secondary | ICD-10-CM

## 2019-06-16 DIAGNOSIS — I959 Hypotension, unspecified: Secondary | ICD-10-CM | POA: Diagnosis not present

## 2019-06-16 DIAGNOSIS — D649 Anemia, unspecified: Secondary | ICD-10-CM | POA: Diagnosis not present

## 2019-06-16 DIAGNOSIS — L89159 Pressure ulcer of sacral region, unspecified stage: Secondary | ICD-10-CM | POA: Diagnosis not present

## 2019-06-16 DIAGNOSIS — Z79890 Hormone replacement therapy (postmenopausal): Secondary | ICD-10-CM

## 2019-06-16 DIAGNOSIS — E119 Type 2 diabetes mellitus without complications: Secondary | ICD-10-CM | POA: Diagnosis not present

## 2019-06-16 LAB — COMPREHENSIVE METABOLIC PANEL
ALT: 103 U/L — ABNORMAL HIGH (ref 0–44)
AST: 70 U/L — ABNORMAL HIGH (ref 15–41)
Albumin: 3 g/dL — ABNORMAL LOW (ref 3.5–5.0)
Alkaline Phosphatase: 83 U/L (ref 38–126)
Anion gap: 8 (ref 5–15)
BUN: 32 mg/dL — ABNORMAL HIGH (ref 8–23)
CO2: 23 mmol/L (ref 22–32)
Calcium: 9 mg/dL (ref 8.9–10.3)
Chloride: 112 mmol/L — ABNORMAL HIGH (ref 98–111)
Creatinine, Ser: 0.95 mg/dL (ref 0.44–1.00)
GFR calc Af Amer: >60 mL/min (ref >60)
GFR calc non Af Amer: >60 mL/min (ref >60)
Glucose, Bld: 161 mg/dL — ABNORMAL HIGH (ref 70–99)
Potassium: 3.5 mmol/L (ref 3.5–5.1)
Sodium: 143 mmol/L (ref 135–145)
Total Bilirubin: 0.7 mg/dL (ref 0.3–1.2)
Total Protein: 6.2 g/dL — ABNORMAL LOW (ref 6.5–8.1)

## 2019-06-16 LAB — BASIC METABOLIC PANEL
Anion gap: 9 (ref 5–15)
BUN: 37 mg/dL — ABNORMAL HIGH (ref 8–23)
CO2: 25 mmol/L (ref 22–32)
Calcium: 9.4 mg/dL (ref 8.9–10.3)
Chloride: 108 mmol/L (ref 98–111)
Creatinine, Ser: 1.15 mg/dL — ABNORMAL HIGH (ref 0.44–1.00)
GFR calc Af Amer: 56 mL/min — ABNORMAL LOW (ref >60)
GFR calc non Af Amer: 49 mL/min — ABNORMAL LOW (ref >60)
Glucose, Bld: 232 mg/dL — ABNORMAL HIGH (ref 70–99)
Potassium: 3.5 mmol/L (ref 3.5–5.1)
Sodium: 142 mmol/L (ref 135–145)

## 2019-06-16 LAB — CBC
HCT: 34.6 % — ABNORMAL LOW (ref 36.0–46.0)
HCT: 31.3 % — ABNORMAL LOW (ref 36.0–46.0)
Hemoglobin: 11.1 g/dL — ABNORMAL LOW (ref 12.0–15.0)
Hemoglobin: 9.6 g/dL — ABNORMAL LOW (ref 12.0–15.0)
MCH: 30.4 pg (ref 26.0–34.0)
MCH: 30.4 pg (ref 26.0–34.0)
MCHC: 32.1 g/dL (ref 30.0–36.0)
MCHC: 30.7 g/dL (ref 30.0–36.0)
MCV: 94.8 fL (ref 80.0–100.0)
MCV: 99.1 fL (ref 80.0–100.0)
Platelets: 241 10*3/uL (ref 150–400)
Platelets: 216 10*3/uL (ref 150–400)
RBC: 3.65 MIL/uL — ABNORMAL LOW (ref 3.87–5.11)
RBC: 3.16 MIL/uL — ABNORMAL LOW (ref 3.87–5.11)
RDW: 11.9 % (ref 11.5–15.5)
RDW: 11.9 % (ref 11.5–15.5)
WBC: 9.2 10*3/uL (ref 4.0–10.5)
WBC: 7.5 10*3/uL (ref 4.0–10.5)
nRBC: 0 % (ref 0.0–0.2)
nRBC: 0 % (ref 0.0–0.2)

## 2019-06-16 LAB — LACTIC ACID, PLASMA: Lactic Acid, Venous: 1.4 mmol/L (ref 0.5–1.9)

## 2019-06-16 LAB — TSH: TSH: 2.458 u[IU]/mL (ref 0.350–4.500)

## 2019-06-16 LAB — SARS CORONAVIRUS 2 (TAT 6-24 HRS): SARS Coronavirus 2: NEGATIVE

## 2019-06-16 LAB — C-REACTIVE PROTEIN: CRP: 20.7 mg/dL — ABNORMAL HIGH (ref <1.0)

## 2019-06-16 LAB — SEDIMENTATION RATE: Sed Rate: 115 mm/hr — ABNORMAL HIGH (ref 0–22)

## 2019-06-16 MED ORDER — PIPERACILLIN-TAZOBACTAM 3.375 G IVPB: 3.3750 g | Freq: Once | INTRAVENOUS | Status: DC

## 2019-06-16 MED ORDER — METOCLOPRAMIDE HCL 10 MG PO TABS
10.0000 mg | ORAL_TABLET | Freq: Three times a day (TID) | ORAL | Status: DC
  Administered 2019-06-17 – 2019-06-20 (×7): 10 mg via ORAL
  Filled 2019-06-16 (×9): qty 1

## 2019-06-16 MED ORDER — PIPERACILLIN-TAZOBACTAM 3.375 G IVPB 30 MIN: 3.3750 g | Freq: Once | INTRAVENOUS | Status: DC

## 2019-06-16 MED ORDER — MIRTAZAPINE 15 MG PO TABS
7.5000 mg | ORAL_TABLET | Freq: Every day | ORAL | Status: DC
  Administered 2019-06-16 – 2019-06-20 (×4): 7.5 mg via ORAL
  Filled 2019-06-16 (×5): qty 1

## 2019-06-16 MED ORDER — FLUTICASONE PROPIONATE 50 MCG/ACT NA SUSP
2.0000 | Freq: Every day | NASAL | Status: DC | PRN
  Filled 2019-06-16: qty 16

## 2019-06-16 MED ORDER — ASPIRIN 81 MG PO CHEW
81.0000 mg | CHEWABLE_TABLET | Freq: Every day | ORAL | Status: DC
  Administered 2019-06-16 – 2019-06-20 (×4): 81 mg via ORAL
  Filled 2019-06-16 (×5): qty 1

## 2019-06-16 MED ORDER — GADOBUTROL 1 MMOL/ML IV SOLN
5.0000 mL | Freq: Once | INTRAVENOUS | Status: AC | PRN
  Administered 2019-06-16: 5 mL via INTRAVENOUS

## 2019-06-16 MED ORDER — LORAZEPAM 1 MG PO TABS: 1.0000 mg | ORAL_TABLET | ORAL | Status: AC | PRN

## 2019-06-16 MED ORDER — ATORVASTATIN CALCIUM 40 MG PO TABS
80.0000 mg | ORAL_TABLET | Freq: Every day | ORAL | Status: DC
  Administered 2019-06-16 – 2019-06-20 (×4): 80 mg via ORAL
  Filled 2019-06-16 (×5): qty 2

## 2019-06-16 MED ORDER — RISPERIDONE 0.25 MG PO TABS
0.5000 mg | ORAL_TABLET | Freq: Every day | ORAL | Status: DC
  Administered 2019-06-17 – 2019-06-20 (×3): 0.5 mg via ORAL
  Filled 2019-06-16 (×4): qty 2

## 2019-06-16 MED ORDER — LORAZEPAM 2 MG/ML IJ SOLN
1.0000 mg | INTRAMUSCULAR | Status: AC | PRN
  Administered 2019-06-17: 2 mg via INTRAVENOUS
  Filled 2019-06-16: qty 1

## 2019-06-16 MED ORDER — TRAMADOL HCL 50 MG PO TABS: 50.0000 mg | ORAL_TABLET | Freq: Four times a day (QID) | ORAL | Status: DC | PRN

## 2019-06-16 MED ORDER — NITROGLYCERIN 0.4 MG SL SUBL: 0.4000 mg | SUBLINGUAL_TABLET | SUBLINGUAL | Status: DC | PRN

## 2019-06-16 MED ORDER — SODIUM CHLORIDE 0.9 % IV BOLUS
500.0000 mL | Freq: Once | INTRAVENOUS | Status: AC
  Administered 2019-06-16: 500 mL via INTRAVENOUS

## 2019-06-16 MED ORDER — MEMANTINE HCL 10 MG PO TABS
10.0000 mg | ORAL_TABLET | Freq: Two times a day (BID) | ORAL | Status: DC
  Administered 2019-06-16 – 2019-06-20 (×7): 10 mg via ORAL
  Filled 2019-06-16 (×8): qty 1

## 2019-06-16 MED ORDER — SODIUM CHLORIDE 0.9 % IV SOLN
INTRAVENOUS | Status: AC
  Administered 2019-06-16: 20:00:00 via INTRAVENOUS

## 2019-06-16 MED ORDER — THIAMINE HCL 100 MG/ML IJ SOLN
100.0000 mg | Freq: Every day | INTRAMUSCULAR | Status: DC
  Administered 2019-06-19: 100 mg via INTRAVENOUS
  Filled 2019-06-16: qty 2

## 2019-06-16 MED ORDER — ACETAMINOPHEN 325 MG PO TABS
650.0000 mg | ORAL_TABLET | Freq: Four times a day (QID) | ORAL | Status: DC | PRN
  Administered 2019-06-19: 650 mg via ORAL
  Filled 2019-06-16: qty 2

## 2019-06-16 MED ORDER — DONEPEZIL HCL 10 MG PO TABS
10.0000 mg | ORAL_TABLET | Freq: Every day | ORAL | Status: DC
  Administered 2019-06-16 – 2019-06-19 (×4): 10 mg via ORAL
  Filled 2019-06-16 (×4): qty 1

## 2019-06-16 MED ORDER — ADULT MULTIVITAMIN W/MINERALS CH: 1.0000 | ORAL_TABLET | Freq: Every day | ORAL | Status: DC

## 2019-06-16 MED ORDER — PIPERACILLIN-TAZOBACTAM 3.375 G IVPB
3.3750 g | Freq: Three times a day (TID) | INTRAVENOUS | Status: DC
  Administered 2019-06-17 – 2019-06-20 (×10): 3.375 g via INTRAVENOUS
  Filled 2019-06-16 (×12): qty 50

## 2019-06-16 MED ORDER — ONDANSETRON HCL 4 MG/2ML IJ SOLN: 4.0000 mg | Freq: Four times a day (QID) | INTRAMUSCULAR | Status: DC | PRN

## 2019-06-16 MED ORDER — ACETAMINOPHEN 650 MG RE SUPP: 650.0000 mg | Freq: Four times a day (QID) | RECTAL | Status: DC | PRN

## 2019-06-16 MED ORDER — FOLIC ACID 1 MG PO TABS
1.0000 mg | ORAL_TABLET | Freq: Every day | ORAL | Status: DC
  Administered 2019-06-16 – 2019-06-20 (×4): 1 mg via ORAL
  Filled 2019-06-16 (×5): qty 1

## 2019-06-16 MED ORDER — BISACODYL 10 MG RE SUPP: 10.0000 mg | Freq: Every day | RECTAL | Status: DC | PRN

## 2019-06-16 MED ORDER — VITAMIN B-1 100 MG PO TABS
100.0000 mg | ORAL_TABLET | Freq: Every day | ORAL | Status: DC
  Administered 2019-06-16 – 2019-06-20 (×3): 100 mg via ORAL
  Filled 2019-06-16 (×4): qty 1

## 2019-06-16 MED ORDER — ADULT MULTIVITAMIN W/MINERALS CH
1.0000 | ORAL_TABLET | Freq: Every day | ORAL | Status: DC
  Administered 2019-06-16 – 2019-06-20 (×4): 1 via ORAL
  Filled 2019-06-16 (×5): qty 1

## 2019-06-16 MED ORDER — HEPARIN SODIUM (PORCINE) 5000 UNIT/ML IJ SOLN
5000.0000 [IU] | Freq: Three times a day (TID) | INTRAMUSCULAR | Status: DC
  Administered 2019-06-16 – 2019-06-20 (×11): 5000 [IU] via SUBCUTANEOUS
  Filled 2019-06-16 (×12): qty 1

## 2019-06-16 MED ORDER — ONDANSETRON HCL 4 MG PO TABS: 4.0000 mg | ORAL_TABLET | Freq: Four times a day (QID) | ORAL | Status: DC | PRN

## 2019-06-16 MED ORDER — RISPERIDONE 1 MG PO TABS
1.0000 mg | ORAL_TABLET | Freq: Every day | ORAL | Status: DC
  Administered 2019-06-16 – 2019-06-19 (×4): 1 mg via ORAL
  Filled 2019-06-16 (×4): qty 1

## 2019-06-16 MED ORDER — POLYETHYLENE GLYCOL 3350 17 G PO PACK: 17.0000 g | PACK | Freq: Every day | ORAL | Status: DC | PRN

## 2019-06-16 NOTE — Consult Note (Signed)
[facility] Nurse Consult Note: Reason for Consult:Suspected cyst vs abscess in the sacral area.  Consult to surgery has already been placed. [facility] will acquiesce to Surgical opinion. Wound type:Infectious  WOC nursing team will not follow, but will remain available to this patient, the nursing and medical teams.  Please re-consult if needed. Thanks, Maudie Flakes, MSN, RN, [place], Arther Abbott  Pager# [phone]

## 2019-06-16 NOTE — Progress Notes (Signed)
Pharmacy Antibiotic Note  Casey Jennings is a 67 y.o. female presented to the ED on 06/16/2019 with open sacral wound.  Pharmacy is consulted to dose zosyn for broad coverage.   Plan: - zosyn 3.375 gm IV x1 over 30 min, then 3.375 gm IV q8h (infuse over 4 hrs)  ______________________________________  Height: 5\' 4"  (162.6 cm) Weight: 109 lb (49.4 kg) IBW/kg (Calculated) : 54.7  Temp (24hrs), Avg:97.7 F (36.5 C), Min:97.7 F (36.5 C), Max:97.7 F (36.5 C)  Recent Labs  Lab 06/16/19 1149 06/16/19 1334  WBC 9.2  --   CREATININE 1.15*  --   LATICACIDVEN  --  1.4    Estimated Creatinine Clearance: 36 mL/min (A) (by C-G formula based on SCr of 1.15 mg/dL (H)).    Allergies  Allergen Reactions  . Codeine     hallucinations     Thank you for allowing pharmacy to be a part of this patient's care.  Lynelle Doctor 06/16/2019 5:49 PM

## 2019-06-16 NOTE — H&P (Signed)
History and Physical    Breda Bond TKP:[id number] DOB: 1951/05/27 DOA: 06/16/2019  PCP: Reynold Bowen, MD   Patient coming from: Home  Chief Complaint: Sacral Wound, Hypotension, Poor Po Intake   HPI: Casey Jennings is a 68 y.o. female with medical history significant for marijuana due to chronic kidney disease stage III, depression and anxiety, diabetes mellitus with gastroparesis, GERD, hyperlipidemia, hypercalcemia, history of memory loss and Alzheimer's dementia with behavioral disturbances, history of UTIs and yeast infections, history of anemia, as well as other comorbidities who presents with chief complaint of sacral wound that developed since last week.  Patient states that he developed last week sometime and gradually worsened.  Of note she is unable to provide a subjective history to her memory loss and has a difficult time remembering events.  No family is present at bedside but history is primarily taken from the patient and supplemented with the EDPs report.  She presented with an open wound to her sacral region in the superior left gluteal cleft and noticed soreness and pain on Friday with discomfort.  Patient is having pain in the area with hydrogen peroxide use nurse from the wound and presented to the family doctor this morning and there was concern of an abscess with some drainage she was sent to the emergency room because she is also to be somewhat hypotensive.  Husband reported that she has been eating less food and seems less energetic and states that she has lost several pounds and use it recently for both wounds per day and eats very little else.  She is evaluated and found to have a cellulitic area and [facility] was asked to admit this patient and general surgery was consulted.  Imaging did not show osteomyelitis with further imaging was obtained with an MRI which is still pending  ED Course: In the ED she had basic blood work done and was given a 500 mL fluid bolus.  General  surgery was consulted and is x-ray of the sacrum was obtained as well as an MRI which is pending.  Covid testing was pending as well  Review of Systems: As per HPI otherwise all other systems reviewed and negative.    Past Medical History:  Diagnosis Date  . Anemia   . Arthritis   . Chronic kidney disease    hx of uti   . Depression   . Diabetes mellitus without complication ([place])   . GERD (gastroesophageal reflux disease)   . Heart murmur   . Hypercalcemia   . Hyperlipidemia   . Hypertension   . Memory loss   . Osteopenia   . Osteoporosis   . UTI (urinary tract infection) 05/14/2018  . Yeast infection    questinable per pt on 05/23/12- to let MD be aware of    Past Surgical History:  Procedure Laterality Date  . BACK SURGERY    . BLADDER SUSPENSION    . CARPAL TUNNEL RELEASE    . CESAREAN SECTION    . CHOLECYSTECTOMY N/A 04/11/2014   Procedure: LAPAROSCOPIC CHOLECYSTECTOMY WITH INTRAOPERATIVE CHOLANGIOGRAM;  Surgeon: [name], MD;  Location: [facility];  Service: General;  Laterality: N/A;  . PARATHYROIDECTOMY  05/27/2012   Procedure: PARATHYROIDECTOMY;  Surgeon: Earnstine Regal, MD;  Location: [facility];  Service: General;  Laterality: N/A;  . right ankle surgery      due to fracture   . SPINE SURGERY    . TONSILLECTOMY     SOCIAL HISTORY  reports that she  quit smoking about 41 years ago. She has never used smokeless tobacco. She reports current alcohol use. She reports that she does not use drugs.  Allergies  Allergen Reactions  . Codeine     hallucinations   Family History  Problem Relation Age of Onset  . Diabetes Mother   . Heart attack Father    Prior to Admission medications   Medication Sig Start Date End Date Taking? Authorizing Provider  Apoaequorin (PREVAGEN) 10 MG CAPS Take 1 capsule by mouth daily.    [provider]  aspirin 81 MG tablet Take 81 mg by mouth daily.    [provider]  atorvastatin (LIPITOR) 80 MG tablet Take 80 mg by  mouth daily.     [provider]  Calcium Carbonate-Vitamin D (CALCIUM 600 + D PO) Take 600 mg by mouth daily.    [provider]  cetirizine (ZYRTEC) 10 MG tablet Take 10 mg by mouth daily.    [provider]  Choline Fenofibrate (TRILIPIX) 135 MG capsule Take 135 mg by mouth daily.    [provider]  donepezil (ARICEPT) 10 MG tablet TAKE 1 TABLET BY MOUTH EVERYDAY AT BEDTIME 07/14/18   Marcial Pacas, MD  DULoxetine (CYMBALTA) 60 MG capsule Take 60 mg by mouth daily.    [provider]  esomeprazole (NEXIUM) 20 MG capsule Take 20 mg by mouth daily.    [provider]  fluticasone (FLONASE) 50 MCG/ACT nasal spray Place 2 sprays into both nostrils daily as needed for allergies. 05/24/18   [provider]  insulin aspart (NOVOLOG FLEXPEN) 100 UNIT/ML FlexPen Inject 16 Units into the skin 3 (three) times daily with meals. Uses about twice daily, because that is how often she eats    [provider]  levothyroxine (SYNTHROID, LEVOTHROID) 100 MCG tablet Take 50-100 mcg by mouth daily before breakfast. Take 140mg daily except Sunday when she take 50 mcg    [provider]  memantine (NAMENDA) 10 MG tablet TAKE 1 TABLET BY MOUTH TWICE A DAY 07/03/18   YMarcial Pacas MD  metoCLOPramide (REGLAN) 10 MG tablet Take 10 mg by mouth 3 (three) times daily before meals.     [provider]  metoprolol succinate (TOPROL-XL) 50 MG 24 hr tablet Take 50 mg by mouth daily. Take with or immediately following a meal.    [provider]  Multiple Vitamin (MULTIVITAMIN WITH MINERALS) TABS Take 1 tablet by mouth daily.    [provider]  nitroGLYCERIN (NITROSTAT) 0.4 MG SL tablet Place 0.4 mg under the tongue every 5 (five) minutes as needed for chest pain.    [provider]  Omega-3 Fatty Acids (FISH OIL PO) Take 500 mg by mouth daily.     [provider]  risperiDONE (RISPERDAL) 0.5 MG tablet ONE IN THE  MORNING, 2 TABLETS AT NIGHT 08/06/18   YMarcial Pacas MD  Vitamin D, Ergocalciferol, (DRISDOL) 50000 UNITS CAPS capsule Take 50,000 Units by mouth. Tuesdays and Fridays.    [provider]   Physical Exam: Vitals:   06/16/19 1700 06/16/19 1715 06/16/19 1730 06/16/19 1745  BP: 122/68 125/61 126/67 117/65  Pulse: 77 76 78 73  Resp: 16   16  Temp:      TempSrc:      SpO2: 100% 99% 100% 99%  Weight:      Height:       Constitutional: Thin chronically ill-appearing Caucasian female who is slightly cachectic and appears uncomfortable Eyes: Lids  and conjunctivae normal, sclerae anicteric  ENMT: External Ears, Nose appear normal. Grossly normal hearing.  Neck: Appears normal, supple, no cervical masses, normal ROM, no appreciable thyromegaly; no JVD Respiratory: Diminished to auscultation bilaterally, no wheezing, rales, rhonchi or crackles. Normal respiratory effort and patient is not tachypenic. No accessory muscle use.  Cardiovascular: RRR, no murmurs / rubs / gallops. S1 and S2 auscultated. No extremity edema.  Abdomen: Soft, non-tender, non-distended. Bowel sounds positive.  GU: Deferred. Musculoskeletal: No clubbing / cyanosis of digits/nails. No joint deformity upper and lower extremities.  Skin: Has a sacral ulcer with some erythema.  No induration; Warm and dry.  Neurologic: CN 2-12 grossly intact with no focal deficits but does have some tremors.  Romberg sign and cerebellar reflexes not assessed.  Psychiatric: Impaired judgment and insight. Alert and oriented x 2.  Appears anxious mood and appropriate affect.   Labs on Admission: I have personally reviewed following labs and imaging studies  CBC: Recent Labs  Lab 06/16/19 1149  WBC 9.2  HGB 11.1*  HCT 34.6*  MCV 94.8  PLT 498   Basic Metabolic Panel: Recent Labs  Lab 06/16/19 1149  NA 142  K 3.5  CL 108  CO2 25  GLUCOSE 232*  BUN 37*  CREATININE 1.15*  CALCIUM 9.4   GFR: Estimated Creatinine Clearance: 36  mL/min (A) (by C-G formula based on SCr of 1.15 mg/dL (H)). Liver Function Tests: No results for input(s): AST, ALT, ALKPHOS, BILITOT, PROT, ALBUMIN in the last 168 hours. No results for input(s): LIPASE, AMYLASE in the last 168 hours. No results for input(s): AMMONIA in the last 168 hours. Coagulation Profile: No results for input(s): INR, PROTIME in the last 168 hours. Cardiac Enzymes: No results for input(s): CKTOTAL, CKMB, CKMBINDEX, TROPONINI in the last 168 hours. BNP (last 3 results) No results for input(s): PROBNP in the last 8760 hours. HbA1C: No results for input(s): HGBA1C in the last 72 hours. CBG: No results for input(s): GLUCAP in the last 168 hours. Lipid Profile: No results for input(s): CHOL, HDL, LDLCALC, TRIG, CHOLHDL, LDLDIRECT in the last 72 hours. Thyroid Function Tests: No results for input(s): TSH, T4TOTAL, FREET4, T3FREE, THYROIDAB in the last 72 hours. Anemia Panel: No results for input(s): VITAMINB12, FOLATE, FERRITIN, TIBC, IRON, RETICCTPCT in the last 72 hours. Urine analysis:    Component Value Date/Time   COLORURINE YELLOW 06/02/2018 2027   APPEARANCEUR HAZY (A) 06/02/2018 2027   LABSPEC 1.014 06/02/2018 2027   PHURINE 6.0 06/02/2018 2027   GLUCOSEU 150 (A) 06/02/2018 2027   HGBUR NEGATIVE 06/02/2018 2027   BILIRUBINUR NEGATIVE 06/02/2018 2027   KETONESUR NEGATIVE 06/02/2018 2027   PROTEINUR 30 (A) 06/02/2018 2027   UROBILINOGEN 1.0 04/10/2014 1831   NITRITE NEGATIVE 06/02/2018 2027   LEUKOCYTESUR LARGE (A) 06/02/2018 2027   Sepsis Labs: !!!!!!!!!!!!!!!!!!!!!!!!!!!!!!!!!!!!!!!!!!!! '@LABRCNTIP' (procalcitonin:4,lacticidven:4) )No results found for this or any previous visit (from the past 240 hour(s)).   Radiological Exams on Admission: Dg Sacrum/coccyx  Result Date: 06/16/2019 CLINICAL DATA:  Sacral ulcer EXAM: SACRUM AND COCCYX - 2+ VIEW COMPARISON:  CT 11/06/2016 FINDINGS: Sacrum is partially obscured by overlying bowel gas on frontal view.  Sacrum is well demonstrated on lateral view with preservation of the overlying posterior cortex. No cortical destruction or periostitis to suggest acute osteomyelitis. No contour deformity to suggest fracture. There is posterior and interbody fusion at L4-5. SI joints appear intact and unremarkable. IMPRESSION: No acute osseous abnormality. No radiographic evidence of acute osteomyelitis of the sacrum. Electronically Signed  By: Davina Poke M.D.   On: 06/16/2019 13:27   Dg Chest Portable 1 View  Result Date: 06/16/2019 CLINICAL DATA:  Hypotension EXAM: PORTABLE CHEST 1 VIEW COMPARISON:  06/02/2018 FINDINGS: The heart size and mediastinal contours are within normal limits. Calcific aortic knob. Both lungs are clear. The visualized skeletal structures are unremarkable. IMPRESSION: No active disease. Electronically Signed   By: Davina Poke M.D.   On: 06/16/2019 13:23    EKG: Independently reviewed.  Showed a sinus rhythm with a rate of 81 and a QTC of 408.  No evidence of any ST elevation on my interpretation  Assessment/Plan Active Problems:   Diabetes mellitus type 2 with complications ([facility])   Gastroparesis due to DM ([place])   Memory loss   Dementia with behavioral disturbance ([facility])   Cellulitis of sacral region  Sacral cellulitis and wound associated with ulcer -Concerning for deeper infection -Check blood cultures x2 and also Wound culture -X-ray did not show any osteomyelitis -We will check MRI -Consulted general surgery for further evaluation recommendations and they recommended pink dressing, air mattress -Recommending wound consult tomorrow -They have started antibiotics with IV Zosyn and will consider switching to bank and ceftriaxone based on sensitivities but can likely be tolerated to just IV cefazolin -Had an elevated CRP and ESR with an ESR of 115 and a CRP of 20.7  Failure to thrive and poor p.o. intake  -Nutritionist consulted We will need PT and OT to evaluate and  treat-IV fluid hydration -Check TSH -If not improving will consider palliative care consult  Normocytic Anemia -Patient's Hb/Hct was 11.1/34.6 on admission -Check anemia panel in the a.m. -Continue to monitor for signs and symptoms of bleeding; currently no overt bleeding noted -Repeat CBC in a.m.  CKD stage III -Patient BUN/creatinine was 37/1414 -Given a bolus of 500 mL in the ED -Continuing735 mL/h for 1 day -Continue monitor and trend renal function -Avoid nephrotoxic medications, contrast dyes, hypotension and renally dose medication Repeat CMP in a.m.  GERD -Continue PPI  Diabetes mellitus type 2 with gastroparesis associated secondary diabetes mellitus -Continue with metoclopramide  Depression and Anxiety  -Continue with mirtazapine and risperidone  Hyperlipidemia -continue with atorvastatin  History of memory loss and Alzheimer's dementia with behavioral disturbances -Continue with donepezil and memantine -Continue with risperidone  Alcoholism with drinking 2 beers a day -Placed on a CIWA protocol and continue multivitamin, folic acid and thiamine  DVT prophylaxis: Heparin 5,000 units sq q8h Code Status: FULL CODE Family Communication: No family present on Bedside  Disposition Plan: Admitted to inpatient telemetry for further work-up with PT OT Consults called: Discussed the case with infectious diseases, general surgery formally consulted  Admission status: Inpatient telemetry  Severity of Illness: The appropriate patient status for this patient is INPATIENT. Inpatient status is judged to be reasonable and necessary in order to provide the required intensity of service to ensure the patient's safety. The patient's presenting symptoms, physical exam findings, and initial radiographic and laboratory data in the context of their chronic comorbidities is felt to place them at high risk for further clinical deterioration. Furthermore, it is not anticipated that the  patient will be medically stable for discharge from the hospital within 2 midnights of admission. The following factors support the patient status of inpatient.   " The patient's presenting symptoms include sacral pain. " The worrisome physical exam findings include erythema and ulceration which is open " The initial radiographic and laboratory data are worrisome because of elevated Inflammatory  markers. " The chronic co-morbidities are as above  * I certify that at the point of admission it is my clinical judgment that the patient will require inpatient hospital care spanning beyond 2 midnights from the point of admission due to high intensity of service, high risk for further deterioration and high frequency of surveillance required.Kerney Elbe, D.O. [facility] PAGER is on [place]  If 7PM-7AM, please contact night-coverage [email or web address]  06/16/2019, 8:29 PM

## 2019-06-16 NOTE — ED Triage Notes (Addendum)
Per patients son, patient has an abscess to sacral area. Appearing last Friday 06/12/19. Patient went to PCP this morning and reported patient's blood pressure is low and heart rate is up. PCP instructed to send patient to ED.  Patient's son also notes patient has not been eating or drinking well for "two years". States patient lives off of boost and water.

## 2019-06-16 NOTE — ED Notes (Signed)
Coming form PCP-states possible infection cyst on sacrum drainage-states hypotensive, lethargic

## 2019-06-16 NOTE — Consult Note (Signed)
[facility] Surgery Consult Note  Casey [facility] 30-Jun-1951  [id number].    Requesting MD: Raiford Noble Chief Complaint:  Sacral ulcer, hypotension failure to thrive Reason for Consult: sacral ulcer PCP: Reynold Bowen, MD   HPI:  Pt is a 68 y/o woman with hx of dementia and decreased appetite, decreased energy and more lethargic.  Pt complained of a sore area on her sacrum on  06/12/19.  The area was sore but skin was intact on 06/12/19.  On Saturday 06/13/19 the area was red and started draining fluid white-yellow in color per her husband.  She has been eating poorly for a year, she has lost about 70 pounds according to her husband.  Her husband told the ER doctor she has been living on Boost at home.  Her  Glucose was well controlled before this and she was off Insulin.  Since the area on her buttock occurred they have been elevated. Her husband tried to get her into the PCP on Monday, but they could not see her until today.  She was seen by her PCP and BP was in the 90's and she was sent to the ED.  Her husband says that is more or less where she normally runs her BP.  Work up in the ED:  She is afebrile, BP 195-093 systolic range in theED.  She is not tachycardic, sat are good on room air. Her BMI is 18.7.  Labs show a glucose of 232, creatinine of 1.15, which is better than creatinine noted in 2019 labs available on the Epic.  CRP is 20.7, lactate 1.4.  WBC 9.2.  H/H:  11.1/34.6, platelets 241K.    We are ask to see.    ROS: Review of Systems  Constitutional: Positive for malaise/fatigue and weight loss (70 lbs per husband over the last year).  HENT: Negative.   Eyes: Negative.   Respiratory: Negative.   Cardiovascular: Negative.   Gastrointestinal: Negative.   Genitourinary: Negative.        She wears depends garments all the time.  Musculoskeletal: Negative.   Skin: Negative for itching.       Area on her buttocks started bothering her on Friday 06/12/19.  Started  draining on Saturday 11/221/20.  Her husband has been cleaning it with H2O2, and covering with neosporin.    Neurological: Positive for weakness (generalized weakness, but no focal weakness).       Her husband walks her to the BR, says she is a bit weal and unsteady walking alone.  Endo/Heme/Allergies: Negative.   Psychiatric/Behavioral: Negative.     Family History  Problem Relation Age of Onset  . Diabetes Mother   . Heart attack Father   Tobacco: none ETOH:  None Drugs:  None Lives with her husband and he take care of her.   Past Medical History:  Diagnosis Date  . Anemia   . Arthritis   . Chronic kidney disease    hx of uti   . Depression   . Diabetes mellitus without complication ([place])   . GERD (gastroesophageal reflux disease)   . Heart murmur   . Hypercalcemia   . Hyperlipidemia   . Hypertension   . Memory loss   . Osteopenia   . Osteoporosis   . UTI (urinary tract infection) 05/14/2018  . Yeast infection    questinable per pt on 05/23/12- to let MD be aware of     Past Surgical History:  Procedure Laterality Date  . BACK SURGERY    .  BLADDER SUSPENSION    . CARPAL TUNNEL RELEASE    . CESAREAN SECTION    . CHOLECYSTECTOMY N/A 04/11/2014   Procedure: LAPAROSCOPIC CHOLECYSTECTOMY WITH INTRAOPERATIVE CHOLANGIOGRAM;  Surgeon: [name], MD;  Location: [facility] ORS;  Service: General;  Laterality: N/A;  . PARATHYROIDECTOMY  05/27/2012   Procedure: PARATHYROIDECTOMY;  Surgeon: Earnstine Regal, MD;  Location: [facility] ORS;  Service: General;  Laterality: N/A;  . right ankle surgery      due to fracture   . SPINE SURGERY    . TONSILLECTOMY      Social History:  reports that she quit smoking about 41 years ago. She has never used smokeless tobacco. She reports current alcohol use. She reports that she does not use drugs.  Allergies:  Allergies  Allergen Reactions  . Codeine     hallucinations    Prior to Admission medications   Medication Sig Start Date End Date Taking?  Authorizing Provider  Apoaequorin (PREVAGEN) 10 MG CAPS Take 1 capsule by mouth daily.    [provider]  aspirin 81 MG tablet Take 81 mg by mouth daily.    [provider]  atorvastatin (LIPITOR) 80 MG tablet Take 80 mg by mouth daily.     [provider]  Calcium Carbonate-Vitamin D (CALCIUM 600 + D PO) Take 600 mg by mouth daily.    [provider]  cetirizine (ZYRTEC) 10 MG tablet Take 10 mg by mouth daily.    [provider]  Choline Fenofibrate (TRILIPIX) 135 MG capsule Take 135 mg by mouth daily.    [provider]  donepezil (ARICEPT) 10 MG tablet TAKE 1 TABLET BY MOUTH EVERYDAY AT BEDTIME 07/14/18   Marcial Pacas, MD  DULoxetine (CYMBALTA) 60 MG capsule Take 60 mg by mouth daily.    [provider]  esomeprazole (NEXIUM) 20 MG capsule Take 20 mg by mouth daily.    [provider]  fluticasone (FLONASE) 50 MCG/ACT nasal spray Place 2 sprays into both nostrils daily as needed for allergies. 05/24/18   [provider]  insulin aspart (NOVOLOG FLEXPEN) 100 UNIT/ML FlexPen Inject 16 Units into the skin 3 (three) times daily with meals. Uses about twice daily, because that is how often she eats    [provider]  levothyroxine (SYNTHROID, LEVOTHROID) 100 MCG tablet Take 50-100 mcg by mouth daily before breakfast. Take 135mcg daily except Sunday when she take 50 mcg    [provider]  memantine (NAMENDA) 10 MG tablet TAKE 1 TABLET BY MOUTH TWICE A DAY 07/03/18   Marcial Pacas, MD  metoCLOPramide (REGLAN) 10 MG tablet Take 10 mg by mouth 3 (three) times daily before meals.     [provider]  metoprolol succinate (TOPROL-XL) 50 MG 24 hr tablet Take 50 mg by mouth daily. Take with or immediately following a meal.    [provider]  Multiple Vitamin (MULTIVITAMIN WITH MINERALS) TABS Take 1 tablet by mouth daily.    [provider]  nitroGLYCERIN (NITROSTAT) 0.4 MG SL tablet  Place 0.4 mg under the tongue every 5 (five) minutes as needed for chest pain.    [provider]  risperiDONE (RISPERDAL) 0.5 MG tablet ONE IN THE MORNING, 2 TABLETS AT NIGHT 08/06/18   Marcial Pacas, MD     Blood pressure 116/60, pulse 73, temperature 97.7 F (36.5 C), temperature source Oral, resp. rate 16, height 5\' 4"  (1.626 m), weight 49.4 kg, SpO2 98 %. Physical Exam: Physical Exam Constitutional:  General: She is not in acute distress.    Appearance: Normal appearance. She is ill-appearing (she is a chronically ill appearing woman.). She is not toxic-appearing or diaphoretic.  HENT:     Head: Normocephalic and atraumatic.     Mouth/Throat:     Mouth: Mucous membranes are dry.     Pharynx: No oropharyngeal exudate or posterior oropharyngeal erythema.  Eyes:     General: No scleral icterus.    Conjunctiva/sclera: Conjunctivae normal.     Comments: Pupils are equal  Neck:     Musculoskeletal: No neck rigidity or muscular tenderness.     Vascular: No carotid bruit.  Cardiovascular:     Rate and Rhythm: Normal rate and regular rhythm.     Pulses: Normal pulses.     Heart sounds: Murmur present.  Pulmonary:     Effort: Pulmonary effort is normal. No respiratory distress.     Breath sounds: Normal breath sounds. No stridor. No wheezing, rhonchi or rales.  Chest:     Chest wall: No tenderness.  Abdominal:     General: Abdomen is flat. Bowel sounds are normal. There is no distension.     Palpations: Abdomen is soft. There is no mass.     Tenderness: There is no abdominal tenderness. There is no right CVA tenderness, left CVA tenderness, guarding or rebound.     Hernia: No hernia is present.  Genitourinary:    Comments: She has an area of cellulitis right buttocks ~4 x 10 cm. There is within that cellulitis are area ~ 2 x 6 cm of skin break down, stage 2.  She is tender on the right around the area of cellulitis, currently no drainage present.  No tenderness around the  rectum or the left buttocks.   Musculoskeletal:        General: No swelling or tenderness.  Lymphadenopathy:     Cervical: No cervical adenopathy.  Skin:    General: Skin is warm and dry.     Capillary Refill: Capillary refill takes less than 2 seconds.  Neurological:     General: No focal deficit present.     Mental Status: She is alert. She is disoriented.     Cranial Nerves: No cranial nerve deficit.     Comments: She cannot tell me where she is.  She said she could but then didn't know.  He husband answers all the questions for her.  She doesn't volunteer any information.    Psychiatric:        Mood and Affect: Mood normal.        Behavior: Behavior normal.     Comments: Pt is perfectly happy listening, can answer, but does not unless prompted to do so.       Results for orders placed or performed during the [facility] encounter of 06/16/19 (from the past 48 hour(s))  Basic metabolic panel     Status: Abnormal   Collection Time: 06/16/19 11:49 AM  Result Value Ref Range   Sodium 142 135 - 145 mmol/L   Potassium 3.5 3.5 - 5.1 mmol/L   Chloride 108 98 - 111 mmol/L   CO2 25 22 - 32 mmol/L   Glucose, Bld 232 (H) 70 - 99 mg/dL   BUN 37 (H) 8 - 23 mg/dL   Creatinine, Ser 1.15 (H) 0.44 - 1.00 mg/dL   Calcium 9.4 8.9 - 10.3 mg/dL   GFR calc non Af Amer 49 (L) >60 mL/min   GFR calc Af Amer 56 (L) >  60 mL/min   Anion gap 9 5 - 15    Comment: Performed at [facility], [place] [address] [place], [place] [address]  CBC     Status: Abnormal   Collection Time: 06/16/19 11:49 AM  Result Value Ref Range   WBC 9.2 4.0 - 10.5 K/uL   RBC 3.65 (L) 3.87 - 5.11 MIL/uL   Hemoglobin 11.1 (L) 12.0 - 15.0 g/dL   HCT 34.6 (L) 36.0 - 46.0 %   MCV 94.8 80.0 - 100.0 fL   MCH 30.4 26.0 - 34.0 pg   MCHC 32.1 30.0 - 36.0 g/dL   RDW 11.9 11.5 - 15.5 %   Platelets 241 150 - 400 K/uL   nRBC 0.0 0.0 - 0.2 %    Comment: Performed at [facility], [place] [address],  [place], [place] [address]  Sedimentation rate     Status: Abnormal   Collection Time: 06/16/19 11:49 AM  Result Value Ref Range   Sed Rate 115 (H) 0 - 22 mm/hr    Comment: Performed at [facility], [place] [address], [place], [place] [address]  Lactic acid, plasma     Status: None   Collection Time: 06/16/19  1:34 PM  Result Value Ref Range   Lactic Acid, Venous 1.4 0.5 - 1.9 mmol/L    Comment: Performed at [facility], [place] [address] [place], [place] [address]  C-reactive protein     Status: Abnormal   Collection Time: 06/16/19  1:34 PM  Result Value Ref Range   CRP 20.7 (H) <1.0 mg/dL    Comment: Performed at [facility], [place] [address] [place], [place] [address]   Dg Sacrum/coccyx  Result Date: 06/16/2019 CLINICAL DATA:  Sacral ulcer EXAM: SACRUM AND COCCYX - 2+ VIEW COMPARISON:  CT 11/06/2016 FINDINGS: Sacrum is partially obscured by overlying bowel gas on frontal view. Sacrum is well demonstrated on lateral view with preservation of the overlying posterior cortex. No cortical destruction or periostitis to suggest acute osteomyelitis. No contour deformity to suggest fracture. There is posterior and interbody fusion at L4-5. SI joints appear intact and unremarkable. IMPRESSION: No acute osseous abnormality. No radiographic evidence of acute osteomyelitis of the sacrum. Electronically Signed   By: Davina Poke M.D.   On: 06/16/2019 13:27   Dg Chest Portable 1 View  Result Date: 06/16/2019 CLINICAL DATA:  Hypotension EXAM: PORTABLE CHEST 1 VIEW COMPARISON:  06/02/2018 FINDINGS: The heart size and mediastinal contours are within normal limits. Calcific aortic knob. Both lungs are clear. The visualized skeletal structures are unremarkable. IMPRESSION: No active disease. Electronically Signed   By: Davina Poke M.D.   On: 06/16/2019 13:23   . sodium chloride        Assessment/Plan Type II diabetes Dementia/memory loss Hx  of hypertension Hyperlipidemia Hypothyroid/hx of parathyroidectomy CKD Hx GERD Severe Malnutrition Hx cholecystectomy   Sacral ulcer  FEN: IV fluids/currently NPO. ID: none currently DVT:  Pending Follow up:  TBD  Plan:  I would clean site with soap and water, Dry pink dressing, air mattress, Wound care consult tomorrow.  We will see again after the MRI, and follow with you.  I did not see any drainable abscess on my exam.   Earnstine Regal [facility] 06/16/2019, 3:35 PM Please see Amion for pager number during day hours 7:00am-4:30pm

## 2019-06-16 NOTE — ED Provider Notes (Signed)
[facility] DEPT Provider Note   CSN: [id number] Arrival date & time: 06/16/19  1122     History   Chief Complaint No chief complaint on file.   HPI Casey Jennings is a 68 y.o. female.     HPI  Patient presents with husband at bedside.  History primarily from patient's husband.  Patient presents with open wound to the sacral region superior to the gluteal cleft.  Per husband at bedside patient states that she felt sore on Friday and was uncomfortable sitting.  Saturday she showed her husband that there was a painful sore on her sacrum with intact skin.  Over the past several days patient is eating less food and seems less energetic.  Husband states that she usually drinks 4 boost meals per day and eats little else states that she is down only been consuming 2 beers per day.    Husband states that over the past 2 years she has been slowly decreasing in appetite and ability.  Patient has history of dementia and appears to be at baseline mentation per husband but does have decreased energy and appears more lethargic.  Between Saturday and today husband states that he cleaned the area with hydroperoxide and use Neosporin on the wound.  Patient presented to the family doctor today with concern that there was an abscess that when he drainage.    At [place] office patient blood pressure was found to be low, per husband at bedside it was 90 something systolic.  Per husband she has history of low blood pressures which is not entirely abnormal for her.  Past Medical History:  Diagnosis Date  . Anemia   . Arthritis   . Chronic kidney disease    hx of uti   . Depression   . Diabetes mellitus without complication ([place])   . GERD (gastroesophageal reflux disease)   . Heart murmur   . Hypercalcemia   . Hyperlipidemia   . Hypertension   . Memory loss   . Osteopenia   . Osteoporosis   . UTI (urinary tract infection) 05/14/2018  . Yeast infection    questinable per pt on 05/23/12- to let MD be aware of     Patient Active Problem List   Diagnosis Date Noted  . Cellulitis of sacral region 06/16/2019  . Dehydration   . Acute encephalopathy 06/02/2018  . UTI (urinary tract infection) 05/14/2018  . Hypertension 05/13/2018  . CKD (chronic kidney disease), stage IV ([place]) 05/13/2018  . Acute lower UTI 05/13/2018  . Acute UTI 05/13/2018  . History of adenomatous polyp of colon 12/05/2017  . Dementia with behavioral disturbance ([place]) 10/31/2017  . Alzheimer's dementia with behavioral disturbance ([place]) 02/25/2017  . Memory loss 12/24/2016  . Visual hallucination 12/24/2016  . Diabetes mellitus type 2 with complications ([place]) 51/88/4166  . Gastroparesis due to DM ([place]) 04/11/2014  . Rheumatoid arthritis ([place]) 04/11/2014  . Cholecystitis 04/10/2014  . Hyperparathyroidism, primary ([place]) 05/12/2012    Past Surgical History:  Procedure Laterality Date  . BACK SURGERY    . BLADDER SUSPENSION    . CARPAL TUNNEL RELEASE    . CESAREAN SECTION    . CHOLECYSTECTOMY N/A 04/11/2014   Procedure: LAPAROSCOPIC CHOLECYSTECTOMY WITH INTRAOPERATIVE CHOLANGIOGRAM;  Surgeon: [name], MD;  Location: [facility];  Service: General;  Laterality: N/A;  . PARATHYROIDECTOMY  05/27/2012   Procedure: PARATHYROIDECTOMY;  Surgeon: Earnstine Regal, MD;  Location: [facility];  Service: General;  Laterality: N/A;  . right  ankle surgery      due to fracture   . SPINE SURGERY    . TONSILLECTOMY       OB History   No obstetric history on file.      Home Medications    Prior to Admission medications   Medication Sig Start Date End Date Taking? Authorizing Provider  Apoaequorin (PREVAGEN) 10 MG CAPS Take 1 capsule by mouth daily.    [provider]  aspirin 81 MG tablet Take 81 mg by mouth daily.    [provider]  atorvastatin (LIPITOR) 80 MG tablet Take 80 mg by mouth daily.     [provider]  Calcium Carbonate-Vitamin D (CALCIUM 600  + D PO) Take 600 mg by mouth daily.    [provider]  cetirizine (ZYRTEC) 10 MG tablet Take 10 mg by mouth daily.    [provider]  Choline Fenofibrate (TRILIPIX) 135 MG capsule Take 135 mg by mouth daily.    [provider]  donepezil (ARICEPT) 10 MG tablet TAKE 1 TABLET BY MOUTH EVERYDAY AT BEDTIME 07/14/18   Marcial Pacas, MD  DULoxetine (CYMBALTA) 60 MG capsule Take 60 mg by mouth daily.    [provider]  esomeprazole (NEXIUM) 20 MG capsule Take 20 mg by mouth daily.    [provider]  fluticasone (FLONASE) 50 MCG/ACT nasal spray Place 2 sprays into both nostrils daily as needed for allergies. 05/24/18   [provider]  insulin aspart (NOVOLOG FLEXPEN) 100 UNIT/ML FlexPen Inject 16 Units into the skin 3 (three) times daily with meals. Uses about twice daily, because that is how often she eats    [provider]  levothyroxine (SYNTHROID, LEVOTHROID) 100 MCG tablet Take 50-100 mcg by mouth daily before breakfast. Take 181mg daily except Sunday when she take 50 mcg    [provider]  memantine (NAMENDA) 10 MG tablet TAKE 1 TABLET BY MOUTH TWICE A DAY 07/03/18   YMarcial Pacas MD  metoCLOPramide (REGLAN) 10 MG tablet Take 10 mg by mouth 3 (three) times daily before meals.     [provider]  metoprolol succinate (TOPROL-XL) 50 MG 24 hr tablet Take 50 mg by mouth daily. Take with or immediately following a meal.    [provider]  Multiple Vitamin (MULTIVITAMIN WITH MINERALS) TABS Take 1 tablet by mouth daily.    [provider]  nitroGLYCERIN (NITROSTAT) 0.4 MG SL tablet Place 0.4 mg under the tongue every 5 (five) minutes as needed for chest pain.    [provider]  Omega-3 Fatty Acids (FISH OIL PO) Take 500 mg by mouth daily.     [provider]  risperiDONE (RISPERDAL) 0.5 MG tablet ONE IN THE MORNING, 2 TABLETS AT NIGHT 08/06/18   YMarcial Pacas MD  Vitamin D, Ergocalciferol,  (DRISDOL) 50000 UNITS CAPS capsule Take 50,000 Units by mouth. Tuesdays and Fridays.    [provider]    Family History Family History  Problem Relation Age of Onset  . Diabetes Mother   . Heart attack Father     Social History Social History   Tobacco Use  . Smoking status: Former Smoker    Quit date: 05/07/1978    Years since quitting: 41.1  . Smokeless tobacco: Never Used  Substance Use Topics  . Alcohol use: Yes    Comment:  1- 2 times per year  . Drug use: No     Allergies   Codeine   Review of  Systems Review of Systems  Constitutional: Positive for appetite change.  HENT: Negative for congestion and sore throat.   Respiratory: Negative for shortness of breath.   Cardiovascular: Negative for chest pain.  Gastrointestinal: Negative for abdominal pain.  Genitourinary: Negative for dysuria.  Skin: Positive for color change and wound.  Neurological: Negative for syncope.  Psychiatric/Behavioral: Negative for behavioral problems.     Physical Exam Updated Vital Signs BP 116/60   Pulse 73   Temp 97.7 F (36.5 C) (Oral)   Resp 16   Ht 5' 4" (1.626 m)   Wt 49.4 kg   SpO2 98%   BMI 18.71 kg/m   Physical Exam Vitals signs and nursing note reviewed.  Constitutional:      General: She is not in acute distress.    Appearance: Normal appearance. She is not ill-appearing.     Comments: Patient is laying in bed in no acute distress .  She is able answer questions and follow commands however she does so very slowly. Patient appears chronically under nourished   HENT:     Head: Normocephalic and atraumatic.     Nose: Nose normal.  Eyes:     General: No scleral icterus.       Right eye: No discharge.        Left eye: No discharge.     Conjunctiva/sclera: Conjunctivae normal.  Neck:     Musculoskeletal: Normal range of motion.  Cardiovascular:     Rate and Rhythm: Normal rate and regular rhythm.     Pulses: Normal pulses.     Heart sounds:  Normal heart sounds.  Pulmonary:     Effort: Pulmonary effort is normal. No respiratory distress.     Breath sounds: No stridor. No wheezing.  Abdominal:     Palpations: Abdomen is soft.     Tenderness: There is no abdominal tenderness.  Musculoskeletal:     Right lower leg: No edema.     Left lower leg: No edema.  Skin:    General: Skin is warm and dry.     Capillary Refill: Capillary refill takes less than 2 seconds.     Comments: 3.5 cm stage III decubitus ulcer over the sacrum superior to the gluteal cleft.  No drainage present.  Appears clean.  There is surrounding indurated tissue that is red slightly warm to touch and tender to palpation.  Neurological:     Mental Status: She is alert and oriented to person, place, and time. Mental status is at baseline.  Psychiatric:        Mood and Affect: Mood normal.        Behavior: Behavior normal.           ED Treatments / Results  Labs (all labs ordered are listed, but only abnormal results are displayed) Labs Reviewed  BASIC METABOLIC PANEL - Abnormal; Notable for the following components:      Result Value   Glucose, Bld 232 (*)    BUN 37 (*)    Creatinine, Ser 1.15 (*)    GFR calc non Af Amer 49 (*)    GFR calc Af Amer 56 (*)    All other components within normal limits  CBC - Abnormal; Notable for the following components:   RBC 3.65 (*)    Hemoglobin 11.1 (*)    HCT 34.6 (*)    All other components within normal limits  SEDIMENTATION RATE - Abnormal; Notable for the following components:   Sed Rate 115 (*)      All other components within normal limits  C-REACTIVE PROTEIN - Abnormal; Notable for the following components:   CRP 20.7 (*)    All other components within normal limits  CULTURE, BLOOD (ROUTINE X 2)  CULTURE, BLOOD (ROUTINE X 2)  SARS CORONAVIRUS 2 (TAT 6-24 HRS)  LACTIC ACID, PLASMA  URINALYSIS, ROUTINE W REFLEX MICROSCOPIC    EKG EKG Interpretation  Date/Time:  Tuesday June 16 2019  13:22:44 EST Ventricular Rate:  81 PR Interval:    QRS Duration: 83 QT Interval:  351 QTC Calculation: 408 R Axis:   64 Text Interpretation: Sinus rhythm Confirmed by Dykstra, Richard ([id number]) on 06/16/2019 1:25:32 PM   Radiology Dg Sacrum/coccyx  Result Date: 06/16/2019 CLINICAL DATA:  Sacral ulcer EXAM: SACRUM AND COCCYX - 2+ VIEW COMPARISON:  CT 11/06/2016 FINDINGS: Sacrum is partially obscured by overlying bowel gas on frontal view. Sacrum is well demonstrated on lateral view with preservation of the overlying posterior cortex. No cortical destruction or periostitis to suggest acute osteomyelitis. No contour deformity to suggest fracture. There is posterior and interbody fusion at L4-5. SI joints appear intact and unremarkable. IMPRESSION: No acute osseous abnormality. No radiographic evidence of acute osteomyelitis of the sacrum. Electronically Signed   By: Nicholas  Plundo M.D.   On: 06/16/2019 13:27   Dg Chest Portable 1 View  Result Date: 06/16/2019 CLINICAL DATA:  Hypotension EXAM: PORTABLE CHEST 1 VIEW COMPARISON:  06/02/2018 FINDINGS: The heart size and mediastinal contours are within normal limits. Calcific aortic knob. Both lungs are clear. The visualized skeletal structures are unremarkable. IMPRESSION: No active disease. Electronically Signed   By: Nicholas  Plundo M.D.   On: 06/16/2019 13:23    Procedures Procedures (including critical care time)  Medications Ordered in ED Medications  sodium chloride 0.9 % bolus 500 mL (has no administration in time range)     Initial Impression / Assessment and Plan / ED Course  I have reviewed the triage vital signs and the nursing notes.  Pertinent labs & imaging results that were available during my care of the patient were reviewed by me and considered in my medical decision making (see chart for details).        Patient is a 68-year-old female presenting today with cellulitis of the sacrum and possible osteomyelitis.   Patient symptoms started Friday.  She lives with her husband who saw the ulcer on Saturday and states that it is gotten progressively worse and opened up with minimal drainage.   On physical exam patient has a stage III decubitus ulcer.   ESR and CRP are elevated consistent with osteomyelitis.  Sacral x-ray nondiagnostic.  MRI ordered.  Blood cultures ordered.   Chest x-ray without abnormality.  Lactate within normal limits no elevated WBC.  No other acute abnormalities other than elevated BUN with baseline creatinine normal consistent with prerenal AKI.  Will give 500 ml of fluid.  Consulted Dr. Sheikh who will admit patient. Requested general surgery be consulted. Discussed case with general surgery who states they are willing to assess patient in the ED and will to deep culture as requested by Dr. Sheikh.  3:47 PM Discussed admission with patient and husband. Questions answered to the best of my ability.   Final Clinical Impressions(s) / ED Diagnoses   Final diagnoses:  Pressure injury of sacral region, stage 3 (HCC)  Cellulitis of buttock  ESR raised  CRP elevated    ED Discharge Orders    None       ,  S,   PA 06/16/19 1547    Dykstra, Richard S, MD 06/17/19 0847  

## 2019-06-17 DIAGNOSIS — D649 Anemia, unspecified: Secondary | ICD-10-CM

## 2019-06-17 DIAGNOSIS — R7401 Elevation of levels of liver transaminase levels: Secondary | ICD-10-CM

## 2019-06-17 DIAGNOSIS — E440 Moderate protein-calorie malnutrition: Secondary | ICD-10-CM | POA: Insufficient documentation

## 2019-06-17 DIAGNOSIS — R627 Adult failure to thrive: Secondary | ICD-10-CM

## 2019-06-17 DIAGNOSIS — F0150 Vascular dementia without behavioral disturbance: Secondary | ICD-10-CM

## 2019-06-17 DIAGNOSIS — N179 Acute kidney failure, unspecified: Secondary | ICD-10-CM

## 2019-06-17 LAB — COMPREHENSIVE METABOLIC PANEL
ALT: 95 U/L — ABNORMAL HIGH (ref 0–44)
AST: 66 U/L — ABNORMAL HIGH (ref 15–41)
Albumin: 2.7 g/dL — ABNORMAL LOW (ref 3.5–5.0)
Alkaline Phosphatase: 81 U/L (ref 38–126)
Anion gap: 6 (ref 5–15)
BUN: 26 mg/dL — ABNORMAL HIGH (ref 8–23)
CO2: 24 mmol/L (ref 22–32)
Calcium: 9 mg/dL (ref 8.9–10.3)
Chloride: 115 mmol/L — ABNORMAL HIGH (ref 98–111)
Creatinine, Ser: 0.92 mg/dL (ref 0.44–1.00)
GFR calc Af Amer: >60 mL/min (ref >60)
GFR calc non Af Amer: >60 mL/min (ref >60)
Glucose, Bld: 150 mg/dL — ABNORMAL HIGH (ref 70–99)
Potassium: 3.6 mmol/L (ref 3.5–5.1)
Sodium: 145 mmol/L (ref 135–145)
Total Bilirubin: 0.7 mg/dL (ref 0.3–1.2)
Total Protein: 5.7 g/dL — ABNORMAL LOW (ref 6.5–8.1)

## 2019-06-17 LAB — CBC
HCT: 27.5 % — ABNORMAL LOW (ref 36.0–46.0)
Hemoglobin: 9 g/dL — ABNORMAL LOW (ref 12.0–15.0)
MCH: 31 pg (ref 26.0–34.0)
MCHC: 32.7 g/dL (ref 30.0–36.0)
MCV: 94.8 fL (ref 80.0–100.0)
Platelets: 213 10*3/uL (ref 150–400)
RBC: 2.9 MIL/uL — ABNORMAL LOW (ref 3.87–5.11)
RDW: 12 % (ref 11.5–15.5)
WBC: 6.5 10*3/uL (ref 4.0–10.5)
nRBC: 0 % (ref 0.0–0.2)

## 2019-06-17 LAB — HEMOGLOBIN A1C
Hgb A1c MFr Bld: 5.9 % — ABNORMAL HIGH (ref 4.8–5.6)
Mean Plasma Glucose: 122.63 mg/dL

## 2019-06-17 LAB — HIV ANTIBODY (ROUTINE TESTING W REFLEX): HIV Screen 4th Generation wRfx: NONREACTIVE

## 2019-06-17 LAB — GLUCOSE, CAPILLARY
Glucose-Capillary: 133 mg/dL — ABNORMAL HIGH (ref 70–99)
Glucose-Capillary: 122 mg/dL — ABNORMAL HIGH (ref 70–99)

## 2019-06-17 LAB — PREALBUMIN: Prealbumin: 7.7 mg/dL — ABNORMAL LOW (ref 18–38)

## 2019-06-17 MED ORDER — JUVEN PO PACK
1.0000 | PACK | Freq: Two times a day (BID) | ORAL | Status: DC
  Administered 2019-06-18 – 2019-06-20 (×4): 1 via ORAL
  Filled 2019-06-17 (×7): qty 1

## 2019-06-17 MED ORDER — ENSURE ENLIVE PO LIQD
237.0000 mL | Freq: Four times a day (QID) | ORAL | Status: DC
  Administered 2019-06-17 – 2019-06-20 (×9): 237 mL via ORAL

## 2019-06-17 MED ORDER — SODIUM CHLORIDE 0.9 % IV SOLN
INTRAVENOUS | Status: DC | PRN
  Administered 2019-06-17: 03:00:00 250 mL via INTRAVENOUS

## 2019-06-17 MED ORDER — SODIUM CHLORIDE 0.9 % IV SOLN
INTRAVENOUS | Status: AC
  Administered 2019-06-17 – 2019-06-18 (×2): via INTRAVENOUS

## 2019-06-17 MED ORDER — LIDOCAINE 5 % EX PTCH
1.0000 | MEDICATED_PATCH | CUTANEOUS | Status: DC
  Administered 2019-06-17 – 2019-06-19 (×3): 1 via TRANSDERMAL
  Filled 2019-06-17 (×4): qty 1

## 2019-06-17 NOTE — Progress Notes (Signed)
PHARMACY - PHYSICIAN COMMUNICATION CRITICAL VALUE ALERT - BLOOD CULTURE IDENTIFICATION (BCID)  Casey Jennings is an 68 y.o. female who presented to [facility] on 06/16/2019 with a chief complaint of sacral wound, hypotension  Assessment:  1 of 4 culture bottles from blood growing GPC - presumed contaminant  Name of physician (or Provider) Contacted: n/a  Current antibiotics: Zosyn for wound  Changes to prescribed antibiotics recommended:  No changes  No results found for this or any previous visit.  [name] 06/17/2019  6:23 PM

## 2019-06-17 NOTE — Consult Note (Signed)
WOC Nurse Consult Note: Reason for Consult: Cellulitis and full thickness wound to right buttock near gluteal cleft.  See photograph in EMR taken yesterday. Patient seen by CCS PA Will Creig Hines yesterday. His note is appreciated.  Wound type: Infectious vs pressure. If pressure, wound is Stage 3 with proximal 1/3 not completely unroofed/revealed.  Small area of tissue infarct vs DTPI located at 2 o'clock.  Pressure Injury POA: Yes Measurement: Area of involvement measures 10cm x 4cm x 0.6cm Wound bed: 20% red, 30% yellow, 40% darker maroon tissue. Sloughing epidermis is rolled and pushed aside. Drainage (amount, consistency, odor) serous, small Periwound:3cm to periwound erythema, induration and warmth. As noted above, DTPI located at 2 o'clock measuring 2cm x 1cm (Dark purple) Dressing procedure/placement/frequency: Appreciate consultation by CCS PA and agree with mattress replacement. I have amended his topical care orders to include a saline moistened wet-to-dry dressing beneath the silicone foam-this will be changed twice daily due to wound depth.  The foam can be reused for up to three days and changed PRN for soiling or rolling of dressing edges. The wound has not completely declared at the proximal third and may ultimately require some debridement.  Turning and repositioning off of affected area while in bed and the floating of the patient's heels has been provided as guidance for Nursing.  I have provided a pressure reduction chair pad for her use while OOB to chair both in house and post discharge.   [facility] [place] nursing team will not follow, but will remain available to this patient, the nursing and medical teams.  Please re-consult as needed.  Thanks, Maudie Flakes, MSN, RN, [place], Arther Abbott  Pager# [phone]

## 2019-06-17 NOTE — Progress Notes (Addendum)
CC: Sacral wound.  Subjective: Very sleepy this AM.  Hard to wake up.  Her husband is in the room with her, he says this is very early for her.  She is usually withdrawn and slow to wake up on normal days.  Today she was extremely hard to wake up.  Objective: Vital signs in last 24 hours: Temp:  [97.7 F (36.5 C)-99.2 F (37.3 C)] 98.1 F (36.7 C) (11/25 0529) Pulse Rate:  [70-100] 76 (11/25 0529) Resp:  [15-20] 18 (11/25 0529) BP: (104-136)/(56-101) 129/62 (11/25 0529) SpO2:  [98 %-100 %] 99 % (11/25 0529) Weight:  [49.4 kg] 49.4 kg (11/25 0529) Last BM Date: 06/15/19 Nothing p.o. recorded 1223 IV No output recorded T-max 99.2, vital signs are stable. A.m. labs shows mildly elevated LFTs, Preop BUN is 7.7, WBC 6.5, H/H 9/27.5 Platelets 213,000. Intake/Output from previous day: 11/24 0701 - 11/25 0700 In: 1223.4 [I.V.:688.9; IV Piggyback:534.5] Out: -  Intake/Output this shift: No intake/output data recorded.  General appearance: Sleepy, withdrawn, never opened her eyes.  Did not speak the entire time we were examining her. Skin: Picture below.  Skin breakdown in the midportion of the wound.  With surrounding cellulitis.  I think there is probably a little bit more cellulitis than there was yesterday.  No palpable fluid collection.    Lab Results:  Recent Labs    06/16/19 2024 06/17/19 0614  WBC 7.5 6.5  HGB 9.6* 9.0*  HCT 31.3* 27.5*  PLT 216 213    BMET Recent Labs    06/16/19 2024 06/17/19 0614  NA 143 145  K 3.5 3.6  CL 112* 115*  CO2 23 24  GLUCOSE 161* 150*  BUN 32* 26*  CREATININE 0.95 0.92  CALCIUM 9.0 9.0   PT/INR No results for input(s): LABPROT, INR in the last 72 hours.  Recent Labs  Lab 06/16/19 2024 06/17/19 0614  AST 70* 66*  ALT 103* 95*  ALKPHOS 83 81  BILITOT 0.7 0.7  PROT 6.2* 5.7*  ALBUMIN 3.0* 2.7*     Lipase     Component Value Date/Time   LIPASE 39 04/10/2014 1853     Medications: . aspirin  81 mg Oral  Daily  . atorvastatin  80 mg Oral Daily  . donepezil  10 mg Oral QHS  . folic acid  1 mg Oral Daily  . heparin  5,000 Units Subcutaneous Q8H  . memantine  10 mg Oral BID  . metoCLOPramide  10 mg Oral TID AC  . mirtazapine  7.5 mg Oral Daily  . multivitamin with minerals  1 tablet Oral Daily  . risperiDONE  0.5 mg Oral Daily  . risperiDONE  1 mg Oral QHS  . thiamine  100 mg Oral Daily   Or  . thiamine  100 mg Intravenous Daily    Assessment/Plan Type II diabetes Dementia/memory loss Hx of hypertension Hyperlipidemia Hypothyroid/hx of parathyroidectomy CKD Hx GERD Severe Malnutrition -prealbumin 7.7 Hx cholecystectomy Covid negative   Sacral ulcer  -MRI: Mild superficial soft tissue swelling and enhancement over the sacrum and right buttock, consistent with cellulitis. No abscess. 2. No osteomyelitis.  FEN: IV fluids/currently NPO. ID: none currently DVT:  Pending Follow up:  TBD   Plan: MRI shows there is no abscess and no fluid collection.  This is a cellulitis with a level 2 or 3 sacral decubitus.  I would recommend ongoing IV antibiotics, air mattress and keep her off this site is much as possible.  Local  wound care including wet-to-dry over the area where there is skin breakdown.  Then dry foam dressing over the site.  Recommend nutrition consult to help with her severe malnutrition.  I do not see any need for surgical intervention at this point.       LOS: 1 day    Yemariam Magar 06/17/2019 Please see Amion

## 2019-06-17 NOTE — Evaluation (Signed)
Clinical/Bedside Swallow Evaluation Patient Details  Name: Casey Jennings MRN: [id number] Date of Birth: Jun 03, 1951  Today's Date: 06/17/2019 Time: SLP Start Time (ACUTE ONLY): 4098 SLP Stop Time (ACUTE ONLY): 1440 SLP Time Calculation (min) (ACUTE ONLY): 20 min  Past Medical History:  Past Medical History:  Diagnosis Date  . Anemia   . Arthritis   . Chronic kidney disease    hx of uti   . Depression   . Diabetes mellitus without complication ([place])   . GERD (gastroesophageal reflux disease)   . Heart murmur   . Hypercalcemia   . Hyperlipidemia   . Hypertension   . Memory loss   . Osteopenia   . Osteoporosis   . UTI (urinary tract infection) 05/14/2018  . Yeast infection    questinable per pt on 05/23/12- to let MD be aware of    Past Surgical History:  Past Surgical History:  Procedure Laterality Date  . BACK SURGERY    . BLADDER SUSPENSION    . CARPAL TUNNEL RELEASE    . CESAREAN SECTION    . CHOLECYSTECTOMY N/A 04/11/2014   Procedure: LAPAROSCOPIC CHOLECYSTECTOMY WITH INTRAOPERATIVE CHOLANGIOGRAM;  Surgeon: [name], MD;  Location: [facility];  Service: General;  Laterality: N/A;  . PARATHYROIDECTOMY  05/27/2012   Procedure: PARATHYROIDECTOMY;  Surgeon: Earnstine Regal, MD;  Location: [facility];  Service: General;  Laterality: N/A;  . right ankle surgery      due to fracture   . SPINE SURGERY    . TONSILLECTOMY     HPI:  68yo female admitted 06/16/2019 with sacral sound, hypotension, poor PO intake. PMH: CKD3, depression, anxiety, DM with gastroparesis, GERD, HLD, hypercalemia, Alzheimer's dementia with behavioral disturbances, UTIs, yeast infection, anemia. CXR = no active disease   Assessment / Plan / Recommendation Clinical Impression  Pt seen at bedside for assessment of swallow function and safety, and identification of least restrictive diet. Pt's husband was present, and reports pt has had minimal intake recently, taking very few bites of food/ sips of liquid.  Husband reports no difficulty swallowing. Pt was reluctant to take po trials, but exhibited no difficulty with sips of water and bites of puree and graham cracker. Husband reports pt tolerates meds whole with water. Recommend beginning regular texture solids to allow full range of po choices to maximize intake. Thin liquids, and meds whole with liquid also recommended. SLP will follow briefly for diet tolerance and education. Husband verbalizes awareness of the possibility of deteriorating swallow function with advancing dementia.    SLP Visit Diagnosis: Dysphagia, unspecified (R13.10)    Aspiration Risk  Moderate aspiration risk;Risk for inadequate nutrition/hydration    Diet Recommendation Regular;Thin liquid   Liquid Administration via: Cup;Straw Medication Administration: Whole meds with liquid Supervision: Staff to assist with self feeding;Patient able to self feed;Full supervision/cueing for compensatory strategies Compensations: Slow rate;Small sips/bites Postural Changes: Seated upright at 90 degrees    Other  Recommendations Oral Care Recommendations: Oral care QID   Follow up Recommendations 24 hour supervision/assistance      Frequency and Duration min 1 x/week  1 week;2 weeks       Prognosis Prognosis for Safe Diet Advancement: Good Barriers to Reach Goals: Cognitive deficits      Swallow Study   General Date of Onset: 06/16/19 HPI: 68yo female admitted 06/16/2019 with sacral sound, hypotension, poor PO intake. PMH: CKD3, depression, anxiety, DM with gastroparesis, GERD, HLD, hypercalemia, Alzheimer's dementia with behavioral disturbances, UTIs, yeast infection, anemia. CXR = no  active disease Type of Study: Bedside Swallow Evaluation Previous Swallow Assessment: none Diet Prior to this Study: NPO Temperature Spikes Noted: No Respiratory Status: Room air History of Recent Intubation: No Behavior/Cognition: Alert;Cooperative;Pleasant mood Oral Cavity Assessment:  Within Functional Limits Oral Care Completed by SLP: No Oral Cavity - Dentition: Adequate natural dentition Vision: Functional for self-feeding Self-Feeding Abilities: Needs assist;Needs set up;Able to feed self Patient Positioning: Upright in bed Baseline Vocal Quality: Normal Volitional Swallow: Able to elicit    Oral/Motor/Sensory Function Overall Oral Motor/Sensory Function: Within functional limits   Ice Chips Ice chips: Not tested   Thin Liquid Thin Liquid: Within functional limits Presentation: Straw    Nectar Thick Nectar Thick Liquid: Not tested   Honey Thick Honey Thick Liquid: Not tested   Puree Puree: Within functional limits   Solid     Solid: Within functional limits Presentation: [place], [place], [place] Pathologist Office: [phone] Pager: [phone]  Shonna Chock 06/17/2019,2:47 PM

## 2019-06-17 NOTE — Progress Notes (Signed)
PROGRESS NOTE  Casey Jennings AJO:[id number] DOB: [birth year] DOA: 06/16/2019 PCP: Reynold Bowen, MD  Brief history:  H/o dementia with progressive decline/FTT from, weight loss of 70lbs over the last year, h/o diabetes, has been off diabetes meds with ongoing weight loss and poor appetite, she is brought to the hospital due to sacral wound, hypotension and poor oral intake   HPI/Recap of past 24 hours:  Patient has baseline dementia, appears only oriented to person at baseline She does not talk much, does report neck pain Husband at bedside  Assessment/Plan: Active Problems:   Diabetes mellitus type 2 with complications (HCC)   Gastroparesis due to DM ([place])   Memory loss   Dementia with behavioral disturbance ([place])   Cellulitis of sacral region  Sacral /right buttock cellulitis, likely with underline stage III pressure injury -per husband, patient has been mostly in bed during the day except occasionally getting up to the kitchen -she favors laying on her right side -MRI pelvic showed cellulitis, no abscess, no osteomyelitis -blood culture in process -general surgery and wound care input appreciated, recommend clear wound with soap and water, a saline moistened wet-to-dry dressing beneath the silicone foam-this will be changed twice daily due to wound depth.  The foam can be reused for up to three days and changed PRN for soiling or rolling of dressing edges. -mattress replacement, pressure off loading measures, nutrition supplement -she is started on zosyn since admission  Transaminitis: H/o cholecytstectomy, prior ct ab reviewed, no liver and biliary tress abnormalitis -patient does not have ab pain, no n/v -Suspect transminitis is  from infection and dehydration, improving Husband does reports patient drink 2 beers a day  AKI on CKDII/azotemia -bun/cr on presentation is 37/1.15 -she appear dehydrated, continue hydration, bun/cr is improving  Normocytic anemia  Appear chronic at baseline, hgb around 9 No acute blood loss  H/o insulin dependent dm2 with gastroparesis -has been off insulin due to significant weight loss and poor oral intake -am blood glucose range from 76 to 150, a1c 5.9 -home meds reglan continued  Moderate malnutrition in the contact of chronic illness, underweight Patient has poor appetite, has been most drinking boost at home, reports weight loss of 70lbs Body mass index is 18.71 kg/m.   FTT with dementia -she appear very weak, will get Pt/OT eval, husband does not want to snf placement, will need to maximize home health  I am concerned that patient Overall  prognosis is poor due to continued declined, poor oral intake, weight loss. Mostly in bed. I have expressed my concern to patient's husband at bedside, husband want to take patient's home, he is not interested in SNF, will need to maximize home health.  DVT Prophylaxis: heparin subQ  Code Status: full  Family Communication: patient and husband at bedside  Disposition Plan: not ready to discharge   Consultants:  General surgery  Procedures:  none  Antibiotics:  zosyn   Objective: BP (!) 145/70 (BP Location: Right Arm)   Pulse 80   Temp 99.3 F (37.4 C) (Oral)   Resp 18   Ht 5\' 4"  (1.626 m)   Wt 49.4 kg   SpO2 98%   BMI 18.71 kg/m   Intake/Output Summary (Last 24 hours) at 06/17/2019 1911 Last data filed at 06/17/2019 1833 Gross per 24 hour  Intake 1725.48 ml  Output 550 ml  Net 1175.48 ml   Filed Weights   06/16/19 1131 06/17/19 0529  Weight: 49.4 kg 49.4 kg    Exam:  Patient is examined daily including today on 06/17/2019, exams remain the same as of yesterday except that has changed    General:  Chronically ill appearing, very weak, flat affect, does not talk much, but does not appear in acute distress  Cardiovascular: RRR  Respiratory: CTABL  Abdomen: Soft/ND/NT, positive BS  Musculoskeletal: No Edema  Neuro: alert, not  oriented , appear calm and cooperate   Data Reviewed: Basic Metabolic Panel: Recent Labs  Lab 06/16/19 1149 06/16/19 2024 06/17/19 0614  NA 142 143 145  K 3.5 3.5 3.6  CL 108 112* 115*  CO2 25 23 24   GLUCOSE 232* 161* 150*  BUN 37* 32* 26*  CREATININE 1.15* 0.95 0.92  CALCIUM 9.4 9.0 9.0   Liver Function Tests: Recent Labs  Lab 06/16/19 2024 06/17/19 0614  AST 70* 66*  ALT 103* 95*  ALKPHOS 83 81  BILITOT 0.7 0.7  PROT 6.2* 5.7*  ALBUMIN 3.0* 2.7*   No results for input(s): LIPASE, AMYLASE in the last 168 hours. No results for input(s): AMMONIA in the last 168 hours. CBC: Recent Labs  Lab 06/16/19 1149 06/16/19 2024 06/17/19 0614  WBC 9.2 7.5 6.5  HGB 11.1* 9.6* 9.0*  HCT 34.6* 31.3* 27.5*  MCV 94.8 99.1 94.8  PLT 241 216 213   Cardiac Enzymes:   No results for input(s): CKTOTAL, CKMB, CKMBINDEX, TROPONINI in the last 168 hours. BNP (last 3 results) No results for input(s): BNP in the last 8760 hours.  ProBNP (last 3 results) No results for input(s): PROBNP in the last 8760 hours.  CBG: Recent Labs  Lab 06/17/19 0801 06/17/19 1156  GLUCAP 133* 122*    Recent Results (from the past 240 hour(s))  Blood culture (routine x 2)     Status: None (Preliminary result)   Collection Time: 06/16/19  3:04 PM   Specimen: Right Antecubital; Blood  Result Value Ref Range Status   Specimen Description   Final    RIGHT ANTECUBITAL Performed at [facility] [address], [place], [place] [address]    Special Requests   Final    BOTTLES DRAWN AEROBIC AND ANAEROBIC Blood Culture adequate volume Performed at [facility] [address], [place], [place] [address]    Culture  Setup Time   Final    GRAM POSITIVE COCCI AEROBIC BOTTLE ONLY CRITICAL RESULT CALLED TO, READ BACK BY AND VERIFIED WITHChristean Grief PHARMD [id number] 06/17/19 A BROWNING Performed at [facility], [place] [address] [place],  [address]    Culture  PENDING  Incomplete   Report Status PENDING  Incomplete  SARS CORONAVIRUS 2 (TAT 6-24 HRS) Nasopharyngeal Nasopharyngeal Swab     Status: None   Collection Time: 06/16/19  3:04 PM   Specimen: Nasopharyngeal Swab  Result Value Ref Range Status   SARS Coronavirus 2 NEGATIVE NEGATIVE Final    Comment: (NOTE) SARS-CoV-2 target nucleic acids are NOT DETECTED. The SARS-CoV-2 RNA is generally detectable in upper and lower respiratory specimens during the acute phase of infection. Negative results do not preclude SARS-CoV-2 infection, do not rule out co-infections with other pathogens, and should not be used as the sole basis for treatment or other patient management decisions. Negative results must be combined with clinical observations, patient history, and epidemiological information. The expected result is Negative. Fact Sheet for Patients: SugarRoll.be Fact Sheet for Healthcare Providers: https://www.woods-mathews.com/ This test is not yet approved or cleared by the [place] and  has been authorized  for detection and/or diagnosis of SARS-CoV-2 by FDA under an Emergency Use Authorization (EUA). This EUA will remain  in effect (meaning this test can be used) for the duration of the COVID-19 declaration under Section 56 4(b)(1) of the Act, 21 U.S.C. section 360bbb-3(b)(1), unless the authorization is terminated or revoked sooner. Performed at [facility], [place] [address], [place], [place] [address]   Blood culture (routine x 2)     Status: None (Preliminary result)   Collection Time: 06/16/19  3:53 PM   Specimen: BLOOD  Result Value Ref Range Status   Specimen Description   Final    BLOOD LEFT ANTECUBITAL Performed at [facility] [address], [place], [place] [address]    Special Requests   Final    BOTTLES DRAWN AEROBIC AND ANAEROBIC Blood Culture adequate volume Performed at [facility] [address], [place], [place] [address]    Culture   Final    NO GROWTH < 24 HOURS Performed at [facility] [address] [place], [place] [address]    Report Status PENDING  Incomplete     Studies: Mr Pelvis W Wo Contrast  Result Date: 06/16/2019 CLINICAL DATA:  Sacral decubitus ulcer. EXAM: MRI PELVIS WITHOUT AND WITH CONTRAST TECHNIQUE: Multiplanar multisequence MR imaging of the pelvis was performed both before and after administration of intravenous contrast. CONTRAST:  59mL GADAVIST GADOBUTROL 1 MMOL/ML IV SOLN COMPARISON:  Sacrum x-rays from same day. CT abdomen pelvis dated November 06, 2016. FINDINGS: Bones/Joint/Cartilage No marrow signal abnormality. No fracture or dislocation. Prior L4-L5 fusion. No joint effusion. Muscles and Tendons The bilateral gluteal, hamstring, and iliopsoas tendons are intact. No muscle edema. Lower paraspinous muscle atrophy. Soft tissue Mild superficial soft tissue swelling and enhancement over the sacrum and right buttock. No fluid collection or hematoma. No soft tissue mass. IMPRESSION: 1. Mild superficial soft tissue swelling and enhancement over the sacrum and right buttock, consistent with cellulitis. No abscess. 2. No osteomyelitis. Electronically Signed   By: Titus Dubin M.D.   On: 06/16/2019 20:44    Scheduled Meds: . aspirin  81 mg Oral Daily  . atorvastatin  80 mg Oral Daily  . donepezil  10 mg Oral QHS  . feeding supplement (ENSURE ENLIVE)  237 mL Oral QID  . folic acid  1 mg Oral Daily  . heparin  5,000 Units Subcutaneous Q8H  . memantine  10 mg Oral BID  . metoCLOPramide  10 mg Oral TID AC  . mirtazapine  7.5 mg Oral Daily  . multivitamin with minerals  1 tablet Oral Daily  . nutrition supplement (JUVEN)  1 packet Oral BID WC  . risperiDONE  0.5 mg Oral Daily  . risperiDONE  1 mg Oral QHS  . thiamine  100 mg Oral Daily   Or  . thiamine  100 mg Intravenous Daily    Continuous Infusions: . sodium chloride 75  mL/hr at 06/17/19 1800  . sodium chloride Stopped (06/17/19 0240)  . piperacillin-tazobactam (ZOSYN)  IV 3.375 g (06/17/19 1833)  . piperacillin-tazobactam       Time spent: 18mins I have personally reviewed and interpreted on  06/17/2019 daily labs, tele strips, imagings as discussed above under date review session and assessment and plans.  I reviewed all nursing notes, pharmacy notes, consultant notes,  vitals, pertinent old records  I have discussed plan of care as described above with RN , patient and family on 06/17/2019   Annamaria Boots  Erlinda Hong MD, PhD, FACP  [facility] If 7PM-7AM, please contact night-coverage at [email or web address], password [facility] 06/17/2019, 7:11 PM  LOS: 1 day

## 2019-06-17 NOTE — Progress Notes (Signed)
Initial Nutrition Assessment  DOCUMENTATION CODES:   Non-severe (moderate) malnutrition in context of chronic illness, Underweight  INTERVENTION:   -Ensure Enlive po QID, each supplement provides 350 kcal and 20 grams of protein -Juven Fruit Punch BID, each serving provides 95kcal and 2.5g of protein (amino acids glutamine and arginine) -Recommend Regular diet  -If within [place], recommend consideration of nutrition support given prolonged malnutrition related to dementia.  NUTRITION DIAGNOSIS:   Moderate Malnutrition related to chronic illness(dementia) as evidenced by energy intake < or equal to 75% for > or equal to 1 month, percent weight loss.  GOAL:   Patient will meet greater than or equal to 90% of their needs  MONITOR:   PO intake, Supplement acceptance, Labs, Weight trends, I & O's, Skin  REASON FOR ASSESSMENT:   Consult Assessment of nutrition requirement/status  ASSESSMENT:   68 y.o. female with medical history significant for marijuana due to chronic kidney disease stage III, depression and anxiety, diabetes mellitus with gastroparesis, GERD, hyperlipidemia, hypercalcemia, history of memory loss and Alzheimer's dementia with behavioral disturbances, history of UTIs and yeast infections, history of anemia, as well as other comorbidities who presents with chief complaint of sacral wound that developed since last week. Admitted for cellultis of sacral wound.  **RD working remotely**  No answer when attempted to call patient's room.  Per family report, pt has been struggling with appetite and weight loss for ~1-2 years now given dementia. Pt has been mainly subsisting on 4 Boosts a day and water. Estimate this is a daily intake of ~960 kcals and 40g protein. Pt has not been meeting estimated needs for some time.   RD will order Ensure Enlive supplements QID, along with Juven supplements to aid in wound healing of sacral pressure injury. Four Ensures will provide ~1400  kcals and 80g protein. Will monitor PO intakes.  Per weight records, pt has lost 51 lbs since 06/02/18 (31% wt loss x 1 year, significant for time frame). Suspect pt has at least moderate malnutrition but unable to diagnose severe without exam.  Medications: Folic acid tablet, Reglan tablet, Remeron tablet, Multivitamin with minerals daily, Thiamine tablet Labs reviewed: CBGs: 122-133  NUTRITION - FOCUSED PHYSICAL EXAM:  Working remotely.  Diet Order:   Diet Order    None      EDUCATION NEEDS:   Not appropriate for education at this time  Skin:  Skin Assessment: Skin Integrity Issues: Skin Integrity Issues:: Stage III Stage III: Per WOC note, sacral pressure injury  Last BM:  11/23  Height:   Ht Readings from Last 1 Encounters:  06/16/19 5\' 4"  (1.626 m)    Weight:   Wt Readings from Last 1 Encounters:  06/17/19 49.4 kg    Ideal Body Weight:  54.5 kg  BMI:  Body mass index is 18.71 kg/m.  Estimated Nutritional Needs:   Kcal:  1500-1700  Protein:  80-90g  Fluid:  1.7L/day  Clayton Bibles, MS, RD, LDN Inpatient Clinical Dietitian Pager: [phone] After Hours Pager: [phone]

## 2019-06-18 DIAGNOSIS — E440 Moderate protein-calorie malnutrition: Secondary | ICD-10-CM

## 2019-06-18 DIAGNOSIS — Z7189 Other specified counseling: Secondary | ICD-10-CM

## 2019-06-18 DIAGNOSIS — Z515 Encounter for palliative care: Secondary | ICD-10-CM

## 2019-06-18 LAB — COMPREHENSIVE METABOLIC PANEL
ALT: 77 U/L — ABNORMAL HIGH (ref 0–44)
AST: 49 U/L — ABNORMAL HIGH (ref 15–41)
Albumin: 2.5 g/dL — ABNORMAL LOW (ref 3.5–5.0)
Alkaline Phosphatase: 86 U/L (ref 38–126)
Anion gap: 9 (ref 5–15)
BUN: 16 mg/dL (ref 8–23)
CO2: 22 mmol/L (ref 22–32)
Calcium: 8.3 mg/dL — ABNORMAL LOW (ref 8.9–10.3)
Chloride: 113 mmol/L — ABNORMAL HIGH (ref 98–111)
Creatinine, Ser: 0.87 mg/dL (ref 0.44–1.00)
GFR calc Af Amer: >60 mL/min (ref >60)
GFR calc non Af Amer: >60 mL/min (ref >60)
Glucose, Bld: 125 mg/dL — ABNORMAL HIGH (ref 70–99)
Potassium: 3.2 mmol/L — ABNORMAL LOW (ref 3.5–5.1)
Sodium: 144 mmol/L (ref 135–145)
Total Bilirubin: 0.7 mg/dL (ref 0.3–1.2)
Total Protein: 5.6 g/dL — ABNORMAL LOW (ref 6.5–8.1)

## 2019-06-18 LAB — CBC WITH DIFFERENTIAL/PLATELET
Abs Immature Granulocytes: 0.03 10*3/uL (ref 0.00–0.07)
Basophils Absolute: 0 10*3/uL (ref 0.0–0.1)
Basophils Relative: 0 %
Eosinophils Absolute: 0 10*3/uL (ref 0.0–0.5)
Eosinophils Relative: 1 %
HCT: 27.8 % — ABNORMAL LOW (ref 36.0–46.0)
Hemoglobin: 9.3 g/dL — ABNORMAL LOW (ref 12.0–15.0)
Immature Granulocytes: 0 %
Lymphocytes Relative: 19 %
Lymphs Abs: 1.4 10*3/uL (ref 0.7–4.0)
MCH: 30.4 pg (ref 26.0–34.0)
MCHC: 33.5 g/dL (ref 30.0–36.0)
MCV: 90.8 fL (ref 80.0–100.0)
Monocytes Absolute: 0.4 10*3/uL (ref 0.1–1.0)
Monocytes Relative: 6 %
Neutro Abs: 5.4 10*3/uL (ref 1.7–7.7)
Neutrophils Relative %: 74 %
Platelets: 263 10*3/uL (ref 150–400)
RBC: 3.06 MIL/uL — ABNORMAL LOW (ref 3.87–5.11)
RDW: 11.8 % (ref 11.5–15.5)
WBC: 7.3 10*3/uL (ref 4.0–10.5)
nRBC: 0 % (ref 0.0–0.2)

## 2019-06-18 LAB — GLUCOSE, CAPILLARY: Glucose-Capillary: 118 mg/dL — ABNORMAL HIGH (ref 70–99)

## 2019-06-18 LAB — SEDIMENTATION RATE: Sed Rate: 96 mm/hr — ABNORMAL HIGH (ref 0–22)

## 2019-06-18 LAB — LACTIC ACID, PLASMA: Lactic Acid, Venous: 0.5 mmol/L (ref 0.5–1.9)

## 2019-06-18 LAB — CULTURE, BLOOD (ROUTINE X 2): Special Requests: ADEQUATE

## 2019-06-18 LAB — MRSA PCR SCREENING: MRSA by PCR: NEGATIVE

## 2019-06-18 LAB — C-REACTIVE PROTEIN: CRP: 12.6 mg/dL — ABNORMAL HIGH (ref <1.0)

## 2019-06-18 MED ORDER — POTASSIUM CHLORIDE 10 MEQ/100ML IV SOLN
10.0000 meq | INTRAVENOUS | Status: AC
  Administered 2019-06-18 (×4): 10 meq via INTRAVENOUS
  Filled 2019-06-18 (×4): qty 100

## 2019-06-18 MED ORDER — LACTATED RINGERS IV SOLN
INTRAVENOUS | Status: AC
  Administered 2019-06-18 – 2019-06-19 (×2): via INTRAVENOUS

## 2019-06-18 MED ORDER — MUSCLE RUB 10-15 % EX CREA
TOPICAL_CREAM | CUTANEOUS | Status: DC | PRN
  Administered 2019-06-20: 04:00:00 via TOPICAL
  Filled 2019-06-18: qty 85

## 2019-06-18 NOTE — Evaluation (Signed)
Occupational Therapy Evaluation Patient Details Name: Casey Jennings MRN: [id number] DOB: [birth year] Today's Date: 06/18/2019    History of Present Illness 68 yo female admitted 06/16/2019 with sacral wound, HTN, and poor PO intake. PMH including CKD3, depression, anxiety, DM with gastroparesis, GERD, HLD, hypercalemia, Alzheimer's dementia with behavioral disturbances, UTIs, yeast infection, and anemia.   Clinical Impression   PTA, pt was living with her husband and required assistance for ADLs and supervision for mobility; husband present and providing home set up and PLOF. Pt currently requiring Total A for ADLs and bed mobility. Pt presenting with decreased arousal and very drowsy; husband reporting she is usually more awake after 11 am. Pt would benefit from further acute OT to facilitate safe dc. Recommend dc to home once medically stable per physician. Feel pt would [facility] from [facility], however, husband wants to dc home without and see if behavior changes in home environment.      Follow Up Recommendations  Supervision/Assistance - 24 hour(Discussed HH services with husband, wants to dc home without HHtherapies at this time and will contact [facility] if this changes)    Equipment Recommendations  3 in 1 bedside commode    Recommendations for Other Services PT consult     Precautions / Restrictions Precautions Precautions: Fall      Mobility Bed Mobility Overal bed mobility: Needs Assistance Bed Mobility: Supine to Sit;Sit to Supine;Rolling Rolling: Total assist   Supine to sit: Total assist;+2 for physical assistance Sit to supine: Total assist;+2 for physical assistance   General bed mobility comments: Total A for bringing BLEs towards EOB and then elevating trunk  Transfers Overall transfer level: Needs assistance Equipment used: 2 person hand held assist Transfers: Sit to/from Stand Sit to Stand: Max assist;+2 physical assistance         General transfer  comment: Max A +2 for power up and then to maintain standing balance    Balance Overall balance assessment: Needs assistance Sitting-balance support: No upper extremity supported;Feet supported Sitting balance-Leahy Scale: Poor Sitting balance - Comments: Requiring Min-Max A for sitting balance at SOB. Postural control: Posterior lean Standing balance support: No upper extremity supported;During functional activity Standing balance-Leahy Scale: Zero                             ADL either performed or assessed with clinical judgement   ADL Overall ADL's : Needs assistance/impaired Eating/Feeding: Maximal assistance;Total assistance;Sitting;Bed level Eating/Feeding Details (indicate cue type and reason): Max hand over hand to maintain grasp on cup or spoon and bring to mouth                                   General ADL Comments: Total A for ADLs and bed mobility     Vision         Perception     Praxis      Pertinent Vitals/Pain Pain Assessment: Faces Faces Pain Scale: Hurts little more Pain Location: Generalized Pain Descriptors / Indicators: Grimacing Pain Intervention(s): Monitored during session;Limited activity within patient's tolerance;Repositioned     Hand Dominance     Extremity/Trunk Assessment Upper Extremity Assessment Upper Extremity Assessment: Generalized weakness   Lower Extremity Assessment Lower Extremity Assessment: Defer to PT evaluation   Cervical / Trunk Assessment Cervical / Trunk Assessment: Kyphotic;Other exceptions Cervical / Trunk Exceptions: Forward flexion of neck   Communication Communication  Communication: No difficulties   Cognition Arousal/Alertness: Lethargic Behavior During Therapy: Flat affect(Keeping her eye closed) Overall Cognitive Status: History of cognitive impairments - at baseline                                 General Comments: Pt keeping her eye closed for majority of  session. opening her mouth with cues and when cup/spoon place at lips. Not following majority of cues   General Comments  Husband present throughout    Exercises     Shoulder Instructions      Home Living Family/patient expects to be discharged to:: Private residence Living Arrangements: Spouse/significant other Available Help at Discharge: Family;Available 24 hours/day Type of Home: House       Home Layout: One level     Bathroom Shower/Tub: Teacher, early years/pre: Standard     Home Equipment: None          Prior Functioning/Environment Level of Independence: Needs assistance  Gait / Transfers Assistance Needed: Husband supervise mobility in home ADL's / Homemaking Assistance Needed: Husband assist with BADLs; pt able to participate   Comments: Aides three hours three days a week through [employer]         OT Problem List: Decreased strength;Decreased range of motion;Decreased activity tolerance;Impaired balance (sitting and/or standing);Decreased knowledge of use of DME or AE;Decreased knowledge of precautions;Decreased cognition;Pain      OT Treatment/Interventions: Self-care/ADL training;Therapeutic exercise;Energy conservation;DME and/or AE instruction;Therapeutic activities;Patient/family education    OT Goals(Current goals can be found in the care plan section) Acute Rehab OT Goals Patient Stated Goal: Return to home OT Goal Formulation: With family Time For Goal Achievement: 07/02/19 Potential to Achieve Goals: Good  OT Frequency: Min 2X/week   Barriers to D/C:            Co-evaluation PT/OT/SLP Co-Evaluation/Treatment: Yes Reason for Co-Treatment: For patient/therapist safety;To address functional/ADL transfers   OT goals addressed during session: ADL's and self-care      AM-PAC OT "6 Clicks" Daily Activity     Outcome Measure Help from another person eating meals?: Total Help from another person taking care of personal  grooming?: Total Help from another person toileting, which includes using toliet, bedpan, or urinal?: Total Help from another person bathing (including washing, rinsing, drying)?: Total Help from another person to put on and taking off regular upper body clothing?: Total Help from another person to put on and taking off regular lower body clothing?: Total 6 Click Score: 6   End of Session Nurse Communication: Mobility status  Activity Tolerance: Patient limited by fatigue;Patient limited by lethargy Patient left: in bed;with call bell/phone within reach;with nursing/sitter in room;with family/visitor present  OT Visit Diagnosis: Unsteadiness on feet (R26.81);Other abnormalities of gait and mobility (R26.89);Muscle weakness (generalized) (M62.81)                Time: 4008-6761 OT Time Calculation (min): 32 min Charges:  OT General Charges $OT Visit: 1 Visit OT Evaluation $OT Eval Moderate Complexity: [place], OTR/L Acute Rehab Pager: [phone] Office: [place] 06/18/2019, 10:50 AM

## 2019-06-18 NOTE — Progress Notes (Signed)
Pt noted to be lethargic and drowsy during shift. Pt not easily aroused or oriented enough to swallow medications. Tried crushing meds and placing in applesauce and patient would not swallow. MD was notified.

## 2019-06-18 NOTE — Progress Notes (Signed)
PROGRESS NOTE  Casey Jennings WIO:[id number] DOB: April 18, 1951 DOA: 06/16/2019 PCP: Reynold Bowen, MD  Brief history:  H/o dementia with progressive decline/FTT from, weight loss of 70lbs over the last year, h/o diabetes, has been off diabetes meds with ongoing weight loss and poor appetite, she is brought to the hospital due to sacral wound, hypotension and poor oral intake   HPI/Recap of past 24 hours:  No significant interval improvement Very weak, very drowsy in am  (husband reports she is not a morning person) Very poor appetite (husband reports patient drink boost mainly at home)  Patient is having a hard to time get up from laying to sitting position with two person assists   This morning she refused to take her morning meds  She has not fever, vital signs are stable Husband at bedside, he reports lidocaine path help her neck pain, he wants to hold off mri neck for now   Assessment/Plan: Active Problems:   Diabetes mellitus type 2 with complications (HCC)   Gastroparesis due to DM ([place])   Memory loss   Dementia with behavioral disturbance ([place])   Cellulitis of sacral region   Malnutrition of moderate degree  Sacral /right buttock cellulitis, likely with underline stage III pressure injury -per husband, patient has been mostly in bed during the day except occasionally getting up to the kitchen, right sided buttock appear red 5days ago prior to coming to the hospital then there is skin breakdown 4days ago prior to coming to the hospital -she favors laying on her right side -MRI pelvic showed cellulitis, no abscess, no osteomyelitis -blood culture in process -general surgery and wound care input appreciated, recommend clear wound with soap and water, a saline moistened wet-to-dry dressing beneath the silicone foam-this will be changed twice daily due to wound depth.  The foam can be reused for up to three days and changed PRN for soiling or rolling of dressing edges.  -mattress replacement, pressure off loading measures, nutrition supplement -she is started on zosyn since admission, today on exam, the erythema associated with the open wound appears to be smaller and less intense, no odor, no significant drainage  Neck pain,  Does appear to have muscle spasm on left side of the neck, nontender on cervical spine, husband does not want to proceed with mri spine, he thinks patient's neck pain is related to patient's position laying in bed Will hold off mri spine for now, topical lidoderm and heat pack. 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   FTT with dementia -she appear very weak, will get Pt/OT eval, husband does not want to snf placement, will need to maximize home health  I am concerned that patient Overall  prognosis is poor due  to continued declined, poor oral intake, weight loss. Mostly in bed. I have expressed my concern to patient's husband at bedside, husband wants to take patient's home, he is not interested in SNF, husband does not think patient can tolerate PT/OT, he currently declined home health Pt/OT. He confirmed patient has an  established DNR and patient does not want feeding  Tube, husband is interested in palliative care services, will consult.  DVT Prophylaxis: heparin subQ  Code Status: full  Family Communication: patient and husband at bedside  Disposition Plan: not ready to discharge   Consultants:  General surgery  Palliative care  Procedures:  none  Antibiotics:  zosyn   Objective: BP (!) 152/78 (BP Location: Right Arm)   Pulse 88   Temp 98.8 F (37.1 C) (Oral)   Resp 17   Ht 5\' 4"  (1.626 m)   Wt 49.4 kg   SpO2 96%   BMI 18.71 kg/m   Intake/Output Summary (Last 24 hours) at 06/18/2019 0751 Last data filed at 06/18/2019 0600 Gross per 24 hour  Intake 1628.39 ml  Output 950 ml  Net 678.39 ml   Filed Weights   06/16/19 1131 06/17/19 0529  Weight: 49.4 kg 49.4 kg    Exam: Patient is examined daily including today on 06/18/2019, exams remain the same as of yesterday except that has changed    General:  Chronically ill appearing, very weak, flat affect, does not talk much, but does not appear in acute distress  Cardiovascular: RRR  Respiratory: CTABL  Abdomen: Soft/ND/NT, positive BS  Musculoskeletal: No Edema  Neuro: alert, not oriented , appear calm and cooperate   Data Reviewed: Basic Metabolic Panel: Recent Labs  Lab 06/16/19 1149 06/16/19 2024 06/17/19 0614 06/18/19 0618  NA 142 143 145 144  K 3.5 3.5 3.6 3.2*  CL 108 112* 115* 113*  CO2 25 23 24 22   GLUCOSE 232* 161* 150* 125*  BUN 37* 32* 26* 16  CREATININE 1.15* 0.95 0.92 0.87  CALCIUM 9.4 9.0 9.0 8.3*   Liver Function Tests: Recent Labs  Lab 06/16/19 2024 06/17/19 0614 06/18/19 0618  AST 70* 66* 49*  ALT 103* 95* 77*  ALKPHOS 83 81 86  BILITOT 0.7 0.7 0.7  PROT 6.2* 5.7* 5.6*  ALBUMIN 3.0* 2.7* 2.5*   No results for input(s): LIPASE, AMYLASE in the last 168 hours. No results for input(s): AMMONIA in the last 168 hours. CBC: Recent Labs  Lab 06/16/19 1149 06/16/19 2024 06/17/19  0614  WBC 9.2 7.5 6.5  HGB 11.1* 9.6* 9.0*  HCT 34.6* 31.3* 27.5*  MCV 94.8 99.1 94.8  PLT 241 216 213   Cardiac Enzymes:   No results for input(s): CKTOTAL, CKMB, CKMBINDEX, TROPONINI in the last 168 hours. BNP (last 3 results) No results for input(s): BNP in the last 8760 hours.  ProBNP (last 3 results) No results for input(s): PROBNP in the last 8760 hours.  CBG: Recent Labs  Lab 06/17/19 0801 06/17/19 1156  GLUCAP 133* 122*    Recent Results (from the past 240 hour(s))  Blood culture (routine x 2)     Status: None (Preliminary result)   Collection Time: 06/16/19  3:04 PM   Specimen: Right Antecubital; Blood  Result Value Ref Range Status   Specimen Description   Final    RIGHT ANTECUBITAL Performed at [facility] [address], [place], [place] [address]    Special Requests   Final    BOTTLES DRAWN AEROBIC AND ANAEROBIC Blood Culture  adequate volume Performed at [facility] [address], [place], [place] [address]    Culture  Setup Time   Final    GRAM POSITIVE COCCI AEROBIC BOTTLE ONLY CRITICAL RESULT CALLED TO, READ BACK BY AND VERIFIED WITHChristean Grief PHARMD 7353 06/17/19 A BROWNING Performed at [facility], [place] [address], [place], [place] [address]    Culture GRAM POSITIVE COCCI  Final   Report Status PENDING  Incomplete  SARS CORONAVIRUS 2 (TAT 6-24 HRS) Nasopharyngeal Nasopharyngeal Swab     Status: None   Collection Time: 06/16/19  3:04 PM   Specimen: Nasopharyngeal Swab  Result Value Ref Range Status   SARS Coronavirus 2 NEGATIVE NEGATIVE Final    Comment: (NOTE) SARS-CoV-2 target nucleic acids are NOT DETECTED. The SARS-CoV-2 RNA is generally detectable in upper and lower respiratory specimens during the acute phase of infection. Negative results do not preclude SARS-CoV-2 infection, do not rule out co-infections with other pathogens, and should not be used as the sole basis for treatment or other  patient management decisions. Negative results must be combined with clinical observations, patient history, and epidemiological information. The expected result is Negative. Fact Sheet for Patients: SugarRoll.be Fact Sheet for Healthcare Providers: https://www.woods-mathews.com/ This test is not yet approved or cleared by the [place] FDA and  has been authorized for detection and/or diagnosis of SARS-CoV-2 by FDA under an Emergency Use Authorization (EUA). This EUA will remain  in effect (meaning this test can be used) for the duration of the COVID-19 declaration under Section 56 4(b)(1) of the Act, 21 U.S.C. section 360bbb-3(b)(1), unless the authorization is terminated or revoked sooner. Performed at [facility], [place] [address], [place], [name] [address]   Blood culture (routine x 2)     Status: None (Preliminary result)   Collection Time: 06/16/19  3:53 PM   Specimen: BLOOD  Result Value Ref Range Status   Specimen Description   Final    BLOOD LEFT ANTECUBITAL Performed at [facility] [address] [place], [place] [address]    Special Requests   Final    BOTTLES DRAWN AEROBIC AND ANAEROBIC Blood Culture adequate volume Performed at [facility] [address] [place], [place] [address]    Culture   Final    NO GROWTH < 24 HOURS Performed at [facility] [address] [place],  [address]    Report Status PENDING  Incomplete     Studies: No results found.  Scheduled Meds: . aspirin  81 mg Oral Daily  . atorvastatin  80 mg Oral Daily  . donepezil  10 mg Oral QHS  . feeding supplement (ENSURE ENLIVE)  237 mL Oral QID  . folic acid  1 mg Oral Daily  . heparin  5,000 Units Subcutaneous Q8H  . lidocaine  1 patch Transdermal Q24H  . memantine  10 mg Oral BID  . metoCLOPramide  10 mg Oral TID AC  . mirtazapine  7.5 mg Oral Daily  . multivitamin with  minerals  1 tablet Oral Daily  . nutrition supplement (JUVEN)  1 packet Oral BID WC  . risperiDONE  0.5 mg Oral Daily  . risperiDONE  1 mg Oral QHS  . thiamine  100 mg Oral Daily   Or  . thiamine  100 mg Intravenous Daily    Continuous Infusions: . sodium chloride 10 mL/hr at 06/18/19 0600  . sodium chloride 75 mL/hr at 06/18/19 0600  .  piperacillin-tazobactam (ZOSYN)  IV Stopped (06/18/19 0332)  . piperacillin-tazobactam       Time spent: 52mins I have personally reviewed and interpreted on  06/18/2019 daily labs, tele strips, imagings as discussed above under date review session and assessment and plans.  I reviewed all nursing notes, pharmacy notes, consultant notes,  vitals, pertinent old records  I have discussed plan of care as described above with RN , patient and family on 06/18/2019   Florencia Reasons MD, PhD, FACP  [facility] If 7PM-7AM, please contact night-coverage at [email or web address], password [facility] 06/18/2019, 7:51 AM  LOS: 2 days

## 2019-06-18 NOTE — Consult Note (Signed)
Consultation Note Date: 06/18/2019   Patient Name: Casey Jennings  DOB: 09/07/1950  MRN: [id number]  Age / Sex: 68 y.o., female  PCP: Reynold Bowen, MD Referring Physician: Florencia Reasons, MD  Reason for Consultation: Establishing goals of care  HPI/Patient Profile: 68 y.o. female  admitted on 06/16/2019    Clinical Assessment and Goals of Care: Patient is a 68 year old lady who lives at home with her husband.  She has had a diagnosis of dementia for the last 2-2-1/2 years.  Her husband is her primary caregiver at home.  The patient has home-based health care that visits with her 3 times a week.  She participates in some physical therapy with them.  The patient was getting weaker however was still able to ambulate up until a week ago prior to this current hospitalization.  Past medical history also significant for the fact that the patient has a had unintentional 70 pounds weight loss over the course of the past year.  Her oral intake consists of boost shakes, diet soda and water.  Patient eats very little solid food.  Patient does not have any coughing/choking spells at home according to her husband.  Patient was admitted to the hospital with failure to thrive, ongoing decline, sacral/right buttock cellulitis, stage III pressure injury present on admission.  Hospital course also complicated by neck pain, transaminitis, acute kidney injury with underlying at least a stage II chronic kidney disease.  Patient remains admitted to hospital medicine service.  She was given hydration, wound care consultation and antibiotics.  A palliative medicine consultation has been requested for ongoing goals of care discussions.  Patient is resting in bed.  She does not verbalize at all.  Patient does not have any nonverbal gestures of distress or discomfort evident.  Husband Audry Pili is present at the bedside.  I introduced myself and  palliative care as follows, talked about goals of care as follows:  Palliative medicine is specialized medical care for people living with serious illness. It focuses on providing relief from the symptoms and stress of a serious illness. The goal is to improve quality of life for both the patient and the family.  Goals of care: Broad aims of medical therapy in relation to the patient's values and preferences. Our aim is to provide medical care aimed at enabling patients to achieve the goals that matter most to them, given the circumstances of their particular medical situation and their constraints.   Goals wishes and values important to the patient and the husband attempted to be explored.  Patient's baseline reviewed.  Patient's home-based situation discussed.  Discussed about natural decline trajectory of dementia.  Expressed concerns about the patient's overall poor prognosis due to ongoing decline, poor oral intake, weight loss in addition to current sacral wound.  Husband is aware.  He becomes tearful at times.  We discussed about having the right kind of supportive services at home.  See additional recommendations as listed below.  Thank you for the consult.  The patient's husband's biggest goal  is for the patient to be as well as she can for as long as she can.  He believes that once the patient is back in her familiar surroundings, she may rally to some kind of a baseline.  He is certainly hopeful that this will happen.  Offered hope, active listening and compassionate presence.  Also gently discussed about how hospice can help in the future.  See below.  NEXT OF KIN  husband [employer].  Patient lives at home with husband, they have a son who lives in [place], [place].   SUMMARY OF RECOMMENDATIONS    DNR DNI Home with continuation of current home health care, patient's husband states patient is very familiar with the current home care company staff. He wishes that the patient will soon get  back to some baseline. Gently discussed about palliative care, difference between hospice versus palliative care. At this time, he is willing to consider home based palliative care along with home health care on discharge.  We reviewed about dementia trajectory of decline, including functional and cognitive decline. We also discussed about patient's current PO intake. All of his questions addressed to the best of my ability.  Thank you for the consult.   Code Status/Advance Care Planning:  DNR    Symptom Management:    as above.   Palliative Prophylaxis:   Delirium Protocol   Psycho-social/Spiritual:   Desire for further Chaplaincy support:yes  Additional Recommendations: Caregiving  Support/Resources  Prognosis:   Guarded, could be next few months.   Discharge Planning: Home with Home Health and home based palliative care.      Primary Diagnoses: Present on Admission: . Cellulitis of sacral region . Memory loss . Gastroparesis due to DM ([place]) . Diabetes mellitus type 2 with complications ([place]) . Dementia with behavioral disturbance ([place])   I have reviewed the medical record, interviewed the patient and family, and examined the patient. The following aspects are pertinent.  Past Medical History:  Diagnosis Date  . Anemia   . Arthritis   . Chronic kidney disease    hx of uti   . Depression   . Diabetes mellitus without complication ([place])   . GERD (gastroesophageal reflux disease)   . Heart murmur   . Hypercalcemia   . Hyperlipidemia   . Hypertension   . Memory loss   . Osteopenia   . Osteoporosis   . UTI (urinary tract infection) 05/14/2018  . Yeast infection    questinable per pt on 05/23/12- to let MD be aware of    Social History   Socioeconomic History  . Marital status: Married    Spouse name: Not on file  . Number of children: 2  . Years of education: some college  . Highest education level: Not on file  Occupational History  . Occupation:  Retired  Scientific laboratory technician  . Financial resource strain: Not on file  . Food insecurity    Worry: Not on file    Inability: Not on file  . Transportation needs    Medical: Not on file    Non-medical: Not on file  Tobacco Use  . Smoking status: Former Smoker    Quit date: 05/07/1978    Years since quitting: 41.1  . Smokeless tobacco: Never Used  Substance and Sexual Activity  . Alcohol use: Yes    Comment:  1- 2 times per year  . Drug use: No  . Sexual activity: Not on file  Lifestyle  . Physical activity  Days per week: Not on file    Minutes per session: Not on file  . Stress: Not on file  Relationships  . Social Herbalist on phone: Not on file    Gets together: Not on file    Attends religious service: Not on file    Active member of club or organization: Not on file    Attends meetings of clubs or organizations: Not on file    Relationship status: Not on file  Other Topics Concern  . Not on file  Social History Narrative   Lives at home with her husband.   Right-handed.   2-3 glasses of diet Coke daily.   Family History  Problem Relation Age of Onset  . Diabetes Mother   . Heart attack Father    Scheduled Meds: . aspirin  81 mg Oral Daily  . atorvastatin  80 mg Oral Daily  . donepezil  10 mg Oral QHS  . feeding supplement (ENSURE ENLIVE)  237 mL Oral QID  . folic acid  1 mg Oral Daily  . heparin  5,000 Units Subcutaneous Q8H  . lidocaine  1 patch Transdermal Q24H  . memantine  10 mg Oral BID  . metoCLOPramide  10 mg Oral TID AC  . mirtazapine  7.5 mg Oral Daily  . multivitamin with minerals  1 tablet Oral Daily  . nutrition supplement (JUVEN)  1 packet Oral BID WC  . risperiDONE  0.5 mg Oral Daily  . risperiDONE  1 mg Oral QHS  . thiamine  100 mg Oral Daily   Or  . thiamine  100 mg Intravenous Daily   Continuous Infusions: . sodium chloride 10 mL/hr at 06/18/19 0600  . sodium chloride Stopped (06/18/19 1144)  . lactated ringers 75 mL/hr at  06/18/19 1145  . piperacillin-tazobactam (ZOSYN)  IV 3.375 g (06/18/19 0921)  . piperacillin-tazobactam    . potassium chloride 10 mEq (06/18/19 1259)   PRN Meds:.sodium chloride, acetaminophen **OR** acetaminophen, bisacodyl, fluticasone, LORazepam **OR** LORazepam, Muscle Rub, nitroGLYCERIN, ondansetron **OR** ondansetron (ZOFRAN) IV, polyethylene glycol, traMADol Medications Prior to Admission:  Prior to Admission medications   Medication Sig Start Date End Date Taking? Authorizing Provider  aspirin 81 MG tablet Take 81 mg by mouth daily.   Yes [provider]  atorvastatin (LIPITOR) 80 MG tablet Take 80 mg by mouth daily.    Yes [provider]  donepezil (ARICEPT) 10 MG tablet TAKE 1 TABLET BY MOUTH EVERYDAY AT BEDTIME Patient taking differently: Take 10 mg by mouth at bedtime.  07/14/18  Yes Marcial Pacas, MD  fluticasone (FLONASE) 50 MCG/ACT nasal spray Place 2 sprays into both nostrils daily as needed for allergies. 05/24/18  Yes [provider]  memantine (NAMENDA) 10 MG tablet TAKE 1 TABLET BY MOUTH TWICE A DAY Patient taking differently: Take 10 mg by mouth 2 (two) times daily.  07/03/18  Yes Marcial Pacas, MD  metoCLOPramide (REGLAN) 10 MG tablet Take 10 mg by mouth 3 (three) times daily before meals.    Yes [provider]  mirtazapine (REMERON) 7.5 MG tablet Take 7.5 mg by mouth daily. 05/07/19  Yes [provider]  Multiple Vitamin (MULTIVITAMIN WITH MINERALS) TABS Take 1 tablet by mouth daily.   Yes [provider]  nitroGLYCERIN (NITROSTAT) 0.4 MG SL tablet Place 0.4 mg under the tongue every 5 (five) minutes as needed for chest pain.   Yes [provider]  risperiDONE (RISPERDAL) 0.5 MG tablet ONE IN THE  MORNING, 2 TABLETS AT NIGHT Patient taking differently: Take 0.5-1 mg by mouth See admin instructions. 0.5mg  in AM and 1mg  HS 08/06/18  Yes Marcial Pacas, MD   Allergies  Allergen Reactions  . Codeine     hallucinations    Review of Systems Non verbal.   Physical Exam Weak chronically ill appearing lady Regular work of breathing Abdomen soft,  Regular S 1 S 2 Opens eyes, tracks, in no distress.   Vital Signs: BP (!) 147/76 (BP Location: Right Arm)   Pulse 91   Temp 99.7 F (37.6 C) (Oral)   Resp 17   Ht 5\' 4"  (1.626 m)   Wt 49.4 kg   SpO2 98%   BMI 18.71 kg/m  Pain Scale: Faces   Pain Score: 0-No pain   SpO2: SpO2: 98 % O2 Device:SpO2: 98 % O2 Flow Rate: .   IO: Intake/output summary:   Intake/Output Summary (Last 24 hours) at 06/18/2019 1342 Last data filed at 06/18/2019 0600 Gross per 24 hour  Intake 1628.39 ml  Output 950 ml  Net 678.39 ml    LBM: Last BM Date: 06/15/19 Baseline Weight: Weight: 49.4 kg Most recent weight: Weight: 49.4 kg     Palliative Assessment/Data:   PPS 30%  Time In:  1200 Time Out:  1300 Time Total:  60 min.  Greater than 50%  of this time was spent counseling and coordinating care related to the above assessment and plan.  Signed by: Loistine Chance, MD   Please contact Palliative Medicine Team phone at [phone] for questions and concerns.  For individual provider: See Shea Evans

## 2019-06-18 NOTE — Evaluation (Signed)
Physical Therapy Evaluation Patient Details Name: Casey Jennings MRN: [id number] DOB: 1951-04-24 Today's Date: 06/18/2019   History of Present Illness  68 yo female admitted 06/16/2019 with sacral wound, HTN, and poor PO intake. PMH including CKD3, depression, anxiety, DM with gastroparesis, GERD, HLD, hypercalemia, Alzheimer's dementia with behavioral disturbances, UTIs, yeast infection, and anemia.  Clinical Impression  PTA, pt was living with her husband and required assistance for ADLs and supervision for mobility; husband present and providing home set up and PLOF. Pt currently requiring Total A for ADLs and mobility. Pt presenting with decreased arousal and very drowsy; husband reporting she is usually more awake after 11 am. Pt would benefit from further acute PT to facilitate safe dc. Recommend dc to home once medically stable per physician. Feel pt would benefit from [place], however, husband wants to dc home without and see if behavior changes in home environment.    Follow Up Recommendations No PT follow up(No HHPT at this time at request of spouse)    Equipment Recommendations       Recommendations for Other Services       Precautions / Restrictions Precautions Precautions: Fall Restrictions Weight Bearing Restrictions: No      Mobility  Bed Mobility Overal bed mobility: Needs Assistance Bed Mobility: Supine to Sit;Sit to Supine;Rolling Rolling: Total assist   Supine to sit: Total assist;+2 for physical assistance Sit to supine: Total assist;+2 for physical assistance   General bed mobility comments: Total A for bringing BLEs towards EOB and then elevating trunk  Transfers Overall transfer level: Needs assistance Equipment used: 2 person hand held assist Transfers: Sit to/from Stand Sit to Stand: Max assist;+2 physical assistance         General transfer comment: Max A +2 for power up and then to maintain standing balance  Ambulation/Gait              General Gait Details: NT 2nd level of assist to stand and pt difficulty following cues  Stairs            Wheelchair Mobility    Modified Rankin (Stroke Patients Only)       Balance Overall balance assessment: Needs assistance Sitting-balance support: No upper extremity supported;Feet supported Sitting balance-Leahy Scale: Poor Sitting balance - Comments: Requiring Min-Max A for sitting balance at SOB. Postural control: Posterior lean Standing balance support: No upper extremity supported;During functional activity Standing balance-Leahy Scale: Zero                               Pertinent Vitals/Pain Pain Assessment: Faces Faces Pain Scale: Hurts little more Pain Location: Generalized Pain Descriptors / Indicators: Grimacing Pain Intervention(s): Limited activity within patient's tolerance;Monitored during session;Repositioned    Home Living Family/patient expects to be discharged to:: Private residence Living Arrangements: Spouse/significant other Available Help at Discharge: Family;Available 24 hours/day Type of Home: House       Home Layout: One level Home Equipment: None      Prior Function Level of Independence: Needs assistance   Gait / Transfers Assistance Needed: Husband supervise mobility in home, pt ambulating limited distance sans AD  ADL's / Homemaking Assistance Needed: Husband assist with BADLs; pt able to participate  Comments: Aides three hours three days a week through [place]        Extremity/Trunk Assessment   Upper Extremity Assessment Upper Extremity Assessment: Generalized weakness  Lower Extremity Assessment Lower Extremity Assessment: Generalized weakness    Cervical / Trunk Assessment Cervical / Trunk Assessment: Kyphotic;Other exceptions Cervical / Trunk Exceptions: Forward flexion of neck  Communication   Communication: No difficulties  Cognition Arousal/Alertness:  Lethargic Behavior During Therapy: Flat affect Overall Cognitive Status: History of cognitive impairments - at baseline                                 General Comments: Pt keeping her eye closed for majority of session. opening her mouth with cues and when cup/spoon place at lips. Not following majority of cues      General Comments General comments (skin integrity, edema, etc.): Husband present throughout    Exercises     Assessment/Plan    PT Assessment Patient needs continued PT services  PT Problem List Decreased strength;Decreased activity tolerance;Decreased balance;Decreased mobility;Decreased cognition;Decreased knowledge of use of DME       PT Treatment Interventions DME instruction;Gait training;Stair training;Therapeutic activities;Therapeutic exercise;Balance training;Cognitive remediation;Patient/family education    PT Goals (Current goals can be found in the Care Plan section)  Acute Rehab PT Goals Patient Stated Goal: Return to home PT Goal Formulation: With family Time For Goal Achievement: 07/02/19 Potential to Achieve Goals: Fair    Frequency Min 3X/week   Barriers to discharge        Co-evaluation PT/OT/SLP Co-Evaluation/Treatment: Yes Reason for Co-Treatment: For patient/therapist safety PT goals addressed during session: Mobility/safety with mobility OT goals addressed during session: ADL's and self-care       AM-PAC PT "6 Clicks" Mobility  Outcome Measure Help needed turning from your back to your side while in a flat bed without using bedrails?: A Lot Help needed moving from lying on your back to sitting on the side of a flat bed without using bedrails?: A Lot Help needed moving to and from a bed to a chair (including a wheelchair)?: A Lot Help needed standing up from a chair using your arms (e.g., wheelchair or bedside chair)?: A Lot Help needed to walk in hospital room?: Total Help needed climbing 3-5 steps with a railing? :  Total 6 Click Score: 10    End of Session Equipment Utilized During Treatment: Gait belt Activity Tolerance: Patient limited by fatigue;Other (comment)(cognition) Patient left: in bed;with call bell/phone within reach;with nursing/sitter in room;with family/visitor present Nurse Communication: Mobility status PT Visit Diagnosis: Muscle weakness (generalized) (M62.81);Difficulty in walking, not elsewhere classified (R26.2)    Time: 0623-7628 PT Time Calculation (min) (ACUTE ONLY): 32 min   Charges:   PT Evaluation $PT Eval Moderate Complexity: [place] [facility] Acute Rehabilitation Services Pager [phone] Office [phone]   Jameia Makris 06/18/2019, 4:09 PM

## 2019-06-18 NOTE — Plan of Care (Signed)

## 2019-06-18 NOTE — TOC Initial Note (Signed)
Transition of Care [facility]) - Initial/Assessment Note    Patient Details  Name: Casey Jennings MRN: [id number] Date of Birth: 1950-08-04  Transition of Care [facility] CM/SW Contact:    Trish Mage, LCSW Phone Number: 06/18/2019, 1:08 PM  Clinical Narrative:  Met with husband as patient was sleeping, and, according to notes, has significantly decreased response.  Mr Trudie Reed states his wife was diagnosed 2 years ago, and he has been increasing in care giver responsibilities since then.  Up to a week ago, she was still getting out of bed for several hours, and they would go for a ride in the car.  She was using no DME.  He has employed care givers coming into the home for 3 days a week, 3 hours at a time to give him a break.  Only other support is son, who comes when requested.  Mr Trudie Reed is planning on taking his wife home.  H is open to a referral to [facility] services for RN as patient now has, of recently, Stage III PI that requires care and attention.  I see that the Dr also put in a Palliative consult. TOC will continue to follow during the course of hospitalization.                 Expected Discharge Plan: [place] Barriers to Discharge: Continued Medical Work up   Patient Goals and CMS Choice Patient states their goals for this hospitalization and ongoing recovery are:: "We're going back home." CMS Medicare.gov Compare Post Acute Care list provided to:: Patient Represenative (must comment)(husband) Choice offered to / list presented to : Spouse(see above)  Expected Discharge Plan and Services Expected Discharge Plan: [place]   Discharge Planning Services: CM Consult Post Acute Care Choice: Durable Medical Equipment(3 in 1) Living arrangements for the past 2 months: Single Family Home                                      Prior Living Arrangements/Services Living arrangements for the past 2 months: Single Family Home Lives with::  Spouse Patient language and need for interpreter reviewed:: Yes Do you feel safe going back to the place where you live?: Yes      Need for Family Participation in Patient Care: Yes (Comment) Care giver support system in place?: Yes (comment) Current home services: Homehealth aide Criminal Activity/Legal Involvement Pertinent to Current Situation/Hospitalization: No - Comment as needed  Activities of Daily Living Home Assistive Devices/Equipment: Eyeglasses ADL Screening (condition at time of admission) Patient's cognitive ability adequate to safely complete daily activities?: Yes Is the patient deaf or have difficulty hearing?: No Does the patient have difficulty seeing, even when wearing glasses/contacts?: No Does the patient have difficulty concentrating, remembering, or making decisions?: Yes Patient able to express need for assistance with ADLs?: Yes Does the patient have difficulty dressing or bathing?: Yes Independently performs ADLs?: No Communication: Independent Dressing (OT): Needs assistance Is this a change from baseline?: Pre-admission baseline Grooming: Needs assistance Is this a change from baseline?: Pre-admission baseline Feeding: Independent Bathing: Needs assistance Is this a change from baseline?: Pre-admission baseline Toileting: Needs assistance Is this a change from baseline?: Pre-admission baseline In/Out Bed: Needs assistance Is this a change from baseline?: Pre-admission baseline Walks in Home: Needs assistance Is this a change from baseline?: Pre-admission baseline Does the patient have difficulty walking or climbing  stairs?: Yes(secondary to weakness) Weakness of Legs: Both Weakness of Arms/Hands: None  Permission Sought/Granted Permission sought to share information with : Family Supports    Share Information with NAME: husband           Emotional Assessment Appearance:: Appears stated age Attitude/Demeanor/Rapport: Unable to Assess Affect  (typically observed): Unable to Assess   Alcohol / Substance Use: Not Applicable Psych Involvement: No (comment)  Admission diagnosis:  Cellulitis of buttock [L03.317] ESR raised [R70.0] CRP elevated [R79.82] Pressure injury of sacral region, stage 3 (HCC) [L89.153] Cellulitis of sacral region [L03.319] Patient Active Problem List   Diagnosis Date Noted  . Malnutrition of moderate degree 06/17/2019  . Cellulitis of sacral region 06/16/2019  . Dehydration   . Acute encephalopathy 06/02/2018  . UTI (urinary tract infection) 05/14/2018  . Hypertension 05/13/2018  . CKD (chronic kidney disease), stage IV ([place]) 05/13/2018  . Acute lower UTI 05/13/2018  . Acute UTI 05/13/2018  . History of adenomatous polyp of colon 12/05/2017  . Dementia with behavioral disturbance ([place]) 10/31/2017  . Alzheimer's dementia with behavioral disturbance ([place]) 02/25/2017  . Memory loss 12/24/2016  . Visual hallucination 12/24/2016  . Diabetes mellitus type 2 with complications ([place]) 01/60/1093  . Gastroparesis due to DM ([place]) 04/11/2014  . Rheumatoid arthritis ([place]) 04/11/2014  . Cholecystitis 04/10/2014  . Hyperparathyroidism, primary ([place]) 05/12/2012   PCP:  Reynold Bowen, MD Pharmacy:   [id number] [place] [place]Phone: [phone]: [phone]    Social Determinants of Health (SDOH) Interventions    Readmission Risk Interventions No flowsheet data found.

## 2019-06-18 NOTE — Progress Notes (Signed)
Pt noted to be lethargic,arousal. Pt was able to take her HS medications, poor PO fluid intake. Pt's MEWS noted to be 2- BP-159/77, R-23( monitor). Respirations were recounted by writer-19. Hospitalist was made aware, no new orders. Continue to monitor. Emotional support provided for pt and her Husband.

## 2019-06-19 DIAGNOSIS — L89153 Pressure ulcer of sacral region, stage 3: Secondary | ICD-10-CM

## 2019-06-19 DIAGNOSIS — L03317 Cellulitis of buttock: Secondary | ICD-10-CM

## 2019-06-19 DIAGNOSIS — R700 Elevated erythrocyte sedimentation rate: Secondary | ICD-10-CM

## 2019-06-19 LAB — COMPREHENSIVE METABOLIC PANEL
ALT: 64 U/L — ABNORMAL HIGH (ref 0–44)
AST: 37 U/L (ref 15–41)
Albumin: 2.5 g/dL — ABNORMAL LOW (ref 3.5–5.0)
Alkaline Phosphatase: 100 U/L (ref 38–126)
Anion gap: 10 (ref 5–15)
BUN: 10 mg/dL (ref 8–23)
CO2: 23 mmol/L (ref 22–32)
Calcium: 8.6 mg/dL — ABNORMAL LOW (ref 8.9–10.3)
Chloride: 106 mmol/L (ref 98–111)
Creatinine, Ser: 0.75 mg/dL (ref 0.44–1.00)
GFR calc Af Amer: >60 mL/min (ref >60)
GFR calc non Af Amer: >60 mL/min (ref >60)
Glucose, Bld: 144 mg/dL — ABNORMAL HIGH (ref 70–99)
Potassium: 3.5 mmol/L (ref 3.5–5.1)
Sodium: 139 mmol/L (ref 135–145)
Total Bilirubin: 0.7 mg/dL (ref 0.3–1.2)
Total Protein: 5.9 g/dL — ABNORMAL LOW (ref 6.5–8.1)

## 2019-06-19 LAB — MAGNESIUM: Magnesium: 1.6 mg/dL — ABNORMAL LOW (ref 1.7–2.4)

## 2019-06-19 LAB — GLUCOSE, CAPILLARY: Glucose-Capillary: 136 mg/dL — ABNORMAL HIGH (ref 70–99)

## 2019-06-19 MED ORDER — PROBIOTIC 250 MG PO CAPS: 250.0000 mg | ORAL_CAPSULE | Freq: Two times a day (BID) | ORAL | 0 refills | Status: AC

## 2019-06-19 MED ORDER — AMOXICILLIN-POT CLAVULANATE 875-125 MG PO TABS: 1.0000 | ORAL_TABLET | Freq: Two times a day (BID) | ORAL | 0 refills | Status: AC

## 2019-06-19 MED ORDER — SILVER SULFADIAZINE 1 % EX CREA
TOPICAL_CREAM | Freq: Every day | CUTANEOUS | Status: DC
  Administered 2019-06-19 – 2019-06-20 (×2): via TOPICAL
  Filled 2019-06-19: qty 50

## 2019-06-19 MED ORDER — MAGNESIUM SULFATE 2 GM/50ML IV SOLN
2.0000 g | Freq: Once | INTRAVENOUS | Status: AC
  Administered 2019-06-19: 2 g via INTRAVENOUS
  Filled 2019-06-19: qty 50

## 2019-06-19 MED ORDER — LIP MEDEX EX OINT
TOPICAL_OINTMENT | CUTANEOUS | Status: DC | PRN
  Filled 2019-06-19: qty 7

## 2019-06-19 MED ORDER — SILVER SULFADIAZINE 1 % EX CREA: TOPICAL_CREAM | Freq: Every day | CUTANEOUS | 0 refills | Status: AC

## 2019-06-19 NOTE — TOC Progression Note (Addendum)
Transition of Care [facility]) - Progression Note    Patient Details  Name: Casey Jennings MRN: [id number] Date of Birth: [birth year]  Transition of Care [facility] CM/SW [place], [place] Phone Number: 06/19/2019, 3:15 PM  Clinical Narrative:   Dr requested I make referral to palliative, and to [facility] for RN, aide and SW services.  Husband had no preferences.  Called [facility] for palliative; so far [facility] has declined pt for [facility] services.  [place] with [facility] accepted for [facility] services.  RN will start on Friday in a week.  Consulted with Dr who agreed that would be OK. TOC will continue to follow during the course of hospitalization.     Expected Discharge Plan: [place] Barriers to Discharge: Continued Medical Work up  Expected Discharge Plan and Services Expected Discharge Plan: [place]   Discharge Planning Services: CM Consult Post Acute Care Choice: Durable Medical Equipment(3 in 1) Living arrangements for the past 2 months: Single Family Home                                       Social Determinants of Health (SDOH) Interventions    Readmission Risk Interventions No flowsheet data found.

## 2019-06-19 NOTE — Progress Notes (Addendum)
PROGRESS NOTE  Casey Jennings PFX:[id number] DOB: [birth year] DOA: 06/16/2019 PCP: Reynold Bowen, MD  Brief history:  H/o dementia with progressive decline/FTT from, weight loss of 70lbs over the last year, h/o diabetes, has been off diabetes meds with ongoing weight loss and poor appetite, she is brought to the hospital due to sacral wound, hypotension and poor oral intake   HPI/Recap of past 24 hours:  No significant interval improvement Very weak, very drowsy in am  (husband reports she is not a morning person) Very poor appetite (husband reports patient drink boost mainly at home), intermittently refusing medication, husband stated he can get patient take p.o.'s better       Assessment/Plan: Active Problems:   Diabetes mellitus type 2 with complications (HCC)   Gastroparesis due to DM ([place])   Memory loss   Dementia with behavioral disturbance ([place])   Cellulitis of sacral region   Malnutrition of moderate degree   Palliative care by specialist   Goals of care, counseling/discussion  Sacral /right buttock cellulitis, likely with underline stage III pressure injury -per husband, patient has been mostly in bed during the day except occasionally getting up to the kitchen, right sided buttock appear red 5days ago prior to coming to the hospital then there is skin breakdown 4days ago prior to coming to the hospital -she favors laying on her right side -MRI pelvic showed cellulitis, no abscess, no osteomyelitis -blood culture no growth -general surgery and wound care input appreciated, recommends clear wound with soap and water, a saline moistened wet-to-dry dressing beneath the silicone foam-this will be changed twice daily due to wound depth.  The foam can be reused for up to three days and changed PRN for soiling or rolling of dressing edges. -mattress replacement, pressure off loading measures, nutrition supplement -she is started on zosyn since admission, peri wound  erythema has improved, there is superficial skin breakdown with small area of necrosis, no fluctuant, no significant drainage ,no odor, general surgery Dr. Jorene Minors input appreciated, he recommend treat like a burn placed Silvadene daily, follow-up with wound clinic. -Home health order placed, wound clinic  referral order placed  Neck pain,  Does appear to have muscle spasm on left side of the neck, nontender on cervical spine, husband does not want to proceed with mri spine, he thinks patient's neck pain is related to patient's position laying in bed Improved on topical lidoderm and heat pack. 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   FTT with dementia -she appears very weak,  husband does not want to snf placement, he declined PT service, will need home health RN for wound care  I am concerned that patient Overall  prognosis is  poor due to continued declined, poor oral intake, weight loss. Mostly in bed. I have expressed my concern to patient's husband at bedside, husband wants to take patient's home, he is not interested in SNF, husband  does not think patient can tolerate PT/OT. He confirmed patient has an established DNR and patient does not want feeding  Tube, husband appreciates inpatient palliative care services consult , he agreed to community palliative care service, case manager/social worker informed to arrange community palliative care service.   I discussed the possibility of needing to transition from palliative care to home hospice if patient continue deteriorate. Husband is receptive to the information, he would like to take one step at a time.  DVT Prophylaxis while in the hospital: heparin subQ  Code Status: DNR  Family Communication: patient and husband at bedside  Disposition Plan: home with home health/community palliative care service   Consultants:  General surgery  Palliative care  Procedures:  none  Antibiotics:  zosyn   Objective: BP (!) 153/86 (BP Location: Right Arm)   Pulse 98 Comment: manually  Temp 98 F (36.7 C) (Oral)   Resp 18   Ht 5\' 4"  (1.626 m)   Wt 49.4 kg   SpO2 99%   BMI 18.71 kg/m   Intake/Output Summary (Last 24 hours) at 06/19/2019 1432 Last data filed at 06/19/2019 0600 Gross per 24 hour  Intake 1982.35 ml  Output 1500 ml  Net 482.35 ml   Filed Weights   06/16/19 1131 06/17/19 0529  Weight: 49.4 kg 49.4 kg    Exam: Patient is examined daily including today on 06/19/2019, exams remain the same as of yesterday except that has changed    General:  Chronically ill appearing, very weak, flat affect, does not talk much, but does not appear in acute distress  Cardiovascular: RRR  Respiratory: CTABL  Abdomen: Soft/ND/NT, positive BS  Musculoskeletal: No Edema  Neuro: alert, not oriented , appear calm and cooperate   Sacral wound, erythema has improved, superficial skin breakdown with small area of necrosis, no odor, no active drainage, no pus  Data Reviewed: Basic Metabolic Panel: Recent Labs  Lab 06/16/19 1149 06/16/19 2024 06/17/19 0614  06/18/19 0618 06/19/19 0534  NA 142 143 145 144 139  K 3.5 3.5 3.6 3.2* 3.5  CL 108 112* 115* 113* 106  CO2 25 23 24 22 23   GLUCOSE 232* 161* 150* 125* 144*  BUN 37* 32* 26* 16 10  CREATININE 1.15* 0.95 0.92 0.87 0.75  CALCIUM 9.4 9.0 9.0 8.3* 8.6*  MG  --   --   --   --  1.6*   Liver Function Tests: Recent Labs  Lab 06/16/19 2024 06/17/19 0614 06/18/19 0618 06/19/19 0534  AST 70* 66* 49* 37  ALT 103* 95* 77* 64*  ALKPHOS 83 81 86 100  BILITOT 0.7 0.7 0.7 0.7  PROT 6.2* 5.7* 5.6* 5.9*  ALBUMIN 3.0* 2.7* 2.5* 2.5*   No results for input(s): LIPASE, AMYLASE in the last 168 hours. No results for input(s): AMMONIA in the last 168 hours. CBC: Recent Labs  Lab 06/16/19 1149 06/16/19 2024 06/17/19 0614 06/18/19 0618  WBC 9.2 7.5 6.5 7.3  NEUTROABS  --   --   --  5.4  HGB 11.1* 9.6* 9.0* 9.3*  HCT 34.6* 31.3* 27.5* 27.8*  MCV 94.8 99.1 94.8 90.8  PLT 241 216 213 263   Cardiac Enzymes:   No results for input(s): CKTOTAL, CKMB, CKMBINDEX, TROPONINI in the last 168 hours. BNP (  last 3 results) No results for input(s): BNP in the last 8760 hours.  ProBNP (last 3 results) No results for input(s): PROBNP in the last 8760 hours.  CBG: Recent Labs  Lab 06/17/19 0801 06/17/19 1156 06/18/19 0747 06/19/19 0754  GLUCAP 133* 122* 118* 136*    Recent Results (from the past 240 hour(s))  Blood culture (routine x 2)     Status: Abnormal   Collection Time: 06/16/19  3:04 PM   Specimen: Right Antecubital; Blood  Result Value Ref Range Status   Specimen Description   Final    RIGHT ANTECUBITAL Performed at [facility] [address] [place], [place] [address]    Special Requests   Final    BOTTLES DRAWN AEROBIC AND ANAEROBIC Blood Culture adequate volume Performed at [facility] [address] [place], [place] [address]    Culture  Setup Time   Final    GRAM POSITIVE COCCI AEROBIC BOTTLE ONLY CRITICAL RESULT CALLED TO, READ  BACK BY AND VERIFIED WITH: J LEGGE PHARMD 1821 06/17/19 A BROWNING    Culture (A)  Final    STAPHYLOCOCCUS SPECIES (COAGULASE NEGATIVE) THE SIGNIFICANCE OF ISOLATING THIS ORGANISM FROM A SINGLE SET OF BLOOD CULTURES WHEN MULTIPLE SETS ARE DRAWN IS UNCERTAIN. PLEASE NOTIFY THE MICROBIOLOGY DEPARTMENT WITHIN ONE WEEK IF SPECIATION AND SENSITIVITIES ARE REQUIRED. Performed at [facility], [place] [address], [place], [place] [address]    Report Status 06/18/2019 FINAL  Final  SARS CORONAVIRUS 2 (TAT 6-24 HRS) Nasopharyngeal Nasopharyngeal Swab     Status: None   Collection Time: 06/16/19  3:04 PM   Specimen: Nasopharyngeal Swab  Result Value Ref Range Status   SARS Coronavirus 2 NEGATIVE NEGATIVE Final    Comment: (NOTE) SARS-CoV-2 target nucleic acids are NOT DETECTED. The SARS-CoV-2 RNA is generally detectable in upper and lower respiratory specimens during the acute phase of infection. Negative results do not preclude SARS-CoV-2 infection, do not rule out co-infections with other pathogens, and should not be used as the sole basis for treatment or other patient management decisions. Negative results must be combined with clinical observations, patient history, and epidemiological information. The expected result is Negative. Fact Sheet for Patients: SugarRoll.be Fact Sheet for Healthcare Providers: https://www.woods-mathews.com/ This test is not yet approved or cleared by the [place] FDA and  has been authorized for detection and/or diagnosis of SARS-CoV-2 by FDA under an Emergency Use Authorization (EUA). This EUA will remain  in effect (meaning this test can be used) for the duration of the COVID-19 declaration under Section 56 4(b)(1) of the Act, 21 U.S.C. section 360bbb-3(b)(1), unless the authorization is terminated or revoked sooner. Performed at [facility], [place] [address] [place], [place] [address]   Blood culture  (routine x 2)     Status: None (Preliminary result)   Collection Time: 06/16/19  3:53 PM   Specimen: BLOOD  Result Value Ref Range Status   Specimen Description   Final    BLOOD LEFT ANTECUBITAL Performed at [facility] [address], [place], [place] [address]    Special Requests   Final    BOTTLES DRAWN AEROBIC AND ANAEROBIC Blood Culture adequate volume Performed at [facility] [address] [place], [place] [address]    Culture   Final    NO GROWTH 3 DAYS Performed at [facility], [place] [address] [place],  [address]    Report Status PENDING  Incomplete  MRSA PCR Screening     Status: None   Collection Time: 06/18/19  9:22 AM   Specimen: Nasal Mucosa; Nasopharyngeal  Result Value Ref Range Status   MRSA by PCR NEGATIVE NEGATIVE Final    Comment:        The GeneXpert MRSA Assay (FDA approved for NASAL specimens only), is one component of a comprehensive MRSA colonization surveillance program. It is not intended to diagnose MRSA infection nor to guide or monitor treatment for MRSA infections. Performed at [facility], [place] [address] [place], [place] [address]      Studies: No results found.  Scheduled Meds: . aspirin  81 mg Oral Daily  . atorvastatin  80 mg Oral Daily  . donepezil  10 mg Oral QHS  . feeding supplement (ENSURE ENLIVE)  237 mL Oral QID  . folic acid  1 mg Oral Daily  . heparin  5,000 Units Subcutaneous Q8H  . lidocaine  1 patch Transdermal Q24H  . memantine  10 mg Oral BID  . metoCLOPramide  10 mg Oral TID AC  . mirtazapine  7.5 mg Oral Daily  . multivitamin with minerals  1 tablet Oral Daily  . nutrition supplement (JUVEN)  1 packet Oral BID WC  . risperiDONE  0.5 mg Oral Daily  . risperiDONE  1 mg Oral QHS  . thiamine  100 mg Oral Daily   Or  . thiamine  100 mg Intravenous Daily    Continuous Infusions: . sodium chloride 10 mL/hr at 06/18/19 0600  .  piperacillin-tazobactam (ZOSYN)  IV 3.375 g (06/19/19 1043)     Time spent: 19mins I have personally reviewed and interpreted on  06/19/2019 daily labs, imagings as discussed above under date review session and assessment and plans.  I reviewed all nursing notes, pharmacy notes, consultant notes,  vitals, pertinent old records  I have discussed plan of care as described above with RN , patient and family on 06/19/2019   Florencia Reasons MD, PhD, FACP  [facility] If 7PM-7AM, please contact night-coverage at [email or web address], password [facility] 06/19/2019, 2:32 PM  LOS: 3 days

## 2019-06-19 NOTE — Progress Notes (Signed)
PT Cancellation Note  Patient Details Name: Jonee Lamore MRN: [id number] DOB: 08-04-1950   Cancelled Treatment:     attempted to see twice, each time nursing staff in room various care.  Per chart review, pt plans to D/C to home Saturday.   Rica Koyanagi  PTA Acute  Rehabilitation Services Pager      [phone] Office      [phone]

## 2019-06-19 NOTE — Progress Notes (Signed)
Patient ID: Casey Jennings, female   DOB: [birth year], 68 y.o.   MRN: [id number]     Subjective: Complains of pain at sacrum when moved  Objective: Vital signs in last 24 hours: Temp:  [98 F (36.7 C)-100.3 F (37.9 C)] 98 F (36.7 C) (11/27 0542) Pulse Rate:  [91-108] 98 (11/27 0634) Resp:  [17-23] 18 (11/27 0542) BP: (147-159)/(76-86) 153/86 (11/27 0542) SpO2:  [97 %-99 %] 99 % (11/27 0542) Last BM Date: 06/19/19  Intake/Output from previous day: 11/26 0701 - 11/27 0700 In: 1982.4 [I.V.:1486.7; IV Piggyback:495.7] Out: 2100 [Urine:2100] Intake/Output this shift: No intake/output data recorded.  General appearance: sleeping and comfortable appearing. significant dementia Resp: clear to auscultation bilaterally Cardio: regular rate and rhythm GI: sacral skin intact with a small area of necrosis. no fluctuance  Lab Results:  Recent Labs    06/17/19 0614 06/18/19 0618  WBC 6.5 7.3  HGB 9.0* 9.3*  HCT 27.5* 27.8*  PLT 213 263   BMET Recent Labs    06/18/19 0618 06/19/19 0534  NA 144 139  K 3.2* 3.5  CL 113* 106  CO2 22 23  GLUCOSE 125* 144*  BUN 16 10  CREATININE 0.87 0.75  CALCIUM 8.3* 8.6*   PT/INR No results for input(s): LABPROT, INR in the last 72 hours. ABG No results for input(s): PHART, HCO3 in the last 72 hours.  Invalid input(s): PCO2, PO2  Studies/Results: No results found.  Anti-infectives: Anti-infectives (From admission, onward)   Start     Dose/Rate Route Frequency Ordered Stop   06/17/19 0100  piperacillin-tazobactam (ZOSYN) IVPB 3.375 g     3.375 g 12.5 mL/hr over 240 Minutes Intravenous Every 8 hours 06/16/19 1754     06/16/19 1800  piperacillin-tazobactam (ZOSYN) IVPB 3.375 g  Status:  Discontinued     3.375 g 100 mL/hr over 30 Minutes Intravenous  Once 06/16/19 1755 06/18/19 1400   06/16/19 1745  piperacillin-tazobactam (ZOSYN) IVPB 3.375 g  Status:  Discontinued     3.375 g 12.5 mL/hr over 240 Minutes Intravenous  Once  06/16/19 1742 06/16/19 1754      Assessment/Plan: s/p  sacral pressure area. Skin intact but could necrose a small area. Would treat like a burn with silvadene daily. Can follow up with wound clinic as outpt when stable for d/c  LOS: 3 days    Autumn Messing III 06/19/2019

## 2019-06-19 NOTE — Progress Notes (Signed)
[place]  Received request from [facility] for patient/family interest in [place] at home after discharge. Spoke with patient's spouse to confirm. Note plan for patient to return home Saturday. Contacted PCP Dr. Forde Dandy to make aware of this referral. Per spouse's request, Palliative Care Specialist will contact patient next week to arrange first visit.   Thank you,  Erling Conte, LCSW [phone]

## 2019-06-20 DIAGNOSIS — L89153 Pressure ulcer of sacral region, stage 3: Secondary | ICD-10-CM

## 2019-06-20 DIAGNOSIS — R700 Elevated erythrocyte sedimentation rate: Secondary | ICD-10-CM

## 2019-06-20 LAB — CBC WITH DIFFERENTIAL/PLATELET
Abs Immature Granulocytes: 0.02 10*3/uL (ref 0.00–0.07)
Basophils Absolute: 0 10*3/uL (ref 0.0–0.1)
Basophils Relative: 1 %
Eosinophils Absolute: 0.2 10*3/uL (ref 0.0–0.5)
Eosinophils Relative: 3 %
HCT: 34.4 % — ABNORMAL LOW (ref 36.0–46.0)
Hemoglobin: 11.4 g/dL — ABNORMAL LOW (ref 12.0–15.0)
Immature Granulocytes: 0 %
Lymphocytes Relative: 22 %
Lymphs Abs: 1.4 10*3/uL (ref 0.7–4.0)
MCH: 30.6 pg (ref 26.0–34.0)
MCHC: 33.1 g/dL (ref 30.0–36.0)
MCV: 92.2 fL (ref 80.0–100.0)
Monocytes Absolute: 0.4 10*3/uL (ref 0.1–1.0)
Monocytes Relative: 7 %
Neutro Abs: 4.4 10*3/uL (ref 1.7–7.7)
Neutrophils Relative %: 67 %
Platelets: 338 10*3/uL (ref 150–400)
RBC: 3.73 MIL/uL — ABNORMAL LOW (ref 3.87–5.11)
RDW: 11.6 % (ref 11.5–15.5)
WBC: 6.5 10*3/uL (ref 4.0–10.5)
nRBC: 0 % (ref 0.0–0.2)

## 2019-06-20 LAB — URINALYSIS, ROUTINE W REFLEX MICROSCOPIC
Bilirubin Urine: NEGATIVE
Glucose, UA: 50 mg/dL — AB
Hgb urine dipstick: NEGATIVE
Ketones, ur: NEGATIVE mg/dL
Leukocytes,Ua: NEGATIVE
Nitrite: NEGATIVE
Protein, ur: NEGATIVE mg/dL
Specific Gravity, Urine: 1.014 (ref 1.005–1.030)
pH: 7 (ref 5.0–8.0)

## 2019-06-20 LAB — C-REACTIVE PROTEIN: CRP: 16.9 mg/dL — ABNORMAL HIGH (ref <1.0)

## 2019-06-20 LAB — COMPREHENSIVE METABOLIC PANEL
ALT: 78 U/L — ABNORMAL HIGH (ref 0–44)
AST: 58 U/L — ABNORMAL HIGH (ref 15–41)
Albumin: 2.8 g/dL — ABNORMAL LOW (ref 3.5–5.0)
Alkaline Phosphatase: 130 U/L — ABNORMAL HIGH (ref 38–126)
Anion gap: 10 (ref 5–15)
BUN: 16 mg/dL (ref 8–23)
CO2: 27 mmol/L (ref 22–32)
Calcium: 9.1 mg/dL (ref 8.9–10.3)
Chloride: 101 mmol/L (ref 98–111)
Creatinine, Ser: 0.91 mg/dL (ref 0.44–1.00)
GFR calc Af Amer: >60 mL/min (ref >60)
GFR calc non Af Amer: >60 mL/min (ref >60)
Glucose, Bld: 166 mg/dL — ABNORMAL HIGH (ref 70–99)
Potassium: 3.4 mmol/L — ABNORMAL LOW (ref 3.5–5.1)
Sodium: 138 mmol/L (ref 135–145)
Total Bilirubin: 0.4 mg/dL (ref 0.3–1.2)
Total Protein: 7 g/dL (ref 6.5–8.1)

## 2019-06-20 LAB — MAGNESIUM: Magnesium: 1.9 mg/dL (ref 1.7–2.4)

## 2019-06-20 LAB — LACTIC ACID, PLASMA: Lactic Acid, Venous: 1.3 mmol/L (ref 0.5–1.9)

## 2019-06-20 LAB — SEDIMENTATION RATE: Sed Rate: 137 mm/hr — ABNORMAL HIGH (ref 0–22)

## 2019-06-20 MED ORDER — THIAMINE HCL 100 MG PO TABS: 100.0000 mg | ORAL_TABLET | Freq: Every day | ORAL | 0 refills | Status: AC

## 2019-06-20 MED ORDER — JUVEN PO PACK: 1.0000 | PACK | Freq: Two times a day (BID) | ORAL | 0 refills | Status: AC

## 2019-06-20 MED ORDER — LIDOCAINE 5 % EX PTCH: 1.0000 | MEDICATED_PATCH | CUTANEOUS | 0 refills | Status: AC

## 2019-06-20 MED ORDER — FOLIC ACID 1 MG PO TABS: 1.0000 mg | ORAL_TABLET | Freq: Every day | ORAL | 0 refills | Status: AC

## 2019-06-20 NOTE — Progress Notes (Signed)
Nsg Discharge Note  Admit Date:  06/16/2019 Discharge date: 06/20/2019   Casey Jennings to be D/C'd Home per MD order.  AVS completed.  Copy for chart, and copy for patient signed, and dated. Patient/caregiver able to verbalize understanding.  Discharge Medication: Allergies as of 06/20/2019      Reactions   Codeine    hallucinations      Medication List    TAKE these medications   amoxicillin-clavulanate 875-125 MG tablet Commonly known as: Augmentin Take 1 tablet by mouth 2 (two) times daily for 10 days.   aspirin 81 MG tablet Take 81 mg by mouth daily.   atorvastatin 80 MG tablet Commonly known as: LIPITOR Take 80 mg by mouth daily.   donepezil 10 MG tablet Commonly known as: ARICEPT TAKE 1 TABLET BY MOUTH EVERYDAY AT BEDTIME What changed: See the new instructions.   fluticasone 50 MCG/ACT nasal spray Commonly known as: FLONASE Place 2 sprays into both nostrils daily as needed for allergies.   folic acid 1 MG tablet Commonly known as: FOLVITE Take 1 tablet (1 mg total) by mouth daily.   lidocaine 5 % Commonly known as: LIDODERM Place 1 patch onto the skin daily. Remove & Discard patch within 12 hours or as directed by MD   memantine 10 MG tablet Commonly known as: NAMENDA TAKE 1 TABLET BY MOUTH TWICE A DAY   metoCLOPramide 10 MG tablet Commonly known as: REGLAN Take 10 mg by mouth 3 (three) times daily before meals.   mirtazapine 7.5 MG tablet Commonly known as: REMERON Take 7.5 mg by mouth daily.   multivitamin with minerals Tabs tablet Take 1 tablet by mouth daily.   nitroGLYCERIN 0.4 MG SL tablet Commonly known as: NITROSTAT Place 0.4 mg under the tongue every 5 (five) minutes as needed for chest pain.   nutrition supplement (JUVEN) Pack Take 1 packet by mouth 2 (two) times daily with a meal.   Probiotic 250 MG Caps Take 250 mg by mouth 2 (two) times daily for 15 days.   risperiDONE 0.5 MG tablet Commonly known as: RISPERDAL ONE IN THE  MORNING, 2 TABLETS AT NIGHT What changed: See the new instructions.   silver sulfADIAZINE 1 % cream Commonly known as: SILVADENE Apply topically daily.   thiamine 100 MG tablet Take 1 tablet (100 mg total) by mouth daily.            Durable Medical Equipment  (From admission, onward)         Start     Ordered   06/18/19 1332  For home use only DME 3 n 1  Once     06/18/19 1331          Discharge Assessment: Vitals:   06/20/19 0545 06/20/19 1413  BP: (!) 150/73 138/70  Pulse: 88 88  Resp: 16 16  Temp: 98.7 F (37.1 C) 98.5 F (36.9 C)  SpO2: 100% 99%   Sacral wound dressing changed. Wound care supplies provided to patient per MD instruction.  IV catheter discontinued intact. Site without signs and symptoms of complications - no redness or edema noted at insertion site, patient denies c/o pain - only slight tenderness at site.  Dressing with slight pressure applied.  D/c Instructions-Education: Discharge instructions given to patient/family with verbalized understanding. D/c education completed with patient/family including follow up instructions, medication list, d/c activities limitations if indicated, with other d/c instructions as indicated by MD - patient able to verbalize understanding, all questions fully answered. Patient instructed  to return to ED, call 911, or call MD for any changes in condition.  Patient escorted via [place], and D/C home via private auto.  Eustace Pen, RN 06/20/2019 5:00 PM

## 2019-06-20 NOTE — Discharge Summary (Addendum)
Discharge Summary  Casey Jennings YWV:[id number] DOB: [birth year]  PCP: Casey Bowen, MD  Admit date: 06/16/2019 Discharge date: 06/20/2019  Time spent: 24mns, more than 50% time spent on coordination of care.  Recommendations for Outpatient Follow-up:  1. F/u with PCP within a week  for hospital discharge follow up, repeat cbc/bmp at follow up 2. F/u with wound care center  3. F/u with community palliative care ([id number] [phone], may need to transition to home hospice if patient dose not improve. Husband expressed understanding 4. Home health  Discharge Diagnoses:  Active Hospital Problems   Diagnosis Date Noted  . Pressure injury of sacral region, stage 3 ([facility]   . Cellulitis of buttock   . ESR raised   . Palliative care by specialist   . Goals of care, counseling/discussion   . Malnutrition of moderate degree 06/17/2019  . Cellulitis of sacral region 06/16/2019  . Dementia with behavioral disturbance ([facility] 10/31/2017  . Memory loss 12/24/2016  . Gastroparesis due to DM ([facility] 04/11/2014  . Diabetes mellitus type 2 with complications ([facility] 009/38/1829   Resolved Hospital Problems  No resolved problems to display.    Discharge Condition: stable  Diet recommendation: diet as tolerated   Filed Weights   06/16/19 1131 06/17/19 0529 06/20/19 0545  Weight: 49.4 kg 49.4 kg 72.6 kg    History of present illness: (per admitting MD Dr Casey Jennings PCP: SReynold Bowen MD   Patient coming from: Home  Chief Complaint: Sacral Wound, Hypotension, Poor Po Intake   HPI: PKeriann Rankinis a 68y.o. female with medical history significant for marijuana due to chronic kidney disease stage III, depression and anxiety, diabetes mellitus with gastroparesis, GERD, hyperlipidemia, hypercalcemia, history of memory loss and Alzheimer's dementia with behavioral disturbances, history of UTIs and yeast infections, history of anemia, as well as other  comorbidities who presents with chief complaint of sacral wound that developed since last week.  Patient states that he developed last week sometime and gradually worsened.  Of note she is unable to provide a subjective history to her memory loss and has a difficult time remembering events.  No family is present at bedside but history is primarily taken from the patient and supplemented with the EDPs report.  She presented with an open wound to her sacral region in the superior left gluteal cleft and noticed soreness and pain on Friday with discomfort.  Patient is having pain in the area with hydrogen peroxide use nurse from the wound and presented to the family doctor this morning and there was concern of an abscess with some drainage she was sent to the emergency room because she is also to be somewhat hypotensive.  Husband reported that she has been eating less food and seems less energetic and states that she has lost several pounds and use it recently for both wounds per day and eats very little else.  She is evaluated and found to have a cellulitic area and [facility] was asked to admit this patient and general surgery was consulted.  Imaging did not show osteomyelitis with further imaging was obtained with an MRI which is still pending  ED Course: In the ED she had basic blood work done and was given a 500 mL fluid bolus.  General surgery was consulted and is x-ray of the sacrum was obtained as well as an MRI which is pending.  Covid testing was pending as well   Hospital Course:  Active Problems:  Diabetes mellitus type 2 with complications (HCC)   Gastroparesis due to DM (HCC)   Memory loss   Dementia with behavioral disturbance (HCC)   Cellulitis of sacral region   Malnutrition of moderate degree   Palliative care by specialist   Goals of care, counseling/discussion   Pressure injury of sacral region, stage 3 ([facility])   Cellulitis of buttock   ESR raised   Sacral /right buttock cellulitis,  likely with underline stage III pressure injury -per husband, patient has been mostly in bed during the day except occasionally getting up to the kitchen, right sided buttock appear red 5days ago prior to coming to the hospital then there is skin breakdown 4days ago prior to coming to the hospital -she favors laying on her right side -MRI pelvic showed cellulitis, no abscess, no osteomyelitis -blood culture no growth -general surgery and wound care input appreciated, recommends clear wound with soap and water, a saline moistened wet-to-dry dressing beneath the silicone foam-this will be changed twice daily due to wound depth. The foam can be reused for up to three days and changed PRN for soiling or rolling of dressing edges. -mattress replacement, pressure off loading measures, nutrition supplement -she is started on zosyn since admission, peri wound erythema has improved, there is superficial skin breakdown with a small area of necrosis, no fluctuant, no significant drainage ,no odor, general surgery Dr. Marlou Jennings input appreciated, he recommended "treat like a burn, placed Silvadene daily, follow-up with wound clinic." -Home health order placed, wound clinic  referral order placed She is discharged on augmentin.  Neck pain,  Does appear to have muscle spasm on left side of the neck, nontender on cervical spine, husband does not want to proceed with mri spine, he thinks patient's neck pain is related to patient's position laying in bed Improved on topical lidoderm and heat pack.  Transaminitis: H/o cholecytstectomy, prior ct ab reviewed, no liver and biliary tress abnormalitis -patient does not have ab pain, no n/v -Suspect transminitis is  from infection and dehydration, -lft fluctuation, overall appear improved    AKI on CKDII/azotemia -bun/cr on presentation is 37/1.15 -she appear dehydrated, bun/cr  Normalized with hydration  Normocytic anemia Appear chronic at baseline, hgb around 9  No acute blood loss  H/o insulin dependent dm2 with gastroparesis -has been off insulin due to significant weight loss and poor oral intake -am blood glucose range from 76 to 150, a1c 5.9 -home meds reglan continued  Moderate malnutrition in the contact of chronic illness, underweight Patient has poor appetite, has been most drinking boost at home, reports weight loss of 70lbs Body mass index is 18.71 kg/m.   FTT with dementia -she appears very weak,  husband does not want to snf placement, he declined PT service, will need home health RN for wound care  I am concerned that patient Overall  prognosis is poor due to continued declined, poor oral intake, weight loss. Mostly in bed. I have expressed my concern to patient's husband at bedside, husband wants to take patient's home, he is not interested in SNF, husband does not think patient can tolerate PT/OT. He confirmed patient has an established DNR and patient does not want feeding  Tube, husband appreciates inpatient palliative care services consult , he agreed to community palliative care service, case manager/social worker informed to arrange community palliative care service.   I discussed the possibility of needing to transition from palliative care to home hospice if patient continues to  deteriorate. Husband is receptive  to the information, he would like to take one step at a time.  DVT Prophylaxis while in the hospital: heparin subQ  Code Status: DNR  Family Communication: patient and husband at bedside  Disposition Plan: home with home health/community palliative care service   Consultants:  General surgery  Palliative care  Procedures:  none  Antibiotics:  zosyn   Discharge Exam: BP (!) 150/73 (BP Location: Right Arm)   Pulse 88   Temp 98.7 F (37.1 C) (Oral)   Resp 16   Ht '5\' 4"'$  (1.626 m)   Wt 72.6 kg   SpO2 100%   BMI 27.46 kg/m   General: sleepy, chronically ill appearing  Cardiovascular: RRR Respiratory: CTABL  Discharge Instructions You were cared for by a hospitalist during your hospital stay. If you have any questions about your discharge medications or the care you received while you were in the hospital after you are discharged, you can call the unit and asked to speak with the hospitalist on call if the hospitalist that took care of you is not available. Once you are discharged, your primary care physician will handle any further medical issues. Please note that NO REFILLS for any discharge medications will be authorized once you are discharged, as it is imperative that you return to your primary care physician (or establish a relationship with a primary care physician if you do not have one) for your aftercare needs so that they can reassess your need for medications and monitor your lab values.  Discharge Instructions    Ambulatory referral to Wound Clinic   Complete by: As directed    Diet general   Complete by: As directed    Increase activity slowly   Complete by: As directed      Allergies as of 06/20/2019      Reactions   Codeine    hallucinations      Medication List    TAKE these medications   amoxicillin-clavulanate 875-125 MG tablet Commonly known as: Augmentin Take 1 tablet by mouth 2 (two) times daily for 10 days.   aspirin 81 MG tablet Take 81 mg by mouth daily.   atorvastatin 80 MG tablet Commonly known as: LIPITOR Take 80 mg by mouth daily.   donepezil 10 MG tablet Commonly known as: ARICEPT TAKE 1 TABLET BY MOUTH EVERYDAY AT BEDTIME What changed: See the new instructions.   fluticasone 50 MCG/ACT nasal spray Commonly known as: FLONASE Place 2 sprays into both nostrils daily as needed for allergies.   folic acid 1 MG tablet Commonly known as: FOLVITE Take 1 tablet (1 mg total) by mouth daily.   lidocaine 5 % Commonly known as: LIDODERM Place 1 patch onto the skin daily. Remove & Discard patch within 12 hours or  as directed by MD   memantine 10 MG tablet Commonly known as: NAMENDA TAKE 1 TABLET BY MOUTH TWICE A DAY   metoCLOPramide 10 MG tablet Commonly known as: REGLAN Take 10 mg by mouth 3 (three) times daily before meals.   mirtazapine 7.5 MG tablet Commonly known as: REMERON Take 7.5 mg by mouth daily.   multivitamin with minerals Tabs tablet Take 1 tablet by mouth daily.   nitroGLYCERIN 0.4 MG SL tablet Commonly known as: NITROSTAT Place 0.4 mg under the tongue every 5 (five) minutes as needed for chest pain.   nutrition supplement (JUVEN) Pack Take 1 packet by mouth 2 (two) times daily with a meal.   Probiotic 250 MG Caps Take 250 mg  by mouth 2 (two) times daily for 15 days.   risperiDONE 0.5 MG tablet Commonly known as: RISPERDAL ONE IN THE MORNING, 2 TABLETS AT NIGHT What changed: See the new instructions.   silver sulfADIAZINE 1 % cream Commonly known as: SILVADENE Apply topically daily.   thiamine 100 MG tablet Take 1 tablet (100 mg total) by mouth daily.            Durable Medical Equipment  (From admission, onward)         Start     Ordered   06/18/19 1332  For home use only DME 3 n 1  Once     06/18/19 1331         Allergies  Allergen Reactions  . Codeine     hallucinations   Follow-up Information    Casey Bowen, MD Follow up in 1 week(s).   Specialty: Endocrinology Why: hospital discharge follow up, repeat cbc/bmp at follow up.  Contact information: [place] [address] [place] [facility]              Follow up in 1 week(s).   Contact information: [address] [address] [place] Follow up on 06/26/2019.   Why: They will provide RN and SW services in the home.  RN is scheduled to come out Friday, and will call you to confirm time.  Call them if you have not heard by Thursday Contact information:  [phone]           The results of significant diagnostics from this hospitalization (including imaging, microbiology, ancillary and laboratory) are listed below for reference.    Significant Diagnostic Studies: Dg Sacrum/coccyx  Result Date: 06/16/2019 CLINICAL DATA:  Sacral ulcer EXAM: SACRUM AND COCCYX - 2+ VIEW COMPARISON:  CT 11/06/2016 FINDINGS: Sacrum is partially obscured by overlying bowel gas on frontal view. Sacrum is well demonstrated on lateral view with preservation of the overlying posterior cortex. No cortical destruction or periostitis to suggest acute osteomyelitis. No contour deformity to suggest fracture. There is posterior and interbody fusion at L4-5. SI joints appear intact and unremarkable. IMPRESSION: No acute osseous abnormality. No radiographic evidence of acute osteomyelitis of the sacrum. Electronically Signed   By: Davina Poke M.D.   On: 06/16/2019 13:27   Mr Pelvis W SE Contrast  Result Date: 06/16/2019 CLINICAL DATA:  Sacral decubitus ulcer. EXAM: MRI PELVIS WITHOUT AND WITH CONTRAST TECHNIQUE: Multiplanar multisequence MR imaging of the pelvis was performed both before and after administration of intravenous contrast. CONTRAST:  10m GADAVIST GADOBUTROL 1 MMOL/ML IV SOLN COMPARISON:  Sacrum x-rays from same day. CT abdomen pelvis dated November 06, 2016. FINDINGS: Bones/Joint/Cartilage No marrow signal abnormality. No fracture or dislocation. Prior L4-L5 fusion. No joint effusion. Muscles and Tendons The bilateral gluteal, hamstring, and iliopsoas tendons are intact. No muscle edema. Lower paraspinous muscle atrophy. Soft tissue Mild superficial soft tissue swelling and enhancement over the sacrum and right buttock. No fluid collection or hematoma. No soft tissue mass. IMPRESSION: 1. Mild superficial soft tissue swelling and enhancement over the sacrum and right buttock, consistent with cellulitis. No abscess. 2. No osteomyelitis. Electronically Signed   By:  WTitus DubinM.D.   On: 06/16/2019 20:44   Dg Chest Portable 1 View  Result Date: 06/16/2019 CLINICAL DATA:  Hypotension EXAM: PORTABLE CHEST 1 VIEW COMPARISON:  06/02/2018 FINDINGS: The heart size and mediastinal contours are within normal limits. Calcific aortic knob. Both lungs are clear. The visualized skeletal structures are unremarkable. IMPRESSION: No active disease. Electronically Signed   By: Davina Poke M.D.   On: 06/16/2019 13:23    Microbiology: Recent Results (from the past 240 hour(s))  Blood culture (routine x 2)     Status: Abnormal   Collection Time: 06/16/19  3:04 PM   Specimen: Right Antecubital; Blood  Result Value Ref Range Status   Specimen Description   Final    RIGHT ANTECUBITAL Performed at [facility] [address] [place], [place] [address]    Special Requests   Final    BOTTLES DRAWN AEROBIC AND ANAEROBIC Blood Culture adequate volume Performed at [facility] [address], [place], [place] [address]    Culture  Setup Time   Final    GRAM POSITIVE COCCI AEROBIC BOTTLE ONLY CRITICAL RESULT CALLED TO, READ BACK BY AND VERIFIED WITH: J LEGGE PHARMD 1821 06/17/19 A BROWNING    Culture (A)  Final    STAPHYLOCOCCUS SPECIES (COAGULASE NEGATIVE) THE SIGNIFICANCE OF ISOLATING THIS ORGANISM FROM A SINGLE SET OF BLOOD CULTURES WHEN MULTIPLE SETS ARE DRAWN IS UNCERTAIN. PLEASE NOTIFY THE MICROBIOLOGY DEPARTMENT WITHIN ONE WEEK IF SPECIATION AND SENSITIVITIES ARE REQUIRED. Performed at [facility], [place] [address] [place], [place] [address]    Report Status 06/18/2019 FINAL  Final  SARS CORONAVIRUS 2 (TAT 6-24 HRS) Nasopharyngeal Nasopharyngeal Swab     Status: None   Collection Time: 06/16/19  3:04 PM   Specimen: Nasopharyngeal Swab  Result Value Ref Range Status   SARS Coronavirus 2 NEGATIVE NEGATIVE Final    Comment: (NOTE) SARS-CoV-2 target nucleic acids are NOT DETECTED. The SARS-CoV-2 RNA is  generally detectable in upper and lower respiratory specimens during the acute phase of infection. Negative results do not preclude SARS-CoV-2 infection, do not rule out co-infections with other pathogens, and should not be used as the sole basis for treatment or other patient management decisions. Negative results must be combined with clinical observations, patient history, and epidemiological information. The expected result is Negative. Fact Sheet for Patients: SugarRoll.be Fact Sheet for Healthcare Providers: https://www.woods-mathews.com/ This test is not yet approved or cleared by the [place] FDA and  has been authorized for detection and/or diagnosis of SARS-CoV-2 by FDA under an Emergency Use Authorization (EUA). This EUA will remain  in effect (meaning this test can be used) for the duration of the COVID-19 declaration under Section 56 4(b)(1) of the Act, 21 U.S.C. section 360bbb-3(b)(1), unless the authorization is terminated or revoked sooner. Performed at [facility], [place] [address] [place], [place] [address]   Blood culture (routine x 2)     Status: None (Preliminary result)   Collection Time: 06/16/19  3:53 PM   Specimen: BLOOD  Result Value Ref Range Status   Specimen Description   Final    BLOOD LEFT ANTECUBITAL Performed at [facility] [address], [place], [place] [address]    Special Requests   Final    BOTTLES DRAWN AEROBIC AND ANAEROBIC Blood Culture adequate volume Performed at [facility] [address] [place], [place] [address]    Culture   Final    NO GROWTH 3 DAYS Performed at [facility], [place] [address] [place], [place] [address]    Report Status PENDING  Incomplete  MRSA PCR Screening  Status: None   Collection Time: 06/18/19  9:22 AM   Specimen: Nasal Mucosa; Nasopharyngeal  Result Value Ref Range Status   MRSA by PCR NEGATIVE  NEGATIVE Final    Comment:        The GeneXpert MRSA Assay (FDA approved for NASAL specimens only), is one component of a comprehensive MRSA colonization surveillance program. It is not intended to diagnose MRSA infection nor to guide or monitor treatment for MRSA infections. Performed at [facility], [place] [address] [place], [place] [address]      Labs: Basic Metabolic Panel: Recent Labs  Lab 06/16/19 2024 06/17/19 0614 06/18/19 0618 06/19/19 0534 06/20/19 0544  NA 143 145 144 139 138  K 3.5 3.6 3.2* 3.5 3.4*  CL 112* 115* 113* 106 101  CO2 _0 GLUCOSE 161* 150* 125* 144* 166*  BUN 32* 26* _1 CREATININE 0.95 0.92 0.87 0.75 0.91  CALCIUM 9.0 9.0 8.3* 8.6* 9.1  MG  --   --   --  1.6* 1.9   Liver Function Tests: Recent Labs  Lab 06/16/19 2024 06/17/19 0614 06/18/19 0618 06/19/19 0534 06/20/19 0544  AST 70* 66* 49* 37 58*  ALT 103* 95* 77* 64* 78*  ALKPHOS 83 81 86 100 130*  BILITOT 0.7 0.7 0.7 0.7 0.4  PROT 6.2* 5.7* 5.6* 5.9* 7.0  ALBUMIN 3.0* 2.7* 2.5* 2.5* 2.8*   No results for input(s): LIPASE, AMYLASE in the last 168 hours. No results for input(s): AMMONIA in the last 168 hours. CBC: Recent Labs  Lab 06/16/19 1149 06/16/19 2024 06/17/19 0614 06/18/19 0618 06/20/19 0544  WBC 9.2 7.5 6.5 7.3 6.5  NEUTROABS  --   --   --  5.4 4.4  HGB 11.1* 9.6* 9.0* 9.3* 11.4*  HCT 34.6* 31.3* 27.5* 27.8* 34.4*  MCV 94.8 99.1 94.8 90.8 92.2  PLT 241 216 213 263 338   Cardiac Enzymes: No results for input(s): CKTOTAL, CKMB, CKMBINDEX, TROPONINI in the last 168 hours. BNP: BNP (last 3 results) No results for input(s): BNP in the last 8760 hours.  ProBNP (last 3 results) No results for input(s): PROBNP in the last 8760 hours.  CBG: Recent Labs  Lab 06/17/19 0801 06/17/19 1156 06/18/19 0747 06/19/19 0754  GLUCAP 133* 122* 118* 136*       Signed:  Florencia Reasons MD, PhD, FACP  [facility] 06/20/2019, 10:40  AM

## 2019-06-21 LAB — CULTURE, BLOOD (ROUTINE X 2)
Culture: NO GROWTH
Special Requests: ADEQUATE

## 2019-06-29 ENCOUNTER — Other Ambulatory Visit: Payer: Self-pay

## 2019-06-29 ENCOUNTER — Ambulatory Visit (INDEPENDENT_AMBULATORY_CARE_PROVIDER_SITE_OTHER): Payer: PPO | Admitting: Podiatry

## 2019-06-29 ENCOUNTER — Encounter: Payer: Self-pay | Admitting: Podiatry

## 2019-06-29 DIAGNOSIS — B351 Tinea unguium: Secondary | ICD-10-CM | POA: Diagnosis not present

## 2019-06-29 DIAGNOSIS — M79674 Pain in right toe(s): Secondary | ICD-10-CM | POA: Diagnosis not present

## 2019-06-29 DIAGNOSIS — M79675 Pain in left toe(s): Secondary | ICD-10-CM

## 2019-06-29 NOTE — Patient Instructions (Signed)
Diabetes Mellitus and Foot Care Foot care is an important part of your health, especially when you have diabetes. Diabetes may cause you to have problems because of poor blood flow (circulation) to your feet and legs, which can cause your skin to:  Become thinner and drier.  Break more easily.  Heal more slowly.  Peel and crack. You may also have nerve damage (neuropathy) in your legs and feet, causing decreased feeling in them. This means that you may not notice minor injuries to your feet that could lead to more serious problems. Noticing and addressing any potential problems early is the best way to prevent future foot problems. How to care for your feet Foot hygiene  Wash your feet daily with warm water and mild soap. Do not use hot water. Then, pat your feet and the areas between your toes until they are completely dry. Do not soak your feet as this can dry your skin.  Trim your toenails straight across. Do not dig under them or around the cuticle. File the edges of your nails with an emery board or nail file.  Apply a moisturizing lotion or petroleum jelly to the skin on your feet and to dry, brittle toenails. Use lotion that does not contain alcohol and is unscented. Do not apply lotion between your toes. Shoes and socks  Wear clean socks or stockings every day. Make sure they are not too tight. Do not wear knee-high stockings since they may decrease blood flow to your legs.  Wear shoes that fit properly and have enough cushioning. Always look in your shoes before you put them on to be sure there are no objects inside.  To break in new shoes, wear them for just a few hours a day. This prevents injuries on your feet. Wounds, scrapes, corns, and calluses  Check your feet daily for blisters, cuts, bruises, sores, and redness. If you cannot see the bottom of your feet, use a mirror or ask someone for help.  Do not cut corns or calluses or try to remove them with medicine.  If you  find a minor scrape, cut, or break in the skin on your feet, keep it and the skin around it clean and dry. You may clean these areas with mild soap and water. Do not clean the area with peroxide, alcohol, or iodine.  If you have a wound, scrape, corn, or callus on your foot, look at it several times a day to make sure it is healing and not infected. Check for: ? Redness, swelling, or pain. ? Fluid or blood. ? Warmth. ? Pus or a bad smell. General instructions  Do not cross your legs. This may decrease blood flow to your feet.  Do not use heating pads or hot water bottles on your feet. They may burn your skin. If you have lost feeling in your feet or legs, you may not know this is happening until it is too late.  Protect your feet from hot and cold by wearing shoes, such as at the beach or on hot pavement.  Schedule a complete foot exam at least once a year (annually) or more often if you have foot problems. If you have foot problems, report any cuts, sores, or bruises to your health care provider immediately. Contact a health care provider if:  You have a medical condition that increases your risk of infection and you have any cuts, sores, or bruises on your feet.  You have an injury that is not   healing.  You have redness on your legs or feet.  You feel burning or tingling in your legs or feet.  You have pain or cramps in your legs and feet.  Your legs or feet are numb.  Your feet always feel cold.  You have pain around a toenail. Get help right away if:  You have a wound, scrape, corn, or callus on your foot and: ? You have pain, swelling, or redness that gets worse. ? You have fluid or blood coming from the wound, scrape, corn, or callus. ? Your wound, scrape, corn, or callus feels warm to the touch. ? You have pus or a bad smell coming from the wound, scrape, corn, or callus. ? You have a fever. ? You have a red line going up your leg. Summary  Check your feet every day  for cuts, sores, red spots, swelling, and blisters.  Moisturize feet and legs daily.  Wear shoes that fit properly and have enough cushioning.  If you have foot problems, report any cuts, sores, or bruises to your health care provider immediately.  Schedule a complete foot exam at least once a year (annually) or more often if you have foot problems. This information is not intended to replace advice given to you by your health care provider. Make sure you discuss any questions you have with your health care provider. Document Released: 07/06/2000 Document Revised: 08/21/2017 Document Reviewed: 08/10/2016 Elsevier Patient Education  2020 [employer]  

## 2019-07-05 NOTE — Progress Notes (Signed)
Subjective:  Casey Jennings presents to clinic today with cc of  painful, thick, discolored, elongated toenails  of both feet that become tender and patient cannot cut because of thickness. Pain is aggravated when wearing enclosed shoe gear and relieved with periodic professional debridement.  Patient voices no new pedal concerns on today's visit.  Medications reviewed in chart.  Allergies  Allergen Reactions  . Codeine     hallucinations     Objective:  Physical Examination:  Vascular Examination: Capillary refill time immediate b/l.  Palpable DP/PT pulses b/l.  Digital hair absent b/l.   No edema noted b/l.  Skin temperature gradient WNL b/l.  Dermatological Examination: Skin with normal turgor, texture and tone b/l.  No open wounds b/l.  No interdigital macerations noted b/l.  Elongated, thick, discolored brittle toenails with subungual debris and pain on dorsal palpation of nailbeds 1-5 b/l.  Musculoskeletal Examination: Muscle strength 5/5 to all muscle groups b/l.  No pain, crepitus or joint discomfort with active/passive ROM.  Neurological Examination: Patient unable to comply with commands of neurological examination due to cognition status, but does respond to external noxious stimuli.  Assessment: Mycotic nail infection with pain 1-5 b/l  Plan: 1. Toenails 1-5 b/l were debrided in length and girth without iatrogenic laceration. 2.  Continue soft, supportive shoe gear daily. 3.  Report any pedal injuries to medical professional. 4.  Follow up 3 months. 5.  Patient/POA to call should there be a question/concern in there interim.

## 2019-09-03 DIAGNOSIS — N184 Chronic kidney disease, stage 4 (severe): Secondary | ICD-10-CM | POA: Diagnosis not present

## 2019-09-03 DIAGNOSIS — F0390 Unspecified dementia without behavioral disturbance: Secondary | ICD-10-CM | POA: Diagnosis not present

## 2019-09-03 DIAGNOSIS — E1129 Type 2 diabetes mellitus with other diabetic kidney complication: Secondary | ICD-10-CM | POA: Diagnosis not present

## 2019-09-09 DIAGNOSIS — E1129 Type 2 diabetes mellitus with other diabetic kidney complication: Secondary | ICD-10-CM | POA: Diagnosis not present

## 2019-09-17 DIAGNOSIS — I10 Essential (primary) hypertension: Secondary | ICD-10-CM | POA: Diagnosis not present

## 2019-09-17 DIAGNOSIS — Z1331 Encounter for screening for depression: Secondary | ICD-10-CM | POA: Diagnosis not present

## 2019-09-17 DIAGNOSIS — N08 Glomerular disorders in diseases classified elsewhere: Secondary | ICD-10-CM | POA: Diagnosis not present

## 2019-09-17 DIAGNOSIS — K222 Esophageal obstruction: Secondary | ICD-10-CM | POA: Diagnosis not present

## 2019-09-17 DIAGNOSIS — E11319 Type 2 diabetes mellitus with unspecified diabetic retinopathy without macular edema: Secondary | ICD-10-CM | POA: Diagnosis not present

## 2019-09-17 DIAGNOSIS — I700 Atherosclerosis of aorta: Secondary | ICD-10-CM | POA: Diagnosis not present

## 2019-09-17 DIAGNOSIS — E039 Hypothyroidism, unspecified: Secondary | ICD-10-CM | POA: Diagnosis not present

## 2019-09-17 DIAGNOSIS — D631 Anemia in chronic kidney disease: Secondary | ICD-10-CM | POA: Diagnosis not present

## 2019-09-17 DIAGNOSIS — F339 Major depressive disorder, recurrent, unspecified: Secondary | ICD-10-CM | POA: Diagnosis not present

## 2019-09-17 DIAGNOSIS — E785 Hyperlipidemia, unspecified: Secondary | ICD-10-CM | POA: Diagnosis not present

## 2019-09-17 DIAGNOSIS — F0390 Unspecified dementia without behavioral disturbance: Secondary | ICD-10-CM | POA: Diagnosis not present

## 2019-09-17 DIAGNOSIS — E1129 Type 2 diabetes mellitus with other diabetic kidney complication: Secondary | ICD-10-CM | POA: Diagnosis not present

## 2019-09-17 DIAGNOSIS — L89156 Pressure-induced deep tissue damage of sacral region: Secondary | ICD-10-CM | POA: Diagnosis not present

## 2019-09-19 ENCOUNTER — Other Ambulatory Visit: Payer: Self-pay | Admitting: Neurology

## 2019-09-23 DIAGNOSIS — E7849 Other hyperlipidemia: Secondary | ICD-10-CM | POA: Diagnosis not present

## 2019-09-28 ENCOUNTER — Ambulatory Visit: Payer: PPO | Admitting: Podiatry

## 2019-09-28 ENCOUNTER — Encounter: Payer: Self-pay | Admitting: Podiatry

## 2019-09-28 ENCOUNTER — Other Ambulatory Visit: Payer: Self-pay

## 2019-09-28 DIAGNOSIS — B351 Tinea unguium: Secondary | ICD-10-CM | POA: Diagnosis not present

## 2019-09-28 DIAGNOSIS — M79674 Pain in right toe(s): Secondary | ICD-10-CM | POA: Diagnosis not present

## 2019-09-28 DIAGNOSIS — E118 Type 2 diabetes mellitus with unspecified complications: Secondary | ICD-10-CM

## 2019-09-28 DIAGNOSIS — M79675 Pain in left toe(s): Secondary | ICD-10-CM

## 2019-09-28 NOTE — Patient Instructions (Signed)
Diabetes Mellitus and Foot Care Foot care is an important part of your health, especially when you have diabetes. Diabetes may cause you to have problems because of poor blood flow (circulation) to your feet and legs, which can cause your skin to:  Become thinner and drier.  Break more easily.  Heal more slowly.  Peel and crack. You may also have nerve damage (neuropathy) in your legs and feet, causing decreased feeling in them. This means that you may not notice minor injuries to your feet that could lead to more serious problems. Noticing and addressing any potential problems early is the best way to prevent future foot problems. How to care for your feet Foot hygiene  Wash your feet daily with warm water and mild soap. Do not use hot water. Then, pat your feet and the areas between your toes until they are completely dry. Do not soak your feet as this can dry your skin.  Trim your toenails straight across. Do not dig under them or around the cuticle. File the edges of your nails with an emery board or nail file.  Apply a moisturizing lotion or petroleum jelly to the skin on your feet and to dry, brittle toenails. Use lotion that does not contain alcohol and is unscented. Do not apply lotion between your toes. Shoes and socks  Wear clean socks or stockings every day. Make sure they are not too tight. Do not wear knee-high stockings since they may decrease blood flow to your legs.  Wear shoes that fit properly and have enough cushioning. Always look in your shoes before you put them on to be sure there are no objects inside.  To break in new shoes, wear them for just a few hours a day. This prevents injuries on your feet. Wounds, scrapes, corns, and calluses  Check your feet daily for blisters, cuts, bruises, sores, and redness. If you cannot see the bottom of your feet, use a mirror or ask someone for help.  Do not cut corns or calluses or try to remove them with medicine.  If you  find a minor scrape, cut, or break in the skin on your feet, keep it and the skin around it clean and dry. You may clean these areas with mild soap and water. Do not clean the area with peroxide, alcohol, or iodine.  If you have a wound, scrape, corn, or callus on your foot, look at it several times a day to make sure it is healing and not infected. Check for: ? Redness, swelling, or pain. ? Fluid or blood. ? Warmth. ? Pus or a bad smell. General instructions  Do not cross your legs. This may decrease blood flow to your feet.  Do not use heating pads or hot water bottles on your feet. They may burn your skin. If you have lost feeling in your feet or legs, you may not know this is happening until it is too late.  Protect your feet from hot and cold by wearing shoes, such as at the beach or on hot pavement.  Schedule a complete foot exam at least once a year (annually) or more often if you have foot problems. If you have foot problems, report any cuts, sores, or bruises to your health care provider immediately. Contact a health care provider if:  You have a medical condition that increases your risk of infection and you have any cuts, sores, or bruises on your feet.  You have an injury that is not   healing.  You have redness on your legs or feet.  You feel burning or tingling in your legs or feet.  You have pain or cramps in your legs and feet.  Your legs or feet are numb.  Your feet always feel cold.  You have pain around a toenail. Get help right away if:  You have a wound, scrape, corn, or callus on your foot and: ? You have pain, swelling, or redness that gets worse. ? You have fluid or blood coming from the wound, scrape, corn, or callus. ? Your wound, scrape, corn, or callus feels warm to the touch. ? You have pus or a bad smell coming from the wound, scrape, corn, or callus. ? You have a fever. ? You have a red line going up your leg. Summary  Check your feet every day  for cuts, sores, red spots, swelling, and blisters.  Moisturize feet and legs daily.  Wear shoes that fit properly and have enough cushioning.  If you have foot problems, report any cuts, sores, or bruises to your health care provider immediately.  Schedule a complete foot exam at least once a year (annually) or more often if you have foot problems. This information is not intended to replace advice given to you by your health care provider. Make sure you discuss any questions you have with your health care provider. Document Revised: 04/01/2019 Document Reviewed: 08/10/2016 [employer] Patient Education  2020 [employer]  

## 2019-10-05 NOTE — Progress Notes (Signed)
Subjective: Casey Jennings presents today for follow up of preventative diabetic foot care and painful mycotic nails b/l that are difficult to trim. Pain interferes with ambulation. Aggravating factors include wearing enclosed shoe gear. Pain is relieved with periodic professional debridement.   Patient has diagnosis of Alzheimer's dementia. Husband is present during the visit.  Allergies  Allergen Reactions  . Codeine     hallucinations    Objective: There were no vitals filed for this visit.  Pt 69 y.o. year old Caucasian female WD, WN in NAD.  Vascular Examination:  Capillary refill time to digits immediate b/l. Palpable DP pulses b/l. Palpable PT pulses b/l. Pedal hair absent b/l Skin temperature gradient within normal limits b/l.  Dermatological Examination: Pedal skin with normal turgor, texture and tone bilaterally. No open wounds bilaterally. Toenails 1-5 b/l elongated, dystrophic, thickened, crumbly with subungual debris and tenderness to dorsal palpation.  Musculoskeletal: Normal muscle strength 5/5 to all lower extremity muscle groups bilaterally, no gross bony deformities bilaterally and no pain crepitus or joint limitation noted with ROM b/l  Neurological: Patient unable to comply with commands of neurological examination due to cognition. She does respond to external noxious stimuli.  Assessment: 1. Pain due to onychomycosis of toenails of both feet   2. Diabetes mellitus type 2 with complications ([place])    Plan: -Continue diabetic foot care principles. Literature dispensed on today.  -Toenails 1-5 b/l were debrided in length and girth with sterile nail nippers and dremel without iatrogenic bleeding.  -Patient to continue soft, supportive shoe gear daily. -Patient to report any pedal injuries to medical professional immediately. -Patient/POA to call should there be question/concern in the interim.  Return in about 3 months (around 12/29/2019) for diabetic nail  trim.

## 2019-11-04 DIAGNOSIS — N390 Urinary tract infection, site not specified: Secondary | ICD-10-CM | POA: Diagnosis not present

## 2019-11-05 DIAGNOSIS — N390 Urinary tract infection, site not specified: Secondary | ICD-10-CM | POA: Diagnosis not present

## 2019-12-02 DIAGNOSIS — E1129 Type 2 diabetes mellitus with other diabetic kidney complication: Secondary | ICD-10-CM | POA: Diagnosis not present

## 2019-12-02 DIAGNOSIS — F0390 Unspecified dementia without behavioral disturbance: Secondary | ICD-10-CM | POA: Diagnosis not present

## 2019-12-02 DIAGNOSIS — N182 Chronic kidney disease, stage 2 (mild): Secondary | ICD-10-CM | POA: Diagnosis not present

## 2019-12-02 DIAGNOSIS — N390 Urinary tract infection, site not specified: Secondary | ICD-10-CM | POA: Diagnosis not present

## 2020-01-18 DIAGNOSIS — Z1331 Encounter for screening for depression: Secondary | ICD-10-CM | POA: Diagnosis not present

## 2020-01-18 DIAGNOSIS — N182 Chronic kidney disease, stage 2 (mild): Secondary | ICD-10-CM | POA: Diagnosis not present

## 2020-01-18 DIAGNOSIS — I10 Essential (primary) hypertension: Secondary | ICD-10-CM | POA: Diagnosis not present

## 2020-01-18 DIAGNOSIS — I700 Atherosclerosis of aorta: Secondary | ICD-10-CM | POA: Diagnosis not present

## 2020-01-18 DIAGNOSIS — D631 Anemia in chronic kidney disease: Secondary | ICD-10-CM | POA: Diagnosis not present

## 2020-01-18 DIAGNOSIS — E038 Other specified hypothyroidism: Secondary | ICD-10-CM | POA: Diagnosis not present

## 2020-01-18 DIAGNOSIS — F0390 Unspecified dementia without behavioral disturbance: Secondary | ICD-10-CM | POA: Diagnosis not present

## 2020-01-18 DIAGNOSIS — R627 Adult failure to thrive: Secondary | ICD-10-CM | POA: Diagnosis not present

## 2020-01-18 DIAGNOSIS — E7849 Other hyperlipidemia: Secondary | ICD-10-CM | POA: Diagnosis not present

## 2020-01-18 DIAGNOSIS — L89156 Pressure-induced deep tissue damage of sacral region: Secondary | ICD-10-CM | POA: Diagnosis not present

## 2020-01-18 DIAGNOSIS — E1129 Type 2 diabetes mellitus with other diabetic kidney complication: Secondary | ICD-10-CM | POA: Diagnosis not present

## 2020-03-01 DIAGNOSIS — E1021 Type 1 diabetes mellitus with diabetic nephropathy: Secondary | ICD-10-CM | POA: Diagnosis not present

## 2020-03-01 DIAGNOSIS — N182 Chronic kidney disease, stage 2 (mild): Secondary | ICD-10-CM | POA: Diagnosis not present

## 2020-03-01 DIAGNOSIS — E1129 Type 2 diabetes mellitus with other diabetic kidney complication: Secondary | ICD-10-CM | POA: Diagnosis not present

## 2020-03-01 DIAGNOSIS — I10 Essential (primary) hypertension: Secondary | ICD-10-CM | POA: Diagnosis not present

## 2020-03-01 DIAGNOSIS — F0281 Dementia in other diseases classified elsewhere with behavioral disturbance: Secondary | ICD-10-CM | POA: Diagnosis not present

## 2020-07-23 DEATH — deceased
